# Patient Record
Sex: Female | Born: 2000 | Hispanic: No | Marital: Married | Smoking: Never smoker
Health system: Southern US, Community
[De-identification: ages and names within clinical notes are randomized; demographics above are authoritative.]

## PROBLEM LIST (undated history)

## (undated) DIAGNOSIS — R519 Headache, unspecified: Secondary | ICD-10-CM

## (undated) HISTORY — PX: WISDOM TOOTH EXTRACTION: SHX21

---
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
## 2020-01-14 ENCOUNTER — Ambulatory Visit: Payer: Self-pay | Admitting: Family Medicine

## 2020-01-28 ENCOUNTER — Other Ambulatory Visit: Payer: Self-pay

## 2020-01-28 ENCOUNTER — Encounter: Payer: Self-pay | Admitting: Family Medicine

## 2020-01-28 ENCOUNTER — Ambulatory Visit (INDEPENDENT_AMBULATORY_CARE_PROVIDER_SITE_OTHER): Payer: PRIVATE HEALTH INSURANCE | Admitting: Family Medicine

## 2020-01-28 VITALS — BP 106/73 | HR 90 | Ht 60.0 in | Wt 157.0 lb

## 2020-01-28 DIAGNOSIS — M25562 Pain in left knee: Secondary | ICD-10-CM | POA: Diagnosis not present

## 2020-01-28 DIAGNOSIS — Z0000 Encounter for general adult medical examination without abnormal findings: Secondary | ICD-10-CM | POA: Diagnosis not present

## 2020-01-28 DIAGNOSIS — G43709 Chronic migraine without aura, not intractable, without status migrainosus: Secondary | ICD-10-CM | POA: Diagnosis not present

## 2020-01-28 DIAGNOSIS — M25561 Pain in right knee: Secondary | ICD-10-CM

## 2020-01-28 DIAGNOSIS — G8929 Other chronic pain: Secondary | ICD-10-CM | POA: Insufficient documentation

## 2020-01-28 NOTE — Progress Notes (Signed)
Office Visit Note   Patient: Kathryn Anderson           Date of Birth: 02/22/2001           MRN: [id number] Visit Date: 01/28/2020 Requested by: No referring provider defined for this encounter. PCP: Kathryn Mesi, MD  Subjective: Chief Complaint  Patient presents with   establish primary care    HPI: She is here for a wellness exam.  She and her parents moved here from [place].  Her father works for [employer].  She has a history of migraines for the past 4 to 5 years.  She has them about four or five times a month.  She takes ibuprofen occasionally, he usually she can sleep them off.  She has not noticed any dietary triggers.  She takes a vitamin supplement for migraines.  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  She has chronic bilateral knee pain, right greater than left.  Pain is anterior, when she squats down or comes back up again.  No locking or giving way.  Symptoms for at least a year.  She has had at least one concussion, she fully recovered from it.  Family history was reviewed in detail.  She has not yet been to a gynecologist.                ROS:   All other systems were reviewed and are negative.  Objective: Vital Signs: BP 106/73    Pulse 90    Ht 5' (1.524 m)    Wt 157 lb (71.2 kg)    BMI 30.66 kg/m   Physical Exam:  General:  Alert and oriented, in no acute distress. Pulm:  Breathing unlabored. Psy:  Normal mood, congruent affect.  HEENT:  [place]/AT, PERRLA, EOM Full, no nystagmus.  Funduscopic examination within normal limits.  No conjunctival erythema.  Tympanic membranes are pearly gray with normal landmarks.  External ear canals are normal.  Nasal passages are clear.  Oropharynx is clear.  No significant lymphadenopathy.  No thyromegaly or nodules.  2+ carotid pulses without bruits. CV: Regular rate and rhythm without murmurs, rubs, or gallops.  No peripheral edema.  2+ radial and posterior tibial  pulses. Lungs: Clear to auscultation throughout with no wheezing or areas of consolidation. Abd: Bowel sounds are active, no hepatosplenomegaly or masses.  Soft and nontender.  No audible bruits.  No evidence of ascites. Extremities: 2+ upper and lower DTRs.  No nail deformities.  Slightly wide bilateral to ankles, no pain with internal hip rotation.  Hypermobile patella on both sides with 1+ patellofemoral crepitus in both knees.  No effusion, ligaments are stable.  Feet have hyperpronation with pes planus.    Imaging: No results found.  Assessment & Plan: 1.  Wellness exam -Gynecology referral at age 19. -Labs if she starts having more frequent migraines.  2.  Migraine headaches -Food diary, consider low carbohydrate/ketogenic diet.  3.  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  Arch supports in her shoes.  Physical therapy if symptoms worsen.     Procedures: No procedures performed  No notes on file     PMFS History: Patient Active Problem List   Diagnosis Date Noted   Chronic migraine without aura without status migrainosus, not intractable 01/28/2020   Chronic pain of both knees 01/28/2020   Ankle ligament laxity, left 12/19/2019   History reviewed. No pertinent past medical  history.  Family History  Problem Relation Age of Onset   Healthy Mother    Healthy Father    Macular degeneration Maternal Grandmother    Glaucoma Maternal Grandmother    Dementia Paternal Grandfather    Heart disease Neg Hx     History reviewed. No pertinent surgical history. Social History   Occupational History   Not on file  Tobacco Use   Smoking status: Never Smoker   Smokeless tobacco: Never Used  Substance and Sexual Activity   Alcohol use: Never   Drug use: Never   Sexual activity: Not on file

## 2020-02-03 ENCOUNTER — Ambulatory Visit: Payer: PRIVATE HEALTH INSURANCE | Admitting: Physical Therapy

## 2020-03-02 ENCOUNTER — Ambulatory Visit: Payer: PRIVATE HEALTH INSURANCE | Attending: Orthopaedic Surgery | Admitting: Physical Therapy

## 2020-03-02 ENCOUNTER — Other Ambulatory Visit: Payer: Self-pay

## 2020-03-02 DIAGNOSIS — M25572 Pain in left ankle and joints of left foot: Secondary | ICD-10-CM | POA: Diagnosis not present

## 2020-03-02 DIAGNOSIS — R29898 Other symptoms and signs involving the musculoskeletal system: Secondary | ICD-10-CM | POA: Diagnosis present

## 2020-03-02 DIAGNOSIS — M25672 Stiffness of left ankle, not elsewhere classified: Secondary | ICD-10-CM | POA: Diagnosis present

## 2020-03-02 DIAGNOSIS — M6281 Muscle weakness (generalized): Secondary | ICD-10-CM | POA: Insufficient documentation

## 2020-03-02 NOTE — Patient Instructions (Signed)
    Home exercise program created by Marion Seese, PT.  For questions, please contact Devontre Siedschlag via phone at [phone] or email at [email or web address]  [facility] [place] [address]  [address] [place], [place], [address] Phone: [phone]   Fax:  [phone]    

## 2020-03-02 NOTE — Therapy (Addendum)
[facility] [address]  [address] [place], [place], [address] Phone: [phone]   Fax:  [phone]  Physical Therapy Evaluation  Patient Details  Name: Kathryn Anderson MRN: [id number] Date of Birth: 2001/03/07 Referring Provider (PT): Norlene Campbell, MD   Encounter Date: 03/02/2020   PT End of Session - 03/02/20 1532    Visit Number 1    Number of Visits 8    Date for PT Re-Evaluation 03/30/20    Authorization Type [employer]    PT Start Time 1532    PT Stop Time 1622    PT Time Calculation (min) 50 min    Activity Tolerance Patient tolerated treatment well    Behavior During Therapy [facility] for tasks assessed/performed           No past medical history on file.  No past surgical history on file.  There were no vitals filed for this visit.    Subjective Assessment - 03/02/20 1534    Subjective Pt reports problems with L ankle - in past 4 yrs, she has sprained her ankle ~8 times with most recent sprain 3-4 months ago. Sprains have typically occurred while playing volleyball (currently plays 3x/wk) - usually indoors but occasionally on sand. Majority if not all sprains into inversion/supination. No pain currently but notes weakness in L ankle and popping on L single leg heel raise.    Patient Stated Goals "to keep from spraining her L ankle"    Currently in Pain? 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  No    Is the patient reluctant to leave their home because of a fear of falling?  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    Leisure volleyball - 3x/wk; gym 3-4x/wk (cardio & [employer]); play violin      Cognition   Overall Cognitive Status Within Functional Limits for tasks assessed      Observation/Other Assessments   Focus on Therapeutic Outcomes (FOTO)  Ankle - 72% (28% limitation); Predicted 84% (16% limitation)      Sensation   Light Touch Appears Intact      ROM / Strength   AROM / PROM / Strength AROM;PROM;Strength      AROM   AROM  Assessment Site Ankle    Right/Left Ankle Right;Left    Right Ankle Dorsiflexion 10    Right Ankle Plantar Flexion 64    Right Ankle Inversion 37    Right Ankle Eversion 13    Left Ankle Dorsiflexion 11    Left Ankle Plantar Flexion 55    Left Ankle Inversion 35    Left Ankle Eversion 10      PROM   PROM Assessment Site Ankle    Right/Left Ankle Right;Left    Right Ankle Eversion 23    Left Ankle Eversion 15      Strength   Strength Assessment Site Ankle;Knee;Hip    Right/Left Hip Right;Left    Right Hip Flexion 5/5    Right Hip Extension 5/5    Right Hip External Rotation  4+/5    Right Hip Internal Rotation 4+/5    Right Hip ABduction 5/5    Right Hip ADduction 5/5    Left Hip Flexion 4+/5    Left Hip Extension 4+/5    Left Hip External Rotation 4/5    Left Hip Internal Rotation 4+/5    Left Hip ABduction 4/5    Left Hip ADduction 4/5    Right/Left Knee Right;Left    Right Knee Flexion 5/5    Right Knee Extension 4+/5   pain at  patellar tendon   Left Knee Flexion 5/5    Left Knee Extension 4+/5   pain at patellar tendon   Right/Left Ankle Right;Left    Right Ankle Dorsiflexion 4/5    Right Ankle Plantar Flexion 5/5    Right Ankle Inversion 5/5    Right Ankle Eversion 5/5    Left Ankle Dorsiflexion 4-/5    Left Ankle Plantar Flexion 3+/5   "popping"   Left Ankle Inversion 4-/5    Left Ankle Eversion 3+/5      Flexibility   Soft Tissue Assessment /Muscle Length yes    Hamstrings mild tight B    Quadriceps WFL    ITB mild tight L    Piriformis WFL                      Objective measurements completed on examination: See above findings.               PT Education - 03/02/20 1620    Education Details PT eval findings, anticipated POC & initial HEP    Person(s) Educated Patient    Methods Explanation;Demonstration;Verbal cues;Handout    Comprehension Verbalized understanding;Verbal cues required;Returned demonstration;Need further instruction               PT Long Term Goals - 03/02/20 1622      PT LONG TERM GOAL #1   Title Patient will be independent with ongoing/advanced HEP +/- gym program for self-management at home    Status New    Target Date 03/30/20      PT LONG TERM GOAL #2   Title Patient will demonstrate improved L ankle & proximal LE strength to >/= 5-/5 for improved stability and ease of mobility    Status New    Target Date 03/30/20      PT LONG TERM GOAL #3   Title Patient will report no limitation with floor transfers at work due L ankle laxity/weakness    Status New    Target Date 03/30/20      PT LONG TERM GOAL #4   Title Patient will report no further instances of  L ankle instability/sprains while playing volleyball    Status New    Target Date 03/30/20                  Plan - 03/02/20 1622    Clinical Impression Statement [name] is a 19 y/o female who presents to OP PT for chronic L ankle ligament laxity due h/o multiple ankle  sprains. She reports ~8 ankle sprains in past 4 years, with 2 in the last 6 months. She plays volleyball 3x/wk and states that her sprains usually have resulted from landing wrong while jumping. She dose wear an ankle brace while playing while playing volleyball. She has no pain currently, but most recent sprain was ~3 months ago. Deficits include L ankle inversion and anterior drawer laxity, mildly limited eversion ROM, pes planus in weightbearing and mild to moderate L>R ankle weakness as well as proximal LE weakness. 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 Leili will benefit from skilled PT to restore functional ROM and strength in L ankle as well as improved proprioception to reduce the risk for future ankle sprains.    Personal Factors and Comorbidities Time since onset of injury/illness/exacerbation;Comorbidity 2;Past/Current Experience    Comorbidities Chronic B knee pain, chronic migraines    Examination-Activity Limitations Locomotion Level;Stairs    Examination-Participation Restrictions Community Activity;Occupation    Stability/Clinical Decision Making Stable/Uncomplicated    Clinical Decision Making Low    Rehab Potential Excellent    PT Frequency 2x / week    PT Duration 4 weeks    PT Treatment/Interventions ADLs/Self Care Home Management;Cryotherapy;Electrical Stimulation;Iontophoresis 4mg /ml Dexamethasone;Moist Heat;Ultrasound;Gait training;Stair training;Functional mobility training;Therapeutic activities;Therapeutic exercise;Balance training;Neuromuscular re-education;Patient/family education;Manual techniques;Passive range of motion;Dry needling;Taping;Vasopneumatic Device;Joint Manipulations    PT Next Visit Plan review initial HEP; progress L ankle strengthening and proprioceptive training    Consulted and Agree with  Plan of Care Patient           Patient will benefit from skilled therapeutic intervention in order to improve the following deficits and impairments:  Decreased activity tolerance,Decreased balance,Decreased endurance,Decreased mobility,Decreased range of motion,Decreased strength,Difficulty walking,Hypermobility,Increased fascial restricitons,Increased muscle spasms,Impaired perceived functional ability,Impaired flexibility,Improper body mechanics,Postural dysfunction,Pain  Visit Diagnosis: Pain in left ankle and joints of left foot - Plan: PT plan of care cert/re-cert  Muscle weakness (generalized) - Plan: PT plan of care cert/re-cert  Other symptoms and signs involving the musculoskeletal system - Plan: PT plan of care cert/re-cert  Stiffness of left ankle, not elsewhere classified - Plan: PT plan of care cert/re-cert     Problem List Patient Active Problem List   Diagnosis Date Noted  . Chronic migraine without aura without status migrainosus, not intractable 01/28/2020  . Chronic pain of both knees 01/28/2020  . Ankle ligament laxity, left 12/19/2019    12/21/2019, PT, MPT 03/02/2020, 7:06 PM  [facility] [address]  [address] [place], [place], [place] Phone: [phone]   Fax:  [phone]  Name: Payeton Germani MRN: Erma [id number] Date of Birth: 09-03-2000

## 2020-03-09 ENCOUNTER — Ambulatory Visit: Payer: Commercial Managed Care - PPO | Attending: Orthopaedic Surgery

## 2020-03-09 DIAGNOSIS — M25672 Stiffness of left ankle, not elsewhere classified: Secondary | ICD-10-CM | POA: Insufficient documentation

## 2020-03-09 DIAGNOSIS — R29898 Other symptoms and signs involving the musculoskeletal system: Secondary | ICD-10-CM | POA: Insufficient documentation

## 2020-03-09 DIAGNOSIS — M25572 Pain in left ankle and joints of left foot: Secondary | ICD-10-CM | POA: Insufficient documentation

## 2020-03-09 DIAGNOSIS — M6281 Muscle weakness (generalized): Secondary | ICD-10-CM | POA: Insufficient documentation

## 2020-03-16 ENCOUNTER — Encounter: Payer: Self-pay | Admitting: Physical Therapy

## 2020-03-23 ENCOUNTER — Ambulatory Visit: Payer: Commercial Managed Care - PPO

## 2020-03-30 ENCOUNTER — Encounter: Payer: Self-pay | Admitting: Physical Therapy

## 2020-03-30 ENCOUNTER — Ambulatory Visit: Payer: Commercial Managed Care - PPO | Admitting: Physical Therapy

## 2020-03-30 ENCOUNTER — Other Ambulatory Visit: Payer: Self-pay

## 2020-03-30 DIAGNOSIS — M6281 Muscle weakness (generalized): Secondary | ICD-10-CM

## 2020-03-30 DIAGNOSIS — R29898 Other symptoms and signs involving the musculoskeletal system: Secondary | ICD-10-CM

## 2020-03-30 DIAGNOSIS — M25572 Pain in left ankle and joints of left foot: Secondary | ICD-10-CM

## 2020-03-30 DIAGNOSIS — M25672 Stiffness of left ankle, not elsewhere classified: Secondary | ICD-10-CM | POA: Diagnosis present

## 2020-03-30 NOTE — Therapy (Signed)
[facility] [place] [address]  [place], [place], [address] Phone: [phone]   Fax:  [phone]  Physical Therapy Treatment / Recert  Patient Details  Name: Kathryn Anderson MRN: [id number] Date of Birth: [birth year] Referring Provider (PT): Kathryn Fears, MD   Encounter Date: 03/30/2020   PT End of Session - 03/30/20 1530    Visit Number 2    Number of Visits 8    Date for PT Re-Evaluation 05/25/20    Authorization Type [employer]    PT Start Time 1530    PT Stop Time 1612    PT Time Calculation (min) 42 min    Activity Tolerance Patient tolerated treatment well    Behavior During Therapy [facility] for tasks assessed/performed           History reviewed. No pertinent past medical history.  History reviewed. No pertinent surgical history.  There were no vitals filed for this visit.   Subjective Assessment - 03/30/20 1534    Subjective Kathryn Anderson returning for the first time since the eval due to testing positive for COVID and the the clinic was closed last week due to inclement weather. Pt notes some soreness after playing in a volleyball tournament.She denies any issues with the intial HEP, stating these exercises have gotten pretty easy.    Patient Stated Goals "to keep from spraining her L ankle"    Currently in Pain? 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    Leisure volleyball - 3x/wk; gym 3-4x/wk (cardio &  weights); play violin      Observation/Other Assessments   Focus on Therapeutic Outcomes (FOTO)  Ankle - 72% (28% limitation); Predicted 84% (16% limitation)   per initial eval assessment on 03/02/20     AROM   Left Ankle Dorsiflexion 11    Left Ankle Plantar Flexion 54    Left Ankle Inversion 32    Left Ankle Eversion 14      PROM   Left Ankle Eversion 19      Strength   Right Hip Flexion 5/5    Right Hip Extension 5/5    Right Hip External Rotation  4+/5    Right Hip Internal Rotation 5/5    Right Hip ABduction 5/5    Right Hip ADduction 5/5    Left Hip Flexion 4+/5    Left Hip Extension 4+/5    Left Hip External Rotation 4/5    Left Hip Internal Rotation 4+/5    Left Hip ABduction 4/5    Left Hip ADduction 4/5    Right Knee Flexion 5/5    Right Knee Extension 4+/5   pain at patellar tendon   Left Knee Flexion 4+/5    Left Knee Extension 4+/5   pain at patellar tendon  Right Ankle Dorsiflexion 4+/5    Right Ankle Plantar Flexion 5/5    Right Ankle Inversion 5/5    Right Ankle Eversion 5/5    Left Ankle Dorsiflexion 4+/5    Left Ankle Plantar Flexion 4/5   "popping" - worsening with increased reps of L SLS heel raises   Left Ankle Inversion 4/5    Left Ankle Eversion 4/5                         [facility] Adult PT Treatment/Exercise - 03/30/20 1530      Exercises   Exercises Ankle      Knee/Hip Exercises: Standing   Hip Flexion Left;Right;10 reps;Stengthening;Knee straight    Hip Flexion Limitations green TB at ankle; intermittent 1 pole A with L SLS    Hip ADduction Left;Right;10 reps;Strengthening    Hip ADduction Limitations green TB at ankle; intermittent 1 pole A with L SLS    Hip Abduction Left;Right;10 reps;Stengthening;Knee straight    Abduction Limitations green TB at ankle; intermittent 1 pole A with L SLS    Hip Extension Left;Right;10 reps;Stengthening;Knee straight    Extension Limitations green TB at ankle; intermittent 1 pole A with L  SLS      Ankle Exercises: Aerobic   Recumbent Bike L3 x 6 min      Ankle Exercises: Seated   Other Seated Ankle Exercises L ankle 4-way with green TB x 10                  PT Education - 03/30/20 1610    Education Details HEP progression/update - ankle progressed to green TB, hip strengthening & proprioceptive training added    Person(s) Educated Patient    Methods Explanation;Demonstration;Verbal cues;Handout    Comprehension Verbalized understanding;Verbal cues required;Returned demonstration;Need further instruction               PT Long Term Goals - 03/30/20 1537      PT LONG TERM GOAL #1   Title Patient will be independent with ongoing/advanced HEP +/- gym program for self-management at home    Status Partially Met    Target Date 05/25/20      PT LONG TERM GOAL #2   Title Patient will demonstrate improved L ankle & proximal LE strength to >/= 5-/5 for improved stability and ease of mobility    Status On-going    Target Date 05/25/20      PT LONG TERM GOAL #3   Title Patient will report no limitation with floor transfers at work due L ankle laxity/weakness    Status On-going    Target Date 05/25/20      PT LONG TERM GOAL #4   Title Patient will report no further instances of L ankle instability/sprains while playing volleyball    Status On-going    Target Date 05/25/20                 Plan - 03/30/20 1537    Clinical Impression Statement Kathryn Anderson returning for first visit since eval due to testing positive for COVID then missing last week due to inclement weather. She reports good compliance with HEP with ankle strength demonstrating some improvement and pt able to progress to green TB resistance today. Therapeutic exercises/HEP advanced to include proximal strengthening and proprioceptive training. Initial POC due to expire today but given inability to return to PT since eval until today, goals not met therefore will recommend recertification for up  to an additional 8 visits with frequency reduced to 1x/wk per patient's availability to attend PT.    Comorbidities Chronic B knee pain, chronic migraines    Rehab Potential Excellent    PT Frequency 1x / week   pt only able to come 1x/wk   PT Duration 8 weeks    PT Treatment/Interventions ADLs/Self Care Home Management;Cryotherapy;Electrical Stimulation;Iontophoresis 57m/ml Dexamethasone;Moist Heat;Ultrasound;Gait training;Stair training;Functional mobility training;Therapeutic activities;Therapeutic exercise;Balance training;Neuromuscular re-education;Patient/family education;Manual techniques;Passive range of motion;Dry needling;Taping;Vasopneumatic Device;Joint Manipulations    PT Next Visit Plan progress L ankle & proximal LE strengthening and proprioceptive training    Consulted and Agree with Plan of Care Patient           Patient will benefit from skilled therapeutic intervention in order to improve the following deficits and impairments:  Decreased activity tolerance,Decreased balance,Decreased endurance,Decreased mobility,Decreased range of motion,Decreased strength,Difficulty walking,Hypermobility,Increased fascial restricitons,Increased muscle spasms,Impaired perceived functional ability,Impaired flexibility,Improper body mechanics,Postural dysfunction,Pain  Visit Diagnosis: Pain in left ankle and joints of left foot  Muscle weakness (generalized)  Other symptoms and signs involving the musculoskeletal system  Stiffness of left ankle, not elsewhere classified     Problem List Patient Active Problem List   Diagnosis Date Noted  . Chronic migraine without aura without status migrainosus, not intractable 01/28/2020  . Chronic pain of both knees 01/28/2020  . Ankle ligament laxity, left 12/19/2019    JPercival Spanish PT, MPT 03/30/2020, 6:39 PM  [facility] [place] [address]Phone:  [phone]  Fax:  [phone] Name: ATeddie CurdMRN: [id number] of Birth: 208/29/2002

## 2020-03-30 NOTE — Patient Instructions (Signed)
    Home exercise program created by Kathryn Anderson, PT.  For questions, please contact Kathryn Anderson via phone at [phone] or email at [email or web address]  [facility] [address]  [address] [place], [place], [address] Phone: [phone]   Fax:  [phone]    

## 2020-04-13 ENCOUNTER — Ambulatory Visit: Payer: Commercial Managed Care - PPO | Admitting: Physical Therapy

## 2020-04-14 ENCOUNTER — Other Ambulatory Visit: Payer: Self-pay

## 2020-04-14 ENCOUNTER — Ambulatory Visit (HOSPITAL_COMMUNITY)
Admission: EM | Admit: 2020-04-14 | Discharge: 2020-04-14 | Disposition: A | Discharge status: Home / Self Care | Payer: PRIVATE HEALTH INSURANCE

## 2020-04-15 ENCOUNTER — Other Ambulatory Visit: Payer: Self-pay

## 2020-04-15 ENCOUNTER — Emergency Department (HOSPITAL_COMMUNITY): Payer: Commercial Managed Care - PPO

## 2020-04-15 ENCOUNTER — Encounter: Payer: Self-pay | Admitting: Family Medicine

## 2020-04-15 ENCOUNTER — Inpatient Hospital Stay (HOSPITAL_COMMUNITY)
Admission: EM | Admit: 2020-04-15 | Discharge: 2020-04-18 | DRG: 864 | Disposition: A | Discharge status: Home / Self Care | Payer: Commercial Managed Care - PPO | Attending: Internal Medicine | Admitting: Internal Medicine

## 2020-04-15 ENCOUNTER — Encounter (HOSPITAL_COMMUNITY): Payer: Self-pay

## 2020-04-15 DIAGNOSIS — M542 Cervicalgia: Secondary | ICD-10-CM | POA: Diagnosis present

## 2020-04-15 DIAGNOSIS — J329 Chronic sinusitis, unspecified: Secondary | ICD-10-CM | POA: Diagnosis present

## 2020-04-15 DIAGNOSIS — Z20822 Contact with and (suspected) exposure to covid-19: Secondary | ICD-10-CM | POA: Diagnosis present

## 2020-04-15 DIAGNOSIS — G003 Staphylococcal meningitis: Secondary | ICD-10-CM | POA: Diagnosis not present

## 2020-04-15 DIAGNOSIS — G009 Bacterial meningitis, unspecified: Secondary | ICD-10-CM | POA: Diagnosis present

## 2020-04-15 DIAGNOSIS — G43909 Migraine, unspecified, not intractable, without status migrainosus: Secondary | ICD-10-CM | POA: Diagnosis present

## 2020-04-15 DIAGNOSIS — Z8616 Personal history of COVID-19: Secondary | ICD-10-CM

## 2020-04-15 DIAGNOSIS — R519 Headache, unspecified: Secondary | ICD-10-CM | POA: Diagnosis present

## 2020-04-15 DIAGNOSIS — Y92239 Unspecified place in hospital as the place of occurrence of the external cause: Secondary | ICD-10-CM | POA: Diagnosis present

## 2020-04-15 DIAGNOSIS — D649 Anemia, unspecified: Secondary | ICD-10-CM | POA: Diagnosis present

## 2020-04-15 DIAGNOSIS — E876 Hypokalemia: Secondary | ICD-10-CM | POA: Diagnosis present

## 2020-04-15 DIAGNOSIS — L299 Pruritus, unspecified: Secondary | ICD-10-CM | POA: Diagnosis present

## 2020-04-15 DIAGNOSIS — R509 Fever, unspecified: Principal | ICD-10-CM | POA: Diagnosis present

## 2020-04-15 DIAGNOSIS — R059 Cough, unspecified: Secondary | ICD-10-CM | POA: Diagnosis present

## 2020-04-15 DIAGNOSIS — T368X5A Adverse effect of other systemic antibiotics, initial encounter: Secondary | ICD-10-CM | POA: Diagnosis present

## 2020-04-15 DIAGNOSIS — R3589 Other polyuria: Secondary | ICD-10-CM | POA: Diagnosis present

## 2020-04-15 DIAGNOSIS — Z79899 Other long term (current) drug therapy: Secondary | ICD-10-CM

## 2020-04-15 LAB — COMPREHENSIVE METABOLIC PANEL
ALT: 19 U/L (ref 0–44)
AST: 9 U/L — ABNORMAL LOW (ref 15–41)
Albumin: 4.5 g/dL (ref 3.5–5.0)
Alkaline Phosphatase: 55 U/L (ref 38–126)
Anion gap: 12 (ref 5–15)
BUN: 8 mg/dL (ref 6–20)
CO2: 24 mmol/L (ref 22–32)
Calcium: 9.2 mg/dL (ref 8.9–10.3)
Chloride: 102 mmol/L (ref 98–111)
Creatinine, Ser: 0.71 mg/dL (ref 0.44–1.00)
GFR, Estimated: >60 mL/min (ref >60)
Glucose, Bld: 96 mg/dL (ref 70–99)
Potassium: 4.1 mmol/L (ref 3.5–5.1)
Sodium: 138 mmol/L (ref 135–145)
Total Bilirubin: 0.7 mg/dL (ref 0.3–1.2)
Total Protein: 7.9 g/dL (ref 6.5–8.1)

## 2020-04-15 LAB — URINALYSIS, ROUTINE W REFLEX MICROSCOPIC
Bilirubin Urine: NEGATIVE
Glucose, UA: NEGATIVE mg/dL
Hgb urine dipstick: NEGATIVE
Ketones, ur: 5 mg/dL — AB
Leukocytes,Ua: NEGATIVE
Nitrite: NEGATIVE
Protein, ur: NEGATIVE mg/dL
Specific Gravity, Urine: 1.019 (ref 1.005–1.030)
pH: 5 (ref 5.0–8.0)

## 2020-04-15 LAB — RESP PANEL BY RT-PCR (FLU A&B, COVID) ARPGX2
Influenza A by PCR: NEGATIVE
Influenza B by PCR: NEGATIVE
SARS Coronavirus 2 by RT PCR: NEGATIVE

## 2020-04-15 LAB — CBC WITH DIFFERENTIAL/PLATELET
Abs Immature Granulocytes: 0.03 10*3/uL (ref 0.00–0.07)
Basophils Absolute: 0 10*3/uL (ref 0.0–0.1)
Basophils Relative: 0 %
Eosinophils Absolute: 0 10*3/uL (ref 0.0–0.5)
Eosinophils Relative: 0 %
HCT: 39 % (ref 36.0–46.0)
Hemoglobin: 12.9 g/dL (ref 12.0–15.0)
Immature Granulocytes: 0 %
Lymphocytes Relative: 24 %
Lymphs Abs: 1.6 10*3/uL (ref 0.7–4.0)
MCH: 28.4 pg (ref 26.0–34.0)
MCHC: 33.1 g/dL (ref 30.0–36.0)
MCV: 85.9 fL (ref 80.0–100.0)
Monocytes Absolute: 0.7 10*3/uL (ref 0.1–1.0)
Monocytes Relative: 11 %
Neutro Abs: 4.4 10*3/uL (ref 1.7–7.7)
Neutrophils Relative %: 65 %
Platelets: 308 10*3/uL (ref 150–400)
RBC: 4.54 MIL/uL (ref 3.87–5.11)
RDW: 12.2 % (ref 11.5–15.5)
WBC: 6.8 10*3/uL (ref 4.0–10.5)
nRBC: 0 % (ref 0.0–0.2)

## 2020-04-15 LAB — I-STAT BETA HCG BLOOD, ED (NOT ORDERABLE): I-stat hCG, quantitative: <5 m[IU]/mL (ref <5)

## 2020-04-15 LAB — LACTIC ACID, PLASMA: Lactic Acid, Venous: 0.9 mmol/L (ref 0.5–1.9)

## 2020-04-15 MED ORDER — SODIUM CHLORIDE 0.9 % IV BOLUS
1000.0000 mL | Freq: Once | INTRAVENOUS | Status: AC
  Administered 2020-04-15: 1000 mL via INTRAVENOUS

## 2020-04-15 MED ORDER — IBUPROFEN 200 MG PO TABS
600.0000 mg | ORAL_TABLET | Freq: Once | ORAL | Status: AC
  Administered 2020-04-15: 600 mg via ORAL
  Filled 2020-04-15: qty 3

## 2020-04-15 MED ORDER — SODIUM CHLORIDE 0.9 % IV SOLN
2.0000 g | Freq: Once | INTRAVENOUS | Status: AC
  Administered 2020-04-16: 2 g via INTRAVENOUS
  Filled 2020-04-15: qty 20

## 2020-04-15 MED ORDER — ACETAMINOPHEN 325 MG PO TABS
650.0000 mg | ORAL_TABLET | Freq: Once | ORAL | Status: AC | PRN
  Administered 2020-04-15: 650 mg via ORAL
  Filled 2020-04-15: qty 2

## 2020-04-15 MED ORDER — IBUPROFEN 200 MG PO TABS: 600.0000 mg | ORAL_TABLET | Freq: Once | ORAL | Status: DC

## 2020-04-15 NOTE — ED Provider Notes (Signed)
[facility]-EMERGENCY DEPT Provider Note   CSN: [id number] Arrival date & time: 04/15/20  1704     History Chief Complaint  Patient presents with  . Fever  . Neck Pain    Kathryn Anderson is a 20 y.o. female.  Patient presents to ER chief complaint of fever cough and neck pain.  Cough has been going on and off for 4 to 5 days described as very mild.  Fever neck pain started yesterday.  Describes as aching and persistent worse when she moves her head a certain way.  Denies vomiting or diarrhea denies chest pain.  Denies any shortness of breath.  Patient was not vaccinated for Covid, but had contracted Covid in 2020.        History reviewed. No pertinent past medical history.  Patient Active Problem List   Diagnosis Date Noted  . Chronic migraine without aura without status migrainosus, not intractable 01/28/2020  . Chronic pain of both knees 01/28/2020  . Ankle ligament laxity, left 12/19/2019    History reviewed. No pertinent surgical history.   OB History   No obstetric history on file.     Family History  Problem Relation Age of Onset  . Healthy Mother   . Healthy Father   . Macular degeneration Maternal Grandmother   . Glaucoma Maternal Grandmother   . Dementia Paternal Grandfather   . Heart disease Neg Hx     Social History   Tobacco Use  . Smoking status: Never Smoker  . Smokeless tobacco: Never Used  Substance Use Topics  . Alcohol use: Never  . Drug use: Never    Home Medications Prior to Admission medications   Medication Sig Start Date End Date Taking? Authorizing Provider  cholecalciferol (VITAMIN D3) 25 MCG (1000 UNIT) tablet Take 1,000 Units by mouth daily.   Yes [provider]  ibuprofen (ADVIL) 200 MG tablet Take 200 mg by mouth every 6 (six) hours as needed for headache or mild pain.   Yes [provider]  magnesium 30 MG tablet Take 30 mg by mouth at bedtime.   Yes [provider]  vitamin  C (ASCORBIC ACID) 500 MG tablet Take 500 mg by mouth daily.   Yes [provider]  amoxicillin (AMOXIL) 875 MG tablet  01/28/19   [provider]    Allergies    Patient has no known allergies.  Review of Systems   Review of Systems  Constitutional: Positive for fever.  HENT: Negative for ear pain.   Eyes: Negative for pain.  Respiratory: Positive for cough.   Cardiovascular: Negative for chest pain.  Gastrointestinal: Negative for abdominal pain.  Genitourinary: Negative for flank pain.  Musculoskeletal: Negative for back pain.  Skin: Negative for rash.  Neurological: Positive for headaches.    Physical Exam Updated Vital Signs BP 118/74   Pulse (!) 101   Temp (!) 102.2 F (39 C) (Oral)   Resp 18   SpO2 95%   Physical Exam Constitutional:      General: She is not in acute distress.    Appearance: Normal appearance.  HENT:     Head: Normocephalic.     Nose: Nose normal.  Eyes:     Extraocular Movements: Extraocular movements intact.  Cardiovascular:     Rate and Rhythm: Normal rate.  Pulmonary:     Effort: Pulmonary effort is normal.  Musculoskeletal:        General: Normal range of motion.     Cervical back:  Rigidity present.  Neurological:     General: No focal deficit present.     Mental Status: She is alert and oriented to person, place, and time. Mental status is at baseline.     Cranial Nerves: No cranial nerve deficit.     Motor: No weakness.     Gait: Gait normal.     ED Results / Procedures / Treatments   Labs (all labs ordered are listed, but only abnormal results are displayed) Labs Reviewed  COMPREHENSIVE METABOLIC PANEL - Abnormal; Notable for the following components:      Result Value   AST 9 (*)    All other components within normal limits  URINALYSIS, ROUTINE W REFLEX MICROSCOPIC - Abnormal; Notable for the following components:   Ketones, ur 5 (*)    All other components within normal limits  RESP PANEL BY RT-PCR  (FLU A&B, COVID) ARPGX2  CSF CULTURE  LACTIC ACID, PLASMA  CBC WITH DIFFERENTIAL/PLATELET  LACTIC ACID, PLASMA  CSF CELL COUNT WITH DIFFERENTIAL  GLUCOSE, CSF  PROTEIN, CSF  VDRL, CSF  I-STAT BETA HCG BLOOD, ED ([facility], [facility], [facility] ONLY)  I-STAT BETA HCG BLOOD, ED (NOT ORDERABLE)    EKG None  Radiology DG Chest 2 View  Result Date: 04/15/2020 CLINICAL DATA:  Headache since yesterday, fever EXAM: CHEST - 2 VIEW COMPARISON:  None. FINDINGS: Frontal and lateral views of the chest demonstrate an unremarkable cardiac silhouette. No airspace disease, effusion, or pneumothorax. No acute bony abnormality. IMPRESSION: 1. No acute intrathoracic process. Electronically Signed   By: Sharlet Salina M.D.   On: 04/15/2020 17:58    Procedures .Lumbar Puncture  Date/Time: 04/15/2020 10:59 PM Performed by: Cheryll Cockayne, MD Authorized by: Cheryll Cockayne, MD   Consent:    Consent obtained:  Verbal   Consent given by:  Patient and parent   Risks discussed:  Bleeding, infection, headache, nerve damage, pain and repeat procedure   Alternatives discussed:  No treatment, delayed treatment, alternative treatment, observation and referral Procedure details:    Number of attempts:  2   Fluid appearance:  Clear   Tubes of fluid:  3 Comments:     Site was prepped with Betadine x3.  22-gauge spinal needle was used.  2 attempts were made.  On second attempt I had return of clear appearing CSF fluid which was sent to the lab for analysis.  Needle was then withdrawn no active bleeding was seen and dressing was placed on aspiration site.     Medications Ordered in ED Medications  acetaminophen (TYLENOL) tablet 650 mg (650 mg Oral Given 04/15/20 1819)  sodium chloride 0.9 % bolus 1,000 mL (0 mLs Intravenous Stopped 04/15/20 2243)  ibuprofen (ADVIL) tablet 600 mg (600 mg Oral Given 04/15/20 2017)    ED Course  I have reviewed the triage vital signs and the nursing notes.  Pertinent labs & imaging results that were  available during my care of the patient were reviewed by me and considered in my medical decision making (see chart for details).    MDM Rules/Calculators/A&P                          Lab test unremarkable.  Covid sent and pending.  I discussed with patient and her mother risks and benefits regarding spinal tap for meningitis.  All questions were answered.    Final Clinical Impression(s) / ED Diagnoses Final diagnoses:  Febrile illness  Neck pain  Rx / DC Orders ED Discharge Orders    None       Cheryll Cockayne, MD 04/15/20 [phone]

## 2020-04-15 NOTE — ED Triage Notes (Addendum)
Pt presents with c/o fever and neck pain since yesterday. Pt reports his highest fever at home was 105. Pt also c/o neck pain.

## 2020-04-16 ENCOUNTER — Encounter (HOSPITAL_COMMUNITY): Payer: Self-pay | Admitting: Internal Medicine

## 2020-04-16 DIAGNOSIS — G009 Bacterial meningitis, unspecified: Secondary | ICD-10-CM

## 2020-04-16 DIAGNOSIS — T368X5A Adverse effect of other systemic antibiotics, initial encounter: Secondary | ICD-10-CM

## 2020-04-16 LAB — CSF CELL COUNT WITH DIFFERENTIAL
RBC Count, CSF: 0 /cu mm
RBC Count, CSF: 1 /mm3 — ABNORMAL HIGH
Tube #: 1
Tube #: 4
WBC, CSF: 1 /cu mm (ref 0–5)
WBC, CSF: 0 /mm3 (ref 0–5)

## 2020-04-16 LAB — PROTIME-INR
INR: 1.2 (ref 0.8–1.2)
Prothrombin Time: 15.1 seconds (ref 11.4–15.2)

## 2020-04-16 LAB — CBC WITH DIFFERENTIAL/PLATELET
Abs Immature Granulocytes: 0.04 10*3/uL (ref 0.00–0.07)
Basophils Absolute: 0 10*3/uL (ref 0.0–0.1)
Basophils Relative: 1 %
Eosinophils Absolute: 0 10*3/uL (ref 0.0–0.5)
Eosinophils Relative: 1 %
HCT: 33.9 % — ABNORMAL LOW (ref 36.0–46.0)
Hemoglobin: 10.9 g/dL — ABNORMAL LOW (ref 12.0–15.0)
Immature Granulocytes: 1 %
Lymphocytes Relative: 38 %
Lymphs Abs: 2.5 10*3/uL (ref 0.7–4.0)
MCH: 28.1 pg (ref 26.0–34.0)
MCHC: 32.2 g/dL (ref 30.0–36.0)
MCV: 87.4 fL (ref 80.0–100.0)
Monocytes Absolute: 0.8 10*3/uL (ref 0.1–1.0)
Monocytes Relative: 13 %
Neutro Abs: 3.1 10*3/uL (ref 1.7–7.7)
Neutrophils Relative %: 46 %
Platelets: 245 10*3/uL (ref 150–400)
RBC: 3.88 MIL/uL (ref 3.87–5.11)
RDW: 12.3 % (ref 11.5–15.5)
WBC: 6.5 10*3/uL (ref 4.0–10.5)
nRBC: 0 % (ref 0.0–0.2)

## 2020-04-16 LAB — COMPREHENSIVE METABOLIC PANEL
ALT: 16 U/L (ref 0–44)
AST: 7 U/L — ABNORMAL LOW (ref 15–41)
Albumin: 3.7 g/dL (ref 3.5–5.0)
Alkaline Phosphatase: 43 U/L (ref 38–126)
Anion gap: 10 (ref 5–15)
BUN: 8 mg/dL (ref 6–20)
CO2: 23 mmol/L (ref 22–32)
Calcium: 8.5 mg/dL — ABNORMAL LOW (ref 8.9–10.3)
Chloride: 105 mmol/L (ref 98–111)
Creatinine, Ser: 0.6 mg/dL (ref 0.44–1.00)
GFR, Estimated: >60 mL/min (ref >60)
Glucose, Bld: 75 mg/dL (ref 70–99)
Potassium: 3.4 mmol/L — ABNORMAL LOW (ref 3.5–5.1)
Sodium: 138 mmol/L (ref 135–145)
Total Bilirubin: 0.6 mg/dL (ref 0.3–1.2)
Total Protein: 6.4 g/dL — ABNORMAL LOW (ref 6.5–8.1)

## 2020-04-16 LAB — PROCALCITONIN: Procalcitonin: <0.1 ng/mL

## 2020-04-16 LAB — APTT: aPTT: 38 seconds — ABNORMAL HIGH (ref 24–36)

## 2020-04-16 LAB — GLUCOSE, CSF: Glucose, CSF: 56 mg/dL (ref 40–70)

## 2020-04-16 LAB — HIV ANTIBODY (ROUTINE TESTING W REFLEX): HIV Screen 4th Generation wRfx: NONREACTIVE

## 2020-04-16 LAB — PROTEIN, CSF: Total  Protein, CSF: 25 mg/dL (ref 15–45)

## 2020-04-16 LAB — LACTIC ACID, PLASMA: Lactic Acid, Venous: 0.7 mmol/L (ref 0.5–1.9)

## 2020-04-16 LAB — PATHOLOGIST SMEAR REVIEW

## 2020-04-16 MED ORDER — DIPHENHYDRAMINE HCL 50 MG/ML IJ SOLN
25.0000 mg | Freq: Once | INTRAMUSCULAR | Status: AC | PRN
  Administered 2020-04-16: 25 mg via INTRAVENOUS

## 2020-04-16 MED ORDER — ACETAMINOPHEN 325 MG PO TABS
650.0000 mg | ORAL_TABLET | Freq: Four times a day (QID) | ORAL | Status: DC | PRN
  Administered 2020-04-17: 650 mg via ORAL
  Filled 2020-04-16: qty 2

## 2020-04-16 MED ORDER — ACETAMINOPHEN 650 MG RE SUPP: 650.0000 mg | Freq: Four times a day (QID) | RECTAL | Status: DC | PRN

## 2020-04-16 MED ORDER — IBUPROFEN 400 MG PO TABS: 800.0000 mg | ORAL_TABLET | Freq: Four times a day (QID) | ORAL | Status: DC | PRN

## 2020-04-16 MED ORDER — ASCORBIC ACID 500 MG PO TABS
500.0000 mg | ORAL_TABLET | Freq: Every day | ORAL | Status: DC
  Administered 2020-04-16 – 2020-04-18 (×3): 500 mg via ORAL
  Filled 2020-04-16 (×3): qty 1

## 2020-04-16 MED ORDER — ENOXAPARIN SODIUM 40 MG/0.4ML ~~LOC~~ SOLN
40.0000 mg | SUBCUTANEOUS | Status: DC
  Administered 2020-04-16 – 2020-04-17 (×2): 40 mg via SUBCUTANEOUS
  Filled 2020-04-16 (×3): qty 0.4

## 2020-04-16 MED ORDER — DIPHENHYDRAMINE HCL 50 MG/ML IJ SOLN
25.0000 mg | Freq: Two times a day (BID) | INTRAMUSCULAR | Status: AC | PRN
  Administered 2020-04-16: 25 mg via INTRAVENOUS
  Filled 2020-04-16: qty 1

## 2020-04-16 MED ORDER — VANCOMYCIN HCL IN DEXTROSE 1-5 GM/200ML-% IV SOLN
1000.0000 mg | Freq: Once | INTRAVENOUS | Status: AC
  Administered 2020-04-16: 1000 mg via INTRAVENOUS
  Filled 2020-04-16: qty 200

## 2020-04-16 MED ORDER — VANCOMYCIN HCL 500 MG IV SOLR
500.0000 mg | Freq: Three times a day (TID) | INTRAVENOUS | Status: DC
  Filled 2020-04-16: qty 500

## 2020-04-16 MED ORDER — SODIUM CHLORIDE 0.9 % IV SOLN
2.0000 g | Freq: Two times a day (BID) | INTRAVENOUS | Status: DC
  Administered 2020-04-16 – 2020-04-18 (×5): 2 g via INTRAVENOUS
  Filled 2020-04-16: qty 20
  Filled 2020-04-16 (×3): qty 2
  Filled 2020-04-16: qty 20

## 2020-04-16 MED ORDER — DIPHENHYDRAMINE HCL 50 MG/ML IJ SOLN
INTRAMUSCULAR | Status: AC
  Administered 2020-04-16: 25 mg
  Filled 2020-04-16: qty 1

## 2020-04-16 MED ORDER — FAMOTIDINE IN NACL 20-0.9 MG/50ML-% IV SOLN
INTRAVENOUS | Status: AC
  Administered 2020-04-16: 20 mg via INTRAVENOUS
  Filled 2020-04-16: qty 50

## 2020-04-16 MED ORDER — FAMOTIDINE IN NACL 20-0.9 MG/50ML-% IV SOLN: 20.0000 mg | Freq: Once | INTRAVENOUS | Status: AC

## 2020-04-16 MED ORDER — VANCOMYCIN HCL IN DEXTROSE 1-5 GM/200ML-% IV SOLN: 1000.0000 mg | Freq: Two times a day (BID) | INTRAVENOUS | Status: DC

## 2020-04-16 MED ORDER — LACTATED RINGERS IV SOLN: INTRAVENOUS | Status: AC

## 2020-04-16 MED ORDER — POLYETHYLENE GLYCOL 3350 17 G PO PACK: 17.0000 g | PACK | Freq: Every day | ORAL | Status: DC | PRN

## 2020-04-16 MED ORDER — KETOROLAC TROMETHAMINE 15 MG/ML IJ SOLN: 15.0000 mg | Freq: Four times a day (QID) | INTRAMUSCULAR | Status: DC | PRN

## 2020-04-16 MED ORDER — DEXAMETHASONE SODIUM PHOSPHATE 10 MG/ML IJ SOLN
10.0000 mg | Freq: Four times a day (QID) | INTRAMUSCULAR | Status: DC
  Administered 2020-04-16 – 2020-04-17 (×4): 10 mg via INTRAVENOUS
  Filled 2020-04-16 (×4): qty 1

## 2020-04-16 MED ORDER — POTASSIUM CHLORIDE CRYS ER 20 MEQ PO TBCR
40.0000 meq | EXTENDED_RELEASE_TABLET | Freq: Once | ORAL | Status: AC
  Administered 2020-04-16: 40 meq via ORAL
  Filled 2020-04-16: qty 2

## 2020-04-16 MED ORDER — VANCOMYCIN HCL 10 G IV SOLR: 1500.0000 mg | Freq: Once | INTRAVENOUS | Status: DC

## 2020-04-16 MED ORDER — ONDANSETRON HCL 4 MG PO TABS
4.0000 mg | ORAL_TABLET | Freq: Four times a day (QID) | ORAL | Status: DC | PRN
  Administered 2020-04-18: 4 mg via ORAL
  Filled 2020-04-16: qty 1

## 2020-04-16 MED ORDER — MAGNESIUM 200 MG PO TABS
200.0000 mg | ORAL_TABLET | Freq: Every day | ORAL | Status: DC
  Filled 2020-04-16: qty 1

## 2020-04-16 MED ORDER — VANCOMYCIN HCL IN DEXTROSE 1-5 GM/200ML-% IV SOLN
1000.0000 mg | Freq: Two times a day (BID) | INTRAVENOUS | Status: DC
  Administered 2020-04-16: 1000 mg via INTRAVENOUS
  Filled 2020-04-16: qty 200

## 2020-04-16 MED ORDER — KETOROLAC TROMETHAMINE 15 MG/ML IJ SOLN: 30.0000 mg | Freq: Four times a day (QID) | INTRAMUSCULAR | Status: DC | PRN

## 2020-04-16 MED ORDER — VANCOMYCIN HCL 500 MG/100ML IV SOLN
500.0000 mg | Freq: Three times a day (TID) | INTRAVENOUS | Status: DC
  Administered 2020-04-16 – 2020-04-18 (×5): 500 mg via INTRAVENOUS
  Filled 2020-04-16 (×6): qty 100

## 2020-04-16 MED ORDER — MAGNESIUM OXIDE 400 (241.3 MG) MG PO TABS
400.0000 mg | ORAL_TABLET | Freq: Every day | ORAL | Status: DC
  Administered 2020-04-16 – 2020-04-17 (×2): 400 mg via ORAL
  Filled 2020-04-16 (×2): qty 1

## 2020-04-16 MED ORDER — VANCOMYCIN HCL 1.5 G IV SOLR
1500.0000 mg | Freq: Once | INTRAVENOUS | Status: DC
  Filled 2020-04-16: qty 1500

## 2020-04-16 MED ORDER — ONDANSETRON HCL 4 MG/2ML IJ SOLN: 4.0000 mg | Freq: Four times a day (QID) | INTRAMUSCULAR | Status: DC | PRN

## 2020-04-16 MED ORDER — DIPHENHYDRAMINE HCL 50 MG/ML IJ SOLN
25.0000 mg | Freq: Three times a day (TID) | INTRAMUSCULAR | Status: DC | PRN
  Administered 2020-04-16 – 2020-04-18 (×5): 25 mg via INTRAVENOUS
  Filled 2020-04-16 (×5): qty 1

## 2020-04-16 MED ORDER — DIPHENHYDRAMINE HCL 50 MG/ML IJ SOLN: 25.0000 mg | Freq: Two times a day (BID) | INTRAMUSCULAR | Status: DC | PRN

## 2020-04-16 NOTE — Progress Notes (Signed)
PROGRESS NOTE   Priyal Mclay  GYK:[id number]    DOB: 05-27-2000    DOA: 04/15/2020  PCP: Lavada Mesi, MD   I have briefly reviewed patients previous medical records in [facility].  Chief Complaint  Patient presents with  . Fever  . Neck Pain    Brief Narrative:  20 year old female, works at a daycare, medical history of migraine headaches who presented to the [facility] ED with complaints of high fevers, headache, neck pain, nausea, vomiting and poor oral intake of approximately 1 to 2 days duration.  Underwent LP in ED.  Diagnosed with acute bacterial meningitis.  ID consulted.   Assessment & Plan:  Active Problems:   Acute bacterial meningitis   Vancomycin adverse reaction, initial encounter   Acute bacterial meningitis: History as noted above.  Reportedly had temperature of 105 F at home.  Interestingly CSF showed 0-1 WBCs, 56 glucose, total protein 25, but Gram stain showed abundant gram-positive cocci in clusters.  Blood cultures pending.  HIV screen nonreactive.  SARS coronavirus 2, influenza a and B PCR negative.  ID consultation appreciated.  Continuing IV vancomycin-had mild reaction/pruritus which resolved with Benadryl and Pepcid.  ID have added IV ceftriaxone and Decadron pending final culture results.  Droplet and contact isolation placed.  ID will report to health department for tracing at her daycare.  Addendum: At the time of writing this note, I received a secure chat message from the lab stating that there was a correction/update to the preliminary CSF Gram stain results which are now being called as no organisms.  Communicated with Dr. Ninetta Lights who recommends continuing antibiotics for now given that she had fevers.    Vancomycin adverse reaction Please see discussion above.  Appears to be mild.  Slowing the rate of infusion.  Hypokalemia: Replace and follow.  Check magnesium in a.m.  Acute anemia Hemoglobin is dropped from 12.9-10.9 which  may be correction of her hemoconcentration.  Baseline hemoglobin not known.  Follow CBC in a.m.  Body mass index is 29.29 kg/m.    DVT prophylaxis: enoxaparin (LOVENOX) injection 40 mg Start: 04/16/20 1000     Code Status: Full Code Family Communication: I discussed in detail with patient's mother on the phone, updated care and answered all questions.  Her daughter/the patient was on the speaker phone as well Disposition:  Status is: Observation  The patient will require care spanning > 2 midnights and should be moved to inpatient because: Inpatient level of care appropriate due to severity of illness  Dispo: The patient is from: Home              Anticipated d/c is to: Home              Anticipated d/c date is: 2 days              Patient currently is not medically stable to d/c.   Difficult to place patient No        Consultants:   Infectious disease  Procedures:   Lumbar puncture  Antimicrobials:    Anti-infectives (From admission, onward)   Start     Dose/Rate Route Frequency Ordered Stop   04/16/20 2000  vancomycin (VANCOREADY) IVPB 500 mg/100 mL        500 mg 50 mL/hr over 120 Minutes Intravenous Every 8 hours 04/16/20 1306     04/16/20 1700  vancomycin (VANCOCIN) 500 mg in sodium chloride 0.9 % 100 mL IVPB  Status:  Discontinued  500 mg 50 mL/hr over 120 Minutes Intravenous Every 8 hours 04/16/20 1259 04/16/20 1311   04/16/20 1200  vancomycin (VANCOCIN) IVPB 1000 mg/200 mL premix  Status:  Discontinued        1,000 mg 200 mL/hr over 60 Minutes Intravenous Every 12 hours 04/16/20 0155 04/16/20 0753   04/16/20 1200  vancomycin (VANCOCIN) IVPB 1000 mg/200 mL premix  Status:  Discontinued        1,000 mg 100 mL/hr over 120 Minutes Intravenous Every 12 hours 04/16/20 0753 04/16/20 1259   04/16/20 1200  cefTRIAXone (ROCEPHIN) 2 g in sodium chloride 0.9 % 100 mL IVPB        2 g 200 mL/hr over 30 Minutes Intravenous Every 12 hours 04/16/20 1114     04/16/20  0115  Vancomycin (VANCOCIN) 1,500 mg in sodium chloride 0.9 % 500 mL IVPB  Status:  Discontinued        1,500 mg 250 mL/hr over 120 Minutes Intravenous  Once 04/16/20 0104 04/16/20 0113   04/16/20 0115  vancomycin (VANCOCIN) 1,500 mg in sodium chloride 0.9 % 500 mL IVPB  Status:  Discontinued        1,500 mg 250 mL/hr over 120 Minutes Intravenous  Once 04/16/20 0108 04/16/20 0113   04/16/20 0115  vancomycin (VANCOCIN) IVPB 1000 mg/200 mL premix        1,000 mg 200 mL/hr over 60 Minutes Intravenous  Once 04/16/20 0113 04/16/20 0400   04/15/20 2330  cefTRIAXone (ROCEPHIN) 2 g in sodium chloride 0.9 % 100 mL IVPB        2 g 200 mL/hr over 30 Minutes Intravenous  Once 04/15/20 2326 04/16/20 0103        Subjective:  Patient seen this morning while she was still in the emergency department.  Overall feeling much better.  No fevers.  Headache, neck pain and photophobia significantly improved.  These have not completely resolved.  No further pruritus or skin rash.  Objective:   Vitals:   04/16/20 1032 04/16/20 1130 04/16/20 1200 04/16/20 1245  BP: 104/75 127/85 114/81 119/77  Pulse: 97 95 92 91  Resp: 16 18 16 16   Temp:    97.7 F (36.5 C)  TempSrc:    Oral  SpO2: 97% 99% 98% 98%  Weight:      Height:       I examined patient with a female nurse tech as chaperone in the room.  General exam: Young female, well-built and nourished, lying comfortably propped up in bed.  Oral mucosa with borderline hydration. Neck: Supple without stiffness. Respiratory system: Clear to auscultation. Respiratory effort normal. Cardiovascular system: S1 & S2 heard, RRR. No JVD, murmurs, rubs, gallops or clicks. No pedal edema. Gastrointestinal system: Abdomen is nondistended, soft and nontender. No organomegaly or masses felt. Normal bowel sounds heard. Central nervous system: Alert and oriented. No focal neurological deficits. Extremities: Symmetric 5 x 5 power. Skin: No rashes, lesions or  ulcers Psychiatry: Judgement and insight appear normal. Mood & affect appropriate.     Data Reviewed:   I have personally reviewed following labs and imaging studies   CBC: Recent Labs  Lab 04/15/20 1802 04/16/20 0622  WBC 6.8 6.5  NEUTROABS 4.4 3.1  HGB 12.9 10.9*  HCT 39.0 33.9*  MCV 85.9 87.4  PLT 308 245    Basic Metabolic Panel: Recent Labs  Lab 04/15/20 1802 04/16/20 0622  NA 138 138  K 4.1 3.4*  CL 102 105  CO2 24 23  GLUCOSE  96 75  BUN 8 8  CREATININE 0.71 0.60  CALCIUM 9.2 8.5*    Liver Function Tests: Recent Labs  Lab 04/15/20 1802 04/16/20 0622  AST 9* 7*  ALT 19 16  ALKPHOS 55 43  BILITOT 0.7 0.6  PROT 7.9 6.4*  ALBUMIN 4.5 3.7    CBG: No results for input(s): GLUCAP in the last 168 hours.  Microbiology Studies:   Recent Results (from the past 240 hour(s))  Resp Panel by RT-PCR (Flu A&B, Covid) Nasopharyngeal Swab     Status: None   Collection Time: 04/15/20  8:30 PM   Specimen: Nasopharyngeal Swab; Nasopharyngeal(NP) swabs in vial transport medium  Result Value Ref Range Status   SARS Coronavirus 2 by RT PCR NEGATIVE NEGATIVE Final    Comment: (NOTE) SARS-CoV-2 target nucleic acids are NOT DETECTED.  The SARS-CoV-2 RNA is generally detectable in upper respiratory specimens during the acute phase of infection. The lowest concentration of SARS-CoV-2 viral copies this assay can detect is 138 copies/mL. A negative result does not preclude SARS-Cov-2 infection and should not be used as the sole basis for treatment or other patient management decisions. A negative result may occur with  improper specimen collection/handling, submission of specimen other than nasopharyngeal swab, presence of viral mutation(s) within the areas targeted by this assay, and inadequate number of viral copies(<138 copies/mL). A negative result must be combined with clinical observations, patient history, and epidemiological information. The expected result is  Negative.  Fact Sheet for Patients:  BloggerCourse.com  Fact Sheet for Healthcare Providers:  SeriousBroker.it  This test is no t yet approved or cleared by the [place] FDA and  has been authorized for detection and/or diagnosis of SARS-CoV-2 by FDA under an Emergency Use Authorization (EUA). This EUA will remain  in effect (meaning this test can be used) for the duration of the COVID-19 declaration under Section 564(b)(1) of the Act, 21 U.S.C.section 360bbb-3(b)(1), unless the authorization is terminated  or revoked sooner.       Influenza A by PCR NEGATIVE NEGATIVE Final   Influenza B by PCR NEGATIVE NEGATIVE Final    Comment: (NOTE) The Xpert Xpress SARS-CoV-2/FLU/RSV plus assay is intended as an aid in the diagnosis of influenza from Nasopharyngeal swab specimens and should not be used as a sole basis for treatment. Nasal washings and aspirates are unacceptable for Xpert Xpress SARS-CoV-2/FLU/RSV testing.  Fact Sheet for Patients: BloggerCourse.com  Fact Sheet for Healthcare Providers: SeriousBroker.it  This test is not yet approved or cleared by the [place] FDA and has been authorized for detection and/or diagnosis of SARS-CoV-2 by FDA under an Emergency Use Authorization (EUA). This EUA will remain in effect (meaning this test can be used) for the duration of the COVID-19 declaration under Section 564(b)(1) of the Act, 21 U.S.C. section 360bbb-3(b)(1), unless the authorization is terminated or revoked.  Performed at [facility], [address], [place], [place] [address]   CSF culture with Stat gram stain     Status: None (Preliminary result)   Collection Time: 04/15/20 11:04 PM   Specimen: CSF; Cerebrospinal Fluid  Result Value Ref Range Status   Specimen Description CSF  Final   Special Requests NONE  Final   Gram Stain   Final     NO WBC SEEN ABUNDANT GRAM POSITIVE COCCI IN CLUSTERS Gram Stain Report Called to,Read Back By and Verified With: Beverly Gust, RN @ [phone] ON 04/16/20 Riesa Pope Performed at [facility], [address] [place], [place] [address]    Culture PENDING  Incomplete   Report Status PENDING  Incomplete     Radiology Studies:  DG Chest 2 View  Result Date: 04/15/2020 CLINICAL DATA:  Headache since yesterday, fever EXAM: CHEST - 2 VIEW COMPARISON:  None. FINDINGS: Frontal and lateral views of the chest demonstrate an unremarkable cardiac silhouette. No airspace disease, effusion, or pneumothorax. No acute bony abnormality. IMPRESSION: 1. No acute intrathoracic process. Electronically Signed   By: Sharlet Salina M.D.   On: 04/15/2020 17:58     Scheduled Meds:   . vitamin C  500 mg Oral Daily  . dexamethasone (DECADRON) injection  10 mg Intravenous Q6H  . enoxaparin (LOVENOX) injection  40 mg Subcutaneous Q24H  . Magnesium  200 mg Oral QHS    Continuous Infusions:   . cefTRIAXone (ROCEPHIN)  IV Stopped (04/16/20 1216)  . lactated ringers 125 mL/hr at 04/16/20 0625  . vancomycin       LOS: 0 days     Marcellus Scott, MD, [place], [facility]. [facility]    To contact the attending provider between 7A-7P or the covering provider during after hours 7P-7A, please log into the web site [email or web address] and access using universal [place] password for that web site. If you do not have the password, please call the hospital operator.  04/16/2020, 3:39 PM

## 2020-04-16 NOTE — ED Notes (Signed)
Rate decreased from 211mL/hr to 137mL/hr per pharmacy and Leafy Half, MD s/p medications given

## 2020-04-16 NOTE — ED Notes (Signed)
Patient stated that her scalp began to itch. Scalp noted to be red and inflamed. Antibiotic stopped and admitting MD and [place], MD made aware. Patient states itching has moved to her hands and arms. Verbal order for 25 of benedryl given.

## 2020-04-16 NOTE — H&P (Signed)
History and Physical    Kathryn Anderson ZOX:[id number] DOB: 03/10/2000 DOA: 04/15/2020  PCP: Lavada Mesi, MD  Patient coming from: Home   Chief Complaint:  Chief Complaint  Patient presents with  . Fever  . Neck Pain     HPI:    20 year old female with past medical history of migraine headaches who presents to [facility] emergency department with complaints of severe neck pain and fevers.  Patient explains that on Tuesday evening she began to experience neck stiffness and pain.  She describes pain as severe in intensity, tight in quality and associated with nausea.  This pain persisted into the evening when the patient exhibited a fever of over 105 F.  Needed 24 hours of followed, patient developed progressively worsening neck pain and headaches.  This was associated with intense nausea and occasional vomiting with poor oral intake.  Patient also explains that over the span of time she developed progressively worsening "brain fog."  Patient denies sick contacts (with the exception of her father who recently had shingles), recent travel or confirmed contact with COVID-19 infection.  Patient symptoms continue to worsen until she eventually presented to [facility] emergency department for evaluation.  Upon evaluation in the emergency department patient underwent lumbar puncture. While the initial cell count was not consistent with acute bacterial meningitis, Gram stain revealed abundant gram-positive cocci in clusters.  This finding was replicated by reviewing a mother CSF tube as well.  Patient was initiated on intravenous vancomycin based on these results and hospitalist group was then called to assess the patient renters in the hospital.  Of note, patient did exhibit a  bout of "red man" syndrome in the emergency department.  This quickly subsided with administration of Benadryl and famotidine.  Patient tolerated the remainder of intravenous vancomycin afterwards at a  reduced rate.  Review of Systems:   Review of Systems  Constitutional: Positive for fever and malaise/fatigue.  Musculoskeletal: Positive for neck pain.  Neurological: Positive for weakness.  All other systems reviewed and are negative.   History reviewed. No pertinent past medical history.  History reviewed. No pertinent surgical history.   reports that she has never smoked. She has never used smokeless tobacco. She reports that she does not drink alcohol and does not use drugs.  No Known Allergies  Family History  Problem Relation Age of Onset  . Healthy Mother   . Healthy Father   . Macular degeneration Maternal Grandmother   . Glaucoma Maternal Grandmother   . Dementia Paternal Grandfather   . Heart disease Neg Hx      Prior to Admission medications   Medication Sig Start Date End Date Taking? Authorizing Provider  cholecalciferol (VITAMIN D3) 25 MCG (1000 UNIT) tablet Take 1,000 Units by mouth daily.   Yes [provider]  ibuprofen (ADVIL) 200 MG tablet Take 200 mg by mouth every 6 (six) hours as needed for headache or mild pain.   Yes [provider]  magnesium 30 MG tablet Take 30 mg by mouth at bedtime.   Yes [provider]  vitamin C (ASCORBIC ACID) 500 MG tablet Take 500 mg by mouth daily.   Yes [provider]  amoxicillin (AMOXIL) 875 MG tablet  01/28/19   [provider]    Physical Exam: Vitals:   04/16/20 0103 04/16/20 0227 04/16/20 0230 04/16/20 0300  BP:  102/65 98/71 119/80  Pulse:  89 87 94  Resp:  20 16 16   Temp:  TempSrc:      SpO2:  97% 98% 98%  Weight: 68 kg     Height: 5' (1.524 m)       Constitutional: Acute alert and oriented x3, no associated distress.   Skin: no rashes, no lesions, good skin turgor noted. Eyes: Pupils are equally reactive to light.  No evidence of scleral icterus or conjunctival pallor.  ENMT: Moist mucous membranes noted.  Posterior pharynx clear of any exudate or  lesions.   Neck: Notable pain with both passive and active range of motion of the neck.  Brudzinski's sign positive.  Normal, supple, no masses, no thyromegaly.  No evidence of jugular venous distension.   Respiratory: clear to auscultation bilaterally, no wheezing, no crackles. Normal respiratory effort. No accessory muscle use.  Cardiovascular: Regular rate and rhythm, no murmurs / rubs / gallops. No extremity edema. 2+ pedal pulses. No carotid bruits.  Chest:   Nontender without crepitus or deformity.   Back:   Nontender without crepitus or deformity. Abdomen: Abdomen is soft and nontender.  No evidence of intra-abdominal masses.  Positive bowel sounds noted in all quadrants.   Musculoskeletal: No joint deformity upper and lower extremities. Good ROM, no contractures. Normal muscle tone.  Neurologic: CN 2-12 grossly intact. Sensation intact.  Patient moving all 4 extremities spontaneously.  Patient is following all commands.  Patient is responsive to verbal stimuli.   Psychiatric: Patient exhibits normal mood with appropriate affect.  Patient seems to possess insight as to their current situation.     Labs on Admission: I have personally reviewed following labs and imaging studies -   CBC: Recent Labs  Lab 04/15/20 1802  WBC 6.8  NEUTROABS 4.4  HGB 12.9  HCT 39.0  MCV 85.9  PLT 308   Basic Metabolic Panel: Recent Labs  Lab 04/15/20 1802  NA 138  K 4.1  CL 102  CO2 24  GLUCOSE 96  BUN 8  CREATININE 0.71  CALCIUM 9.2   GFR: Estimated Creatinine Clearance: 97.3 mL/min (by C-G formula based on SCr of 0.71 mg/dL). Liver Function Tests: Recent Labs  Lab 04/15/20 1802  AST 9*  ALT 19  ALKPHOS 55  BILITOT 0.7  PROT 7.9  ALBUMIN 4.5   No results for input(s): LIPASE, AMYLASE in the last 168 hours. No results for input(s): AMMONIA in the last 168 hours. Coagulation Profile: No results for input(s): INR, PROTIME in the last 168 hours. Cardiac Enzymes: No results for  input(s): CKTOTAL, CKMB, CKMBINDEX, TROPONINI in the last 168 hours. BNP (last 3 results) No results for input(s): PROBNP in the last 8760 hours. HbA1C: No results for input(s): HGBA1C in the last 72 hours. CBG: No results for input(s): GLUCAP in the last 168 hours. Lipid Profile: No results for input(s): CHOL, HDL, LDLCALC, TRIG, CHOLHDL, LDLDIRECT in the last 72 hours. Thyroid Function Tests: No results for input(s): TSH, T4TOTAL, FREET4, T3FREE, THYROIDAB in the last 72 hours. Anemia Panel: No results for input(s): VITAMINB12, FOLATE, FERRITIN, TIBC, IRON, RETICCTPCT in the last 72 hours. Urine analysis:    Component Value Date/Time   COLORURINE YELLOW 04/15/2020 2037   APPEARANCEUR CLEAR 04/15/2020 2037   LABSPEC 1.019 04/15/2020 2037   PHURINE 5.0 04/15/2020 2037   GLUCOSEU NEGATIVE 04/15/2020 2037   HGBUR NEGATIVE 04/15/2020 2037   BILIRUBINUR NEGATIVE 04/15/2020 2037   KETONESUR 5 (A) 04/15/2020 2037   PROTEINUR NEGATIVE 04/15/2020 2037   NITRITE NEGATIVE 04/15/2020 2037   LEUKOCYTESUR NEGATIVE 04/15/2020 2037    Radiological  Exams on Admission - Personally Reviewed: DG Chest 2 View  Result Date: 04/15/2020 CLINICAL DATA:  Headache since yesterday, fever EXAM: CHEST - 2 VIEW COMPARISON:  None. FINDINGS: Frontal and lateral views of the chest demonstrate an unremarkable cardiac silhouette. No airspace disease, effusion, or pneumothorax. No acute bony abnormality. IMPRESSION: 1. No acute intrathoracic process. Electronically Signed   By: Sharlet Salina M.D.   On: 04/15/2020 17:58    Assessment/Plan Active Problems:   Acute bacterial meningitis   Patient presenting with 48 hours of severe neck pain, stiffness and fevers as high as 105 F.  Patient initially presenting to ED with fever of 102.75F  Lumbar puncture reveals remarkably normal cell count however Gram stain's from 2 separate CSF tubes both reveal significant amounts of gram-positive cocci in clusters  Despite  the normal cell count, we must treat the patient for suspected acute bacterial meningitis for now as we await final culture results  Patient has been placed on intravenous vancomycin  Blood cultures have additionally been obtained  Will likely obtain infectious disease consultation in the morning to assist with interpretation of these results and assist with antibiotic management if indicated    Vancomycin adverse reaction, initial encounter  Patient did experience an episode of flushing and itching in the emergency department after initial administration of vancomycin.  This was addressed with administration of Benadryl and Pepcid and reduction in the rate of vancomycin infusion.    Code Status:  Full code Family Communication: deferred   Status is: Observation  The patient remains OBS appropriate and will d/c before 2 midnights.  Dispo: The patient is from: Home              Anticipated d/c is to: Home              Anticipated d/c date is: 2 days              Patient currently is not medically stable to d/c.   Difficult to place patient No        Marinda Elk MD [facility] Pager [phone]  If 7PM-7AM, please contact night-coverage [email or web address] Use universal [place] password for that web site. If you do not have the password, please call the hospital operator.  04/16/2020, 4:37 AM

## 2020-04-16 NOTE — Progress Notes (Signed)
Pharmacy Antibiotic Note  Kathryn Anderson is a 20 y.o. female admitted on 04/15/2020 with fever, cough and neck pain. Pharmacy has been consulted to dose vancomycin for gram + cocci in CSF fluid  Plan: Vancomycin 1gm IV x 1 then 1gm q12h (AUC 508.2, Scr 0.71) Follow renal function, cultures and clinical course  Height: 5' (152.4 cm) Weight: 68 kg (150 lb) IBW/kg (Calculated) : 45.5  Temp (24hrs), Avg:100.4 F (38 C), Min:98.6 F (37 C), Max:102.2 F (39 C)  Recent Labs  Lab 04/15/20 1802  WBC 6.8  CREATININE 0.71  LATICACIDVEN 0.9    Estimated Creatinine Clearance: 97.3 mL/min (by C-G formula based on SCr of 0.71 mg/dL).    No Known Allergies  Antimicrobials this admission: 2/10 vanc >> Dose adjustments this admission:   Microbiology results: 2/9 CSF >>abundant Gram + cocci in clusters  Thank you for allowing pharmacy to be a part of this patient's care.  Arley Phenix RPh 04/16/2020, 1:26 AM

## 2020-04-16 NOTE — Consult Note (Addendum)
[facility]    Date of Admission:  04/15/2020   Total days of antibiotics: 0 ceftriaxone, vancomycin               Reason for Consult: Bacterial Meningitis    Referring Provider: Hongalgi   Assessment: Acute bacterial meningitis Histamine reaction to vancomycin  Plan: 1. Await her Cx result.  2. Check HSV PCR on CSF 3. Ceftriaxone 4. Vancomycin 5. Steroids 6. Isolate for 24h of anbx 7. Report to health dept for tracing at her daycare.  8. Droplet isolation 9. Slower infusion of vanco, antihistamines  Comment- Her labs are unusual in that her Glc and Protein are relatively normal.  Would have liked to start her steroids prior to anbx dosing.   Thank you so much for this interesting consult,  Active Problems:   Acute bacterial meningitis   Vancomycin adverse reaction, initial encounter   . vitamin C  500 mg Oral Daily  . enoxaparin (LOVENOX) injection  40 mg Subcutaneous Q24H  . Magnesium  200 mg Oral QHS    HPI: Kathryn [place] is a 19 y.o. female with hx of migraines, comes to the ED on 2-10 with 48h of neck pain, headaches (different from her migaines), and fevers. Her temp at home was reported to be up to 105.  No photophobia.  Works in Audiological scientist at [employer], [place].  She came to [facility] ED and on LP was found to have: WBC To few to count Protein 25 Glc 56 G/S GPC clusters   Review of Systems: Review of Systems  Constitutional: Positive for chills and fever.  HENT: Positive for congestion.   Eyes: Negative for photophobia.  Respiratory: Positive for cough. Negative for shortness of breath.   Gastrointestinal: Positive for nausea and vomiting. Negative for constipation and diarrhea.  Genitourinary: Negative for dysuria.  rash from anbx but none prior.  No recent surgeries or ear problems.  Dad getting over shingles.  Please see HPI. All other systems reviewed and negative.   History reviewed. No pertinent  past medical history.  Social History   Tobacco Use  . Smoking status: Never Smoker  . Smokeless tobacco: Never Used  Substance Use Topics  . Alcohol use: Never  . Drug use: Never    Family History  Problem Relation Age of Onset  . Healthy Mother   . Healthy Father   . Macular degeneration Maternal Grandmother   . Glaucoma Maternal Grandmother   . Dementia Paternal Grandfather   . Heart disease Neg Hx      Medications:  Scheduled: . vitamin C  500 mg Oral Daily  . enoxaparin (LOVENOX) injection  40 mg Subcutaneous Q24H  . Magnesium  200 mg Oral QHS    Abtx:  Anti-infectives (From admission, onward)   Start     Dose/Rate Route Frequency Ordered Stop   04/16/20 1200  vancomycin (VANCOCIN) IVPB 1000 mg/200 mL premix  Status:  Discontinued        1,000 mg 200 mL/hr over 60 Minutes Intravenous Every 12 hours 04/16/20 0155 04/16/20 0753   04/16/20 1200  vancomycin (VANCOCIN) IVPB 1000 mg/200 mL premix        1,000 mg 100 mL/hr over 120 Minutes Intravenous Every 12 hours 04/16/20 0753     04/16/20 0115  Vancomycin (VANCOCIN) 1,500 mg in sodium chloride 0.9 % 500 mL IVPB  Status:  Discontinued        1,500 mg 250  mL/hr over 120 Minutes Intravenous  Once 04/16/20 0104 04/16/20 0113   04/16/20 0115  vancomycin (VANCOCIN) 1,500 mg in sodium chloride 0.9 % 500 mL IVPB  Status:  Discontinued        1,500 mg 250 mL/hr over 120 Minutes Intravenous  Once 04/16/20 0108 04/16/20 0113   04/16/20 0115  vancomycin (VANCOCIN) IVPB 1000 mg/200 mL premix        1,000 mg 200 mL/hr over 60 Minutes Intravenous  Once 04/16/20 0113 04/16/20 0400   04/15/20 2330  cefTRIAXone (ROCEPHIN) 2 g in sodium chloride 0.9 % 100 mL IVPB        2 g 200 mL/hr over 30 Minutes Intravenous  Once 04/15/20 2326 04/16/20 0103        OBJECTIVE: Blood pressure 104/75, pulse 97, temperature 98.6 F (37 C), resp. rate 16, height 5' (1.524 m), weight 68 kg, SpO2 97 %.  Physical Exam Vitals reviewed.   Constitutional:      General: She is not in acute distress.    Appearance: Normal appearance. She is not ill-appearing or toxic-appearing.  HENT:     Mouth/Throat:     Mouth: Mucous membranes are moist.     Pharynx: No oropharyngeal exudate.  Eyes:     Extraocular Movements: Extraocular movements intact.     Pupils: Pupils are equal, round, and reactive to light.     Comments: No photophobia  Cardiovascular:     Rate and Rhythm: Normal rate and regular rhythm.  Pulmonary:     Effort: Pulmonary effort is normal.     Breath sounds: Normal breath sounds.  Abdominal:     General: Bowel sounds are normal. There is no distension.     Palpations: Abdomen is soft.     Tenderness: There is no abdominal tenderness.  Musculoskeletal:     Cervical back: Normal range of motion and neck supple. No rigidity or tenderness.     Right lower leg: No edema.     Left lower leg: No edema.  Lymphadenopathy:     Cervical: No cervical adenopathy.  Skin:    General: Skin is warm and dry.     Findings: No rash.  Neurological:     General: No focal deficit present.     Mental Status: She is alert.  Psychiatric:        Mood and Affect: Mood normal.        Thought Content: Thought content normal.     Lab Results Results for orders placed or performed during the hospital encounter of 04/15/20 (from the past 48 hour(s))  Lactic acid, plasma     Status: None   Collection Time: 04/15/20  6:02 PM  Result Value Ref Range   Lactic Acid, Venous 0.9 0.5 - 1.9 mmol/L    Comment: Performed at [facility], [address] [place], [place] [address]  Comprehensive metabolic panel     Status: Abnormal   Collection Time: 04/15/20  6:02 PM  Result Value Ref Range   Sodium 138 135 - 145 mmol/L   Potassium 4.1 3.5 - 5.1 mmol/L   Chloride 102 98 - 111 mmol/L   CO2 24 22 - 32 mmol/L   Glucose, Bld 96 70 - 99 mg/dL    Comment: Glucose reference range applies only to samples taken after  fasting for at least 8 hours.   BUN 8 6 - 20 mg/dL   Creatinine, Ser 7.59 0.44 - 1.00 mg/dL   Calcium 9.2 8.9 -  10.3 mg/dL   Total Protein 7.9 6.5 - 8.1 g/dL   Albumin 4.5 3.5 - 5.0 g/dL   AST 9 (L) 15 - 41 U/L   ALT 19 0 - 44 U/L   Alkaline Phosphatase 55 38 - 126 U/L   Total Bilirubin 0.7 0.3 - 1.2 mg/dL   GFR, Estimated >16>60 >10>60 mL/min    Comment: (NOTE) Calculated using the CKD-EPI Creatinine Equation (2021)    Anion gap 12 5 - 15    Comment: Performed at [facility], [address] [place], [place] [address]  CBC with Differential     Status: None   Collection Time: 04/15/20  6:02 PM  Result Value Ref Range   WBC 6.8 4.0 - 10.5 K/uL   RBC 4.54 3.87 - 5.11 MIL/uL   Hemoglobin 12.9 12.0 - 15.0 g/dL   HCT 40.939.0 81.136.0 - 91.446.0 %   MCV 85.9 80.0 - 100.0 fL   MCH 28.4 26.0 - 34.0 pg   MCHC 33.1 30.0 - 36.0 g/dL   RDW 78.212.2 95.611.5 - 21.315.5 %   Platelets 308 150 - 400 K/uL   nRBC 0.0 0.0 - 0.2 %   Neutrophils Relative % 65 %   Neutro Abs 4.4 1.7 - 7.7 K/uL   Lymphocytes Relative 24 %   Lymphs Abs 1.6 0.7 - 4.0 K/uL   Monocytes Relative 11 %   Monocytes Absolute 0.7 0.1 - 1.0 K/uL   Eosinophils Relative 0 %   Eosinophils Absolute 0.0 0.0 - 0.5 K/uL   Basophils Relative 0 %   Basophils Absolute 0.0 0.0 - 0.1 K/uL   Immature Granulocytes 0 %   Abs Immature Granulocytes 0.03 0.00 - 0.07 K/uL    Comment: Performed at [facility], [address] [place], [place] [address]  I-Stat beta hCG blood, ED     Status: None   Collection Time: 04/15/20  6:09 PM  Result Value Ref Range   I-stat hCG, quantitative <5.0 <5 mIU/mL   Comment 3            Comment:   GEST. AGE      CONC.  (mIU/mL)   <=1 WEEK        5 - 50     2 WEEKS       50 - 500     3 WEEKS       100 - 10,000     4 WEEKS     1,000 - 30,000        FEMALE AND NON-PREGNANT FEMALE:     LESS THAN 5 mIU/mL   Resp Panel by RT-PCR (Flu A&B, Covid) Nasopharyngeal Swab     Status: None    Collection Time: 04/15/20  8:30 PM   Specimen: Nasopharyngeal Swab; Nasopharyngeal(NP) swabs in vial transport medium  Result Value Ref Range   SARS Coronavirus 2 by RT PCR NEGATIVE NEGATIVE    Comment: (NOTE) SARS-CoV-2 target nucleic acids are NOT DETECTED.  The SARS-CoV-2 RNA is generally detectable in upper respiratory specimens during the acute phase of infection. The lowest concentration of SARS-CoV-2 viral copies this assay can detect is 138 copies/mL. A negative result does not preclude SARS-Cov-2 infection and should not be used as the sole basis for treatment or other patient management decisions. A negative result may occur with  improper specimen collection/handling, submission of specimen other than nasopharyngeal swab, presence of viral mutation(s) within the areas targeted by this assay, and inadequate number of viral  copies(<138 copies/mL). A negative result must be combined with clinical observations, patient history, and epidemiological information. The expected result is Negative.  Fact Sheet for Patients:  BloggerCourse.com  Fact Sheet for Healthcare Providers:  SeriousBroker.it  This test is no t yet approved or cleared by the [place] FDA and  has been authorized for detection and/or diagnosis of SARS-CoV-2 by FDA under an Emergency Use Authorization (EUA). This EUA will remain  in effect (meaning this test can be used) for the duration of the COVID-19 declaration under Section 564(b)(1) of the Act, 21 U.S.C.section 360bbb-3(b)(1), unless the authorization is terminated  or revoked sooner.       Influenza A by PCR NEGATIVE NEGATIVE   Influenza B by PCR NEGATIVE NEGATIVE    Comment: (NOTE) The Xpert Xpress SARS-CoV-2/FLU/RSV plus assay is intended as an aid in the diagnosis of influenza from Nasopharyngeal swab specimens and should not be used as a sole basis for treatment. Nasal washings  and aspirates are unacceptable for Xpert Xpress SARS-CoV-2/FLU/RSV testing.  Fact Sheet for Patients: BloggerCourse.com  Fact Sheet for Healthcare Providers: SeriousBroker.it  This test is not yet approved or cleared by the [place] FDA and has been authorized for detection and/or diagnosis of SARS-CoV-2 by FDA under an Emergency Use Authorization (EUA). This EUA will remain in effect (meaning this test can be used) for the duration of the COVID-19 declaration under Section 564(b)(1) of the Act, 21 U.S.C. section 360bbb-3(b)(1), unless the authorization is terminated or revoked.  Performed at [facility], [address], [place], [place] [address]   Urinalysis, Routine w reflex microscopic Urine, Clean Catch     Status: Abnormal   Collection Time: 04/15/20  8:37 PM  Result Value Ref Range   Color, Urine YELLOW YELLOW   APPearance CLEAR CLEAR   Specific Gravity, Urine 1.019 1.005 - 1.030   pH 5.0 5.0 - 8.0   Glucose, UA NEGATIVE NEGATIVE mg/dL   Hgb urine dipstick NEGATIVE NEGATIVE   Bilirubin Urine NEGATIVE NEGATIVE   Ketones, ur 5 (A) NEGATIVE mg/dL   Protein, ur NEGATIVE NEGATIVE mg/dL   Nitrite NEGATIVE NEGATIVE   Leukocytes,Ua NEGATIVE NEGATIVE    Comment: Performed at [facility], [address] [place], [place] [address]  CSF cell count with differential     Status: None   Collection Time: 04/15/20 11:04 PM  Result Value Ref Range   Tube # 1    Color, CSF COLORLESS COLORLESS   Appearance, CSF CLEAR CLEAR   Supernatant COLORLESS    RBC Count, CSF 0 0 /cu mm   WBC, CSF 1 0 - 5 /cu mm   Other Cells, CSF TOO FEW TO COUNT, SMEAR AVAILABLE FOR REVIEW     Comment: Performed at [facility], [address], [place], [place] [address]  CSF culture with Stat gram stain     Status: None (Preliminary result)   Collection Time: 04/15/20 11:04 PM   Specimen: CSF;  Cerebrospinal Fluid  Result Value Ref Range   Specimen Description CSF    Special Requests NONE    Gram Stain      NO WBC SEEN ABUNDANT GRAM POSITIVE COCCI IN CLUSTERS Gram Stain Report Called to,Read Back By and Verified With: Beverly Gust, RN @ [phone] ON 04/16/20 Riesa Pope Performed at [facility], [address] [place], [place] [address]    Culture PENDING    Report Status PENDING   Glucose, CSF  Status: None   Collection Time: 04/15/20 11:04 PM  Result Value Ref Range   Glucose, CSF 56 40 - 70 mg/dL    Comment: Performed at [facility], [address] [place], [place] [address]  Protein, CSF     Status: None   Collection Time: 04/15/20 11:04 PM  Result Value Ref Range   Total  Protein, CSF 25 15 - 45 mg/dL    Comment: Performed at [facility], [address] [place], [place] [address]  Lactic acid, plasma     Status: None   Collection Time: 04/16/20  2:05 AM  Result Value Ref Range   Lactic Acid, Venous 0.7 0.5 - 1.9 mmol/L    Comment: Performed at [facility], [address] [place], [place] [address]  Procalcitonin     Status: None   Collection Time: 04/16/20  2:05 AM  Result Value Ref Range   Procalcitonin <0.10 ng/mL    Comment:        Interpretation: PCT (Procalcitonin) <= 0.5 ng/mL: Systemic infection (sepsis) is not likely. Local bacterial infection is possible. (NOTE)       Sepsis PCT Algorithm           Lower Respiratory Tract                                      Infection PCT Algorithm    ----------------------------     ----------------------------         PCT < 0.25 ng/mL                PCT < 0.10 ng/mL          Strongly encourage             Strongly discourage   discontinuation of antibiotics    initiation of antibiotics    ----------------------------     -----------------------------       PCT 0.25 - 0.50 ng/mL            PCT 0.10 - 0.25 ng/mL               OR        >80% decrease in PCT            Discourage initiation of                                            antibiotics      Encourage discontinuation           of antibiotics    ----------------------------     -----------------------------         PCT >= 0.50 ng/mL              PCT 0.26 - 0.50 ng/mL               AND        <80% decrease in PCT             Encourage initiation of                                             antibiotics  Encourage continuation           of antibiotics    ----------------------------     -----------------------------        PCT >= 0.50 ng/mL                  PCT > 0.50 ng/mL               AND         increase in PCT                  Strongly encourage                                      initiation of antibiotics    Strongly encourage escalation           of antibiotics                                     -----------------------------                                           PCT <= 0.25 ng/mL                                                 OR                                        > 80% decrease in PCT                                      Discontinue / Do not initiate                                             antibiotics  Performed at [facility], [address] [place], [place] [address]   Comprehensive metabolic panel     Status: Abnormal   Collection Time: 04/16/20  6:22 AM  Result Value Ref Range   Sodium 138 135 - 145 mmol/L   Potassium 3.4 (L) 3.5 - 5.1 mmol/L    Comment: DELTA CHECK NOTED   Chloride 105 98 - 111 mmol/L   CO2 23 22 - 32 mmol/L   Glucose, Bld 75 70 - 99 mg/dL    Comment: Glucose reference range applies only to samples taken after fasting for at least 8 hours.   BUN 8 6 - 20 mg/dL   Creatinine, Ser 6.04 0.44 - 1.00 mg/dL   Calcium 8.5 (L) 8.9 - 10.3 mg/dL   Total Protein 6.4 (L) 6.5 - 8.1 g/dL   Albumin 3.7 3.5 - 5.0 g/dL   AST 7 (L) 15 - 41 U/L   ALT 16 0 - 44 U/L   Alkaline Phosphatase 43 38 -  126 U/L  Total Bilirubin 0.6 0.3 - 1.2 mg/dL   GFR, Estimated >16 >10 mL/min    Comment: (NOTE) Calculated using the CKD-EPI Creatinine Equation (2021)    Anion gap 10 5 - 15    Comment: Performed at [facility], [address], [place], [place] [address]  CBC WITH DIFFERENTIAL     Status: Abnormal   Collection Time: 04/16/20  6:22 AM  Result Value Ref Range   WBC 6.5 4.0 - 10.5 K/uL   RBC 3.88 3.87 - 5.11 MIL/uL   Hemoglobin 10.9 (L) 12.0 - 15.0 g/dL   HCT 40.9 (L) 81.1 - 91.4 %   MCV 87.4 80.0 - 100.0 fL   MCH 28.1 26.0 - 34.0 pg   MCHC 32.2 30.0 - 36.0 g/dL   RDW 78.2 95.6 - 21.3 %   Platelets 245 150 - 400 K/uL   nRBC 0.0 0.0 - 0.2 %   Neutrophils Relative % 46 %   Neutro Abs 3.1 1.7 - 7.7 K/uL   Lymphocytes Relative 38 %   Lymphs Abs 2.5 0.7 - 4.0 K/uL   Monocytes Relative 13 %   Monocytes Absolute 0.8 0.1 - 1.0 K/uL   Eosinophils Relative 1 %   Eosinophils Absolute 0.0 0.0 - 0.5 K/uL   Basophils Relative 1 %   Basophils Absolute 0.0 0.0 - 0.1 K/uL   Immature Granulocytes 1 %   Abs Immature Granulocytes 0.04 0.00 - 0.07 K/uL    Comment: Performed at [facility], [address], [place], [place] [address]  APTT     Status: Abnormal   Collection Time: 04/16/20  6:22 AM  Result Value Ref Range   aPTT 38 (H) 24 - 36 seconds    Comment:        IF BASELINE aPTT IS ELEVATED, SUGGEST PATIENT RISK ASSESSMENT BE USED TO DETERMINE APPROPRIATE ANTICOAGULANT THERAPY. Performed at [facility], [address], [place], [place] [address]   Protime-INR     Status: None   Collection Time: 04/16/20  6:22 AM  Result Value Ref Range   Prothrombin Time 15.1 11.4 - 15.2 seconds   INR 1.2 0.8 - 1.2    Comment: (NOTE) INR goal varies based on device and disease states. Performed at [facility], [address], [place], [place] [address]       Component Value Date/Time   SDES CSF 04/15/2020 2304    SPECREQUEST NONE 04/15/2020 2304   CULT PENDING 04/15/2020 2304   REPTSTATUS PENDING 04/15/2020 2304   DG Chest 2 View  Result Date: 04/15/2020 CLINICAL DATA:  Headache since yesterday, fever EXAM: CHEST - 2 VIEW COMPARISON:  None. FINDINGS: Frontal and lateral views of the chest demonstrate an unremarkable cardiac silhouette. No airspace disease, effusion, or pneumothorax. No acute bony abnormality. IMPRESSION: 1. No acute intrathoracic process. Electronically Signed   By: Sharlet Salina M.D.   On: 04/15/2020 17:58   Recent Results (from the past 240 hour(s))  Resp Panel by RT-PCR (Flu A&B, Covid) Nasopharyngeal Swab     Status: None   Collection Time: 04/15/20  8:30 PM   Specimen: Nasopharyngeal Swab; Nasopharyngeal(NP) swabs in vial transport medium  Result Value Ref Range Status   SARS Coronavirus 2 by RT PCR NEGATIVE NEGATIVE Final    Comment: (NOTE) SARS-CoV-2 target nucleic acids are NOT DETECTED.  The SARS-CoV-2 RNA is generally detectable in upper respiratory specimens during the acute phase of infection. The lowest concentration of SARS-CoV-2 viral copies  this assay can detect is 138 copies/mL. A negative result does not preclude SARS-Cov-2 infection and should not be used as the sole basis for treatment or other patient management decisions. A negative result may occur with  improper specimen collection/handling, submission of specimen other than nasopharyngeal swab, presence of viral mutation(s) within the areas targeted by this assay, and inadequate number of viral copies(<138 copies/mL). A negative result must be combined with clinical observations, patient history, and epidemiological information. The expected result is Negative.  Fact Sheet for Patients:  BloggerCourse.com  Fact Sheet for Healthcare Providers:  SeriousBroker.it  This test is no t yet approved or cleared by the [place] FDA and  has been  authorized for detection and/or diagnosis of SARS-CoV-2 by FDA under an Emergency Use Authorization (EUA). This EUA will remain  in effect (meaning this test can be used) for the duration of the COVID-19 declaration under Section 564(b)(1) of the Act, 21 U.S.C.section 360bbb-3(b)(1), unless the authorization is terminated  or revoked sooner.       Influenza A by PCR NEGATIVE NEGATIVE Final   Influenza B by PCR NEGATIVE NEGATIVE Final    Comment: (NOTE) The Xpert Xpress SARS-CoV-2/FLU/RSV plus assay is intended as an aid in the diagnosis of influenza from Nasopharyngeal swab specimens and should not be used as a sole basis for treatment. Nasal washings and aspirates are unacceptable for Xpert Xpress SARS-CoV-2/FLU/RSV testing.  Fact Sheet for Patients: BloggerCourse.com  Fact Sheet for Healthcare Providers: SeriousBroker.it  This test is not yet approved or cleared by the [place] FDA and has been authorized for detection and/or diagnosis of SARS-CoV-2 by FDA under an Emergency Use Authorization (EUA). This EUA will remain in effect (meaning this test can be used) for the duration of the COVID-19 declaration under Section 564(b)(1) of the Act, 21 U.S.C. section 360bbb-3(b)(1), unless the authorization is terminated or revoked.  Performed at [facility], [address], [place], [place] [address]   CSF culture with Stat gram stain     Status: None (Preliminary result)   Collection Time: 04/15/20 11:04 PM   Specimen: CSF; Cerebrospinal Fluid  Result Value Ref Range Status   Specimen Description CSF  Final   Special Requests NONE  Final   Gram Stain   Final    NO WBC SEEN ABUNDANT GRAM POSITIVE COCCI IN CLUSTERS Gram Stain Report Called to,Read Back By and Verified With: Beverly Gust, RN @ [phone] ON 04/16/20 Riesa Pope Performed at [facility], [address], [place], [place] [address]     Culture PENDING  Incomplete   Report Status PENDING  Incomplete    Microbiology: Recent Results (from the past 240 hour(s))  Resp Panel by RT-PCR (Flu A&B, Covid) Nasopharyngeal Swab     Status: None   Collection Time: 04/15/20  8:30 PM   Specimen: Nasopharyngeal Swab; Nasopharyngeal(NP) swabs in vial transport medium  Result Value Ref Range Status   SARS Coronavirus 2 by RT PCR NEGATIVE NEGATIVE Final    Comment: (NOTE) SARS-CoV-2 target nucleic acids are NOT DETECTED.  The SARS-CoV-2 RNA is generally detectable in upper respiratory specimens during the acute phase of infection. The lowest concentration of SARS-CoV-2 viral copies this assay can detect is 138 copies/mL. A negative result does not preclude SARS-Cov-2 infection and should not be used as the sole basis for treatment or other patient management decisions. A negative result may occur with  improper specimen collection/handling, submission of specimen other than nasopharyngeal swab, presence  of viral mutation(s) within the areas targeted by this assay, and inadequate number of viral copies(<138 copies/mL). A negative result must be combined with clinical observations, patient history, and epidemiological information. The expected result is Negative.  Fact Sheet for Patients:  BloggerCourse.com  Fact Sheet for Healthcare Providers:  SeriousBroker.it  This test is no t yet approved or cleared by the [place] FDA and  has been authorized for detection and/or diagnosis of SARS-CoV-2 by FDA under an Emergency Use Authorization (EUA). This EUA will remain  in effect (meaning this test can be used) for the duration of the COVID-19 declaration under Section 564(b)(1) of the Act, 21 U.S.C.section 360bbb-3(b)(1), unless the authorization is terminated  or revoked sooner.       Influenza A by PCR NEGATIVE NEGATIVE Final   Influenza B by PCR NEGATIVE NEGATIVE Final     Comment: (NOTE) The Xpert Xpress SARS-CoV-2/FLU/RSV plus assay is intended as an aid in the diagnosis of influenza from Nasopharyngeal swab specimens and should not be used as a sole basis for treatment. Nasal washings and aspirates are unacceptable for Xpert Xpress SARS-CoV-2/FLU/RSV testing.  Fact Sheet for Patients: BloggerCourse.com  Fact Sheet for Healthcare Providers: SeriousBroker.it  This test is not yet approved or cleared by the [place] FDA and has been authorized for detection and/or diagnosis of SARS-CoV-2 by FDA under an Emergency Use Authorization (EUA). This EUA will remain in effect (meaning this test can be used) for the duration of the COVID-19 declaration under Section 564(b)(1) of the Act, 21 U.S.C. section 360bbb-3(b)(1), unless the authorization is terminated or revoked.  Performed at [facility], [address] [place], [place] [address]   CSF culture with Stat gram stain     Status: None (Preliminary result)   Collection Time: 04/15/20 11:04 PM   Specimen: CSF; Cerebrospinal Fluid  Result Value Ref Range Status   Specimen Description CSF  Final   Special Requests NONE  Final   Gram Stain   Final    NO WBC SEEN ABUNDANT GRAM POSITIVE COCCI IN CLUSTERS Gram Stain Report Called to,Read Back By and Verified With: Beverly Gust, RN @ [phone] ON 04/16/20 Riesa Pope Performed at [facility], [address] [place], [place] [address]    Culture PENDING  Incomplete   Report Status PENDING  Incomplete    Radiographs and labs were personally reviewed by me.   Johny Sax, MD [facility] for Infectious Disease [facility] [phone] 04/16/2020, 10:41 AM

## 2020-04-16 NOTE — Progress Notes (Signed)
Received a call from lab stating that Pt's CSF did not show gram + cocci, per lab technician it was verified by pathologist also. Both MD's have been notified via secure chat.

## 2020-04-16 NOTE — ED Notes (Signed)
ED TO INPATIENT HANDOFF REPORT  Name/Age/Gender Kathryn Anderson 20 y.o. female  Code Status    Code Status Orders  (From admission, onward)         Start     Ordered   04/16/20 0537  Full code  Continuous        04/16/20 0536        Code Status History    This patient has a current code status but no historical code status.   Advance Care Planning Activity      Home/SNF/Other Home  Chief Complaint Acute bacterial meningitis [G00.9]  Level of Care/Admitting Diagnosis ED Disposition    ED Disposition Condition Comment   Admit  Hospital Area: [facility] [[id number]]  Level of Care: Med-Surg [16]  Covid Evaluation: Confirmed COVID Negative  Diagnosis: Acute bacterial meningitis [[id number]]  Admitting Physician: Marinda Elk [[id number]]  Attending Physician: Marinda Elk [[id number]]       Medical History History reviewed. No pertinent past medical history.  Allergies No Known Allergies  IV Location/Drains/Wounds Patient Lines/Drains/Airways Status    Active Line/Drains/Airways    Name Placement date Placement time Site Days   Peripheral IV 04/15/20 Left Antecubital 04/15/20  2011  Antecubital  1          Labs/Imaging Results for orders placed or performed during the hospital encounter of 04/15/20 (from the past 48 hour(s))  Lactic acid, plasma     Status: None   Collection Time: 04/15/20  6:02 PM  Result Value Ref Range   Lactic Acid, Venous 0.9 0.5 - 1.9 mmol/L    Comment: Performed at [facility], [address], [place], [place] [address]  Comprehensive metabolic panel     Status: Abnormal   Collection Time: 04/15/20  6:02 PM  Result Value Ref Range   Sodium 138 135 - 145 mmol/L   Potassium 4.1 3.5 - 5.1 mmol/L   Chloride 102 98 - 111 mmol/L   CO2 24 22 - 32 mmol/L   Glucose, Bld 96 70 - 99 mg/dL    Comment: Glucose reference range applies only to samples taken after fasting for at least 8 hours.    BUN 8 6 - 20 mg/dL   Creatinine, Ser 3.24 0.44 - 1.00 mg/dL   Calcium 9.2 8.9 - 40.1 mg/dL   Total Protein 7.9 6.5 - 8.1 g/dL   Albumin 4.5 3.5 - 5.0 g/dL   AST 9 (L) 15 - 41 U/L   ALT 19 0 - 44 U/L   Alkaline Phosphatase 55 38 - 126 U/L   Total Bilirubin 0.7 0.3 - 1.2 mg/dL   GFR, Estimated >02 >72 mL/min    Comment: (NOTE) Calculated using the CKD-EPI Creatinine Equation (2021)    Anion gap 12 5 - 15    Comment: Performed at [facility], [address] [place], [place] [address]  CBC with Differential     Status: None   Collection Time: 04/15/20  6:02 PM  Result Value Ref Range   WBC 6.8 4.0 - 10.5 K/uL   RBC 4.54 3.87 - 5.11 MIL/uL   Hemoglobin 12.9 12.0 - 15.0 g/dL   HCT 40.3 47.4 - 25.9 %   MCV 85.9 80.0 - 100.0 fL   MCH 28.4 26.0 - 34.0 pg   MCHC 33.1 30.0 - 36.0 g/dL   RDW 56.3 87.5 - 64.3 %   Platelets 308 150 - 400 K/uL   nRBC 0.0 0.0 - 0.2 %  Neutrophils Relative % 65 %   Neutro Abs 4.4 1.7 - 7.7 K/uL   Lymphocytes Relative 24 %   Lymphs Abs 1.6 0.7 - 4.0 K/uL   Monocytes Relative 11 %   Monocytes Absolute 0.7 0.1 - 1.0 K/uL   Eosinophils Relative 0 %   Eosinophils Absolute 0.0 0.0 - 0.5 K/uL   Basophils Relative 0 %   Basophils Absolute 0.0 0.0 - 0.1 K/uL   Immature Granulocytes 0 %   Abs Immature Granulocytes 0.03 0.00 - 0.07 K/uL    Comment: Performed at [facility], [address] [place], [place] [address]  I-Stat beta hCG blood, ED     Status: None   Collection Time: 04/15/20  6:09 PM  Result Value Ref Range   I-stat hCG, quantitative <5.0 <5 mIU/mL   Comment 3            Comment:   GEST. AGE      CONC.  (mIU/mL)   <=1 WEEK        5 - 50     2 WEEKS       50 - 500     3 WEEKS       100 - 10,000     4 WEEKS     1,000 - 30,000        FEMALE AND NON-PREGNANT FEMALE:     LESS THAN 5 mIU/mL   Resp Panel by RT-PCR (Flu A&B, Covid) Nasopharyngeal Swab     Status: None   Collection Time: 04/15/20  8:30 PM    Specimen: Nasopharyngeal Swab; Nasopharyngeal(NP) swabs in vial transport medium  Result Value Ref Range   SARS Coronavirus 2 by RT PCR NEGATIVE NEGATIVE    Comment: (NOTE) SARS-CoV-2 target nucleic acids are NOT DETECTED.  The SARS-CoV-2 RNA is generally detectable in upper respiratory specimens during the acute phase of infection. The lowest concentration of SARS-CoV-2 viral copies this assay can detect is 138 copies/mL. A negative result does not preclude SARS-Cov-2 infection and should not be used as the sole basis for treatment or other patient management decisions. A negative result may occur with  improper specimen collection/handling, submission of specimen other than nasopharyngeal swab, presence of viral mutation(s) within the areas targeted by this assay, and inadequate number of viral copies(<138 copies/mL). A negative result must be combined with clinical observations, patient history, and epidemiological information. The expected result is Negative.  Fact Sheet for Patients:  BloggerCourse.com  Fact Sheet for Healthcare Providers:  SeriousBroker.it  This test is no t yet approved or cleared by the [place] FDA and  has been authorized for detection and/or diagnosis of SARS-CoV-2 by FDA under an Emergency Use Authorization (EUA). This EUA will remain  in effect (meaning this test can be used) for the duration of the COVID-19 declaration under Section 564(b)(1) of the Act, 21 U.S.C.section 360bbb-3(b)(1), unless the authorization is terminated  or revoked sooner.       Influenza A by PCR NEGATIVE NEGATIVE   Influenza B by PCR NEGATIVE NEGATIVE    Comment: (NOTE) The Xpert Xpress SARS-CoV-2/FLU/RSV plus assay is intended as an aid in the diagnosis of influenza from Nasopharyngeal swab specimens and should not be used as a sole basis for treatment. Nasal washings and aspirates are unacceptable for Xpert Xpress  SARS-CoV-2/FLU/RSV testing.  Fact Sheet for Patients: BloggerCourse.com  Fact Sheet for Healthcare Providers: SeriousBroker.it  This test is not yet approved or cleared by the [place] and  has been authorized for detection and/or diagnosis of SARS-CoV-2 by FDA under an Emergency Use Authorization (EUA). This EUA will remain in effect (meaning this test can be used) for the duration of the COVID-19 declaration under Section 564(b)(1) of the Act, 21 U.S.C. section 360bbb-3(b)(1), unless the authorization is terminated or revoked.  Performed at [facility], [address] [place], [place] [address]   Urinalysis, Routine w reflex microscopic Urine, Clean Catch     Status: Abnormal   Collection Time: 04/15/20  8:37 PM  Result Value Ref Range   Color, Urine YELLOW YELLOW   APPearance CLEAR CLEAR   Specific Gravity, Urine 1.019 1.005 - 1.030   pH 5.0 5.0 - 8.0   Glucose, UA NEGATIVE NEGATIVE mg/dL   Hgb urine dipstick NEGATIVE NEGATIVE   Bilirubin Urine NEGATIVE NEGATIVE   Ketones, ur 5 (A) NEGATIVE mg/dL   Protein, ur NEGATIVE NEGATIVE mg/dL   Nitrite NEGATIVE NEGATIVE   Leukocytes,Ua NEGATIVE NEGATIVE    Comment: Performed at [facility], [address] [place], [place] [address]  CSF cell count with differential     Status: None   Collection Time: 04/15/20 11:04 PM  Result Value Ref Range   Tube # 1    Color, CSF COLORLESS COLORLESS   Appearance, CSF CLEAR CLEAR   Supernatant COLORLESS    RBC Count, CSF 0 0 /cu mm   WBC, CSF 1 0 - 5 /cu mm   Other Cells, CSF TOO FEW TO COUNT, SMEAR AVAILABLE FOR REVIEW     Comment: Performed at [facility], [address] [place], [place] [address]  CSF culture with Stat gram stain     Status: None (Preliminary result)   Collection Time: 04/15/20 11:04 PM   Specimen: CSF; Cerebrospinal Fluid  Result Value Ref Range    Specimen Description CSF    Special Requests NONE    Gram Stain      NO WBC SEEN ABUNDANT GRAM POSITIVE COCCI IN CLUSTERS Gram Stain Report Called to,Read Back By and Verified With: Beverly Gust, RN @ [phone] ON 04/16/20 Riesa Pope Performed at [facility], [address], [place], [place] [address]    Culture PENDING    Report Status PENDING   Glucose, CSF     Status: None   Collection Time: 04/15/20 11:04 PM  Result Value Ref Range   Glucose, CSF 56 40 - 70 mg/dL    Comment: Performed at [facility], [address] [place], [place] [address]  Protein, CSF     Status: None   Collection Time: 04/15/20 11:04 PM  Result Value Ref Range   Total  Protein, CSF 25 15 - 45 mg/dL    Comment: Performed at [facility], [address] [place], [place] [address]  CSF cell count with differential     Status: Abnormal   Collection Time: 04/15/20 11:04 PM  Result Value Ref Range   Tube # 4    Color, CSF COLORLESS COLORLESS   Appearance, CSF CLEAR CLEAR   Supernatant NOT INDICATED    RBC Count, CSF 1 (H) 0 /cu mm   WBC, CSF 0 0 - 5 /cu mm   Other Cells, CSF TOO FEW TO COUNT, SMEAR AVAILABLE FOR REVIEW     Comment: Performed at [facility], [address] [place], [place] [address]  Lactic acid, plasma     Status: None   Collection Time: 04/16/20  2:05 AM  Result Value Ref Range   Lactic Acid, Venous 0.7 0.5 - 1.9 mmol/L    Comment: Performed at [facility], [address] [place], [place] [address]  Procalcitonin     Status: None   Collection Time: 04/16/20  2:05 AM  Result Value Ref Range   Procalcitonin <0.10 ng/mL    Comment:        Interpretation: PCT (Procalcitonin) <= 0.5 ng/mL: Systemic infection (sepsis) is not likely. Local bacterial infection is possible. (NOTE)       Sepsis PCT Algorithm           Lower Respiratory Tract                                      Infection PCT  Algorithm    ----------------------------     ----------------------------         PCT < 0.25 ng/mL                PCT < 0.10 ng/mL          Strongly encourage             Strongly discourage   discontinuation of antibiotics    initiation of antibiotics    ----------------------------     -----------------------------       PCT 0.25 - 0.50 ng/mL            PCT 0.10 - 0.25 ng/mL               OR       >80% decrease in PCT            Discourage initiation of                                            antibiotics      Encourage discontinuation           of antibiotics    ----------------------------     -----------------------------         PCT >= 0.50 ng/mL              PCT 0.26 - 0.50 ng/mL               AND        <80% decrease in PCT             Encourage initiation of                                             antibiotics       Encourage continuation           of antibiotics    ----------------------------     -----------------------------        PCT >= 0.50 ng/mL                  PCT > 0.50 ng/mL               AND         increase in PCT                  Strongly encourage  initiation of antibiotics    Strongly encourage escalation           of antibiotics                                     -----------------------------                                           PCT <= 0.25 ng/mL                                                 OR                                        > 80% decrease in PCT                                      Discontinue / Do not initiate                                             antibiotics  Performed at [facility], [address] [place], [place] [address]   HIV Antibody (routine testing w rflx)     Status: None   Collection Time: 04/16/20  6:22 AM  Result Value Ref Range   HIV Screen 4th Generation wRfx Non Reactive Non Reactive    Comment: Performed at [facility], [address] [place], [place] [address]  Comprehensive metabolic panel     Status: Abnormal   Collection Time: 04/16/20  6:22 AM  Result Value Ref Range   Sodium 138 135 - 145 mmol/L   Potassium 3.4 (L) 3.5 - 5.1 mmol/L    Comment: DELTA CHECK NOTED   Chloride 105 98 - 111 mmol/L   CO2 23 22 - 32 mmol/L   Glucose, Bld 75 70 - 99 mg/dL    Comment: Glucose reference range applies only to samples taken after fasting for at least 8 hours.   BUN 8 6 - 20 mg/dL   Creatinine, Ser 0.980.60 0.44 - 1.00 mg/dL   Calcium 8.5 (L) 8.9 - 10.3 mg/dL   Total Protein 6.4 (L) 6.5 - 8.1 g/dL   Albumin 3.7 3.5 - 5.0 g/dL   AST 7 (L) 15 - 41 U/L   ALT 16 0 - 44 U/L   Alkaline Phosphatase 43 38 - 126 U/L   Total Bilirubin 0.6 0.3 - 1.2 mg/dL   GFR, Estimated >11>60 >91>60 mL/min    Comment: (NOTE) Calculated using the CKD-EPI Creatinine Equation (2021)    Anion gap 10 5 - 15    Comment: Performed at [facility], [address] [place], [place] [address]  CBC WITH DIFFERENTIAL     Status: Abnormal   Collection Time: 04/16/20  6:22 AM  Result Value Ref Range   WBC 6.5 4.0 - 10.5 K/uL  RBC 3.88 3.87 - 5.11 MIL/uL   Hemoglobin 10.9 (L) 12.0 - 15.0 g/dL   HCT 78.2 (L) 95.6 - 21.3 %   MCV 87.4 80.0 - 100.0 fL   MCH 28.1 26.0 - 34.0 pg   MCHC 32.2 30.0 - 36.0 g/dL   RDW 08.6 57.8 - 46.9 %   Platelets 245 150 - 400 K/uL   nRBC 0.0 0.0 - 0.2 %   Neutrophils Relative % 46 %   Neutro Abs 3.1 1.7 - 7.7 K/uL   Lymphocytes Relative 38 %   Lymphs Abs 2.5 0.7 - 4.0 K/uL   Monocytes Relative 13 %   Monocytes Absolute 0.8 0.1 - 1.0 K/uL   Eosinophils Relative 1 %   Eosinophils Absolute 0.0 0.0 - 0.5 K/uL   Basophils Relative 1 %   Basophils Absolute 0.0 0.0 - 0.1 K/uL   Immature Granulocytes 1 %   Abs Immature Granulocytes 0.04 0.00 - 0.07 K/uL    Comment: Performed at [facility], [address], [place], [place] [address]  APTT     Status: Abnormal   Collection Time: 04/16/20  6:22 AM   Result Value Ref Range   aPTT 38 (H) 24 - 36 seconds    Comment:        IF BASELINE aPTT IS ELEVATED, SUGGEST PATIENT RISK ASSESSMENT BE USED TO DETERMINE APPROPRIATE ANTICOAGULANT THERAPY. Performed at [facility], [address] [place], [place] [address]   Protime-INR     Status: None   Collection Time: 04/16/20  6:22 AM  Result Value Ref Range   Prothrombin Time 15.1 11.4 - 15.2 seconds   INR 1.2 0.8 - 1.2    Comment: (NOTE) INR goal varies based on device and disease states. Performed at [facility], [address] [place], [place] [address]    DG Chest 2 View  Result Date: 04/15/2020 CLINICAL DATA:  Headache since yesterday, fever EXAM: CHEST - 2 VIEW COMPARISON:  None. FINDINGS: Frontal and lateral views of the chest demonstrate an unremarkable cardiac silhouette. No airspace disease, effusion, or pneumothorax. No acute bony abnormality. IMPRESSION: 1. No acute intrathoracic process. Electronically Signed   By: Sharlet Salina M.D.   On: 04/15/2020 17:58    Pending Labs Unresulted Labs (From admission, onward)          Start     Ordered   04/16/20 0136  Gram stain  ONCE - STAT,   STAT       Comments: Run on second tube.    04/16/20 0135   04/16/20 0132  Blood culture (routine x 2)  BLOOD CULTURE X 2,   STAT      04/16/20 0132   04/15/20 2304  Pathologist smear review  Once,   R        04/15/20 2304   04/15/20 2258  VDRL, CSF  ONCE - STAT,   STAT        04/15/20 2258          Vitals/Pain Today's Vitals   04/16/20 0700 04/16/20 1032 04/16/20 1130 04/16/20 1200  BP: 118/78 104/75 127/85 114/81  Pulse: 82 97 95 92  Resp: 17 16 18 16   Temp:      TempSrc:      SpO2: 94% 97% 99% 98%  Weight:      Height:      PainSc:        Isolation Precautions No active isolations  Medications Medications  Magnesium  TABS 200 mg (has no administration in time range)  ascorbic acid (VITAMIN C) tablet 500 mg (500 mg Oral Given  04/16/20 1051)  enoxaparin (LOVENOX) injection 40 mg (40 mg Subcutaneous Given 04/16/20 1051)  acetaminophen (TYLENOL) tablet 650 mg (has no administration in time range)    Or  acetaminophen (TYLENOL) suppository 650 mg (has no administration in time range)  polyethylene glycol (MIRALAX / GLYCOLAX) packet 17 g (has no administration in time range)  ondansetron (ZOFRAN) tablet 4 mg (has no administration in time range)    Or  ondansetron (ZOFRAN) injection 4 mg (has no administration in time range)  ibuprofen (ADVIL) tablet 800 mg (has no administration in time range)  ketorolac (TORADOL) 15 MG/ML injection 15 mg (has no administration in time range)    Or  ketorolac (TORADOL) 15 MG/ML injection 30 mg (has no administration in time range)  lactated ringers infusion ( Intravenous New Bag/Given 04/16/20 0625)  vancomycin (VANCOCIN) IVPB 1000 mg/200 mL premix (1,000 mg Intravenous New Bag/Given (Non-Interop) 04/16/20 1218)  cefTRIAXone (ROCEPHIN) 2 g in sodium chloride 0.9 % 100 mL IVPB (0 g Intravenous Stopped 04/16/20 1216)  dexamethasone (DECADRON) injection 10 mg (10 mg Intravenous Given 04/16/20 1142)  acetaminophen (TYLENOL) tablet 650 mg (650 mg Oral Given 04/15/20 1819)  sodium chloride 0.9 % bolus 1,000 mL (0 mLs Intravenous Stopped 04/15/20 2243)  ibuprofen (ADVIL) tablet 600 mg (600 mg Oral Given 04/15/20 2017)  cefTRIAXone (ROCEPHIN) 2 g in sodium chloride 0.9 % 100 mL IVPB (0 g Intravenous Stopped 04/16/20 0103)  vancomycin (VANCOCIN) IVPB 1000 mg/200 mL premix (0 mg Intravenous Stopped 04/16/20 0400)  diphenhydrAMINE (BENADRYL) 50 MG/ML injection (25 mg  Given 04/16/20 0218)  diphenhydrAMINE (BENADRYL) injection 25 mg (25 mg Intravenous Given 04/16/20 0253)  famotidine (PEPCID) IVPB 20 mg premix (0 mg Intravenous Stopped 04/16/20 0300)  diphenhydrAMINE (BENADRYL) injection 25 mg (25 mg Intravenous Given 04/16/20 1143)    Mobility walks

## 2020-04-16 NOTE — Progress Notes (Signed)
Pharmacy Antibiotic Note  Kathryn Anderson is a 20 y.o. female admitted on 04/15/2020 with fever, cough and neck pain. Pharmacy has been consulted to dose vancomycin for gram + cocci in CSF fluid  Plan: Vancomycin 1gm IV x 2 given then will increase dosing to 500 mg iv q 8 hours to target trough 15-20 mcg/ml Follow renal function, cultures, and clinical course Levels as indicated  Height: 5' (152.4 cm) Weight: 68 kg (150 lb) IBW/kg (Calculated) : 45.5  Temp (24hrs), Avg:99.5 F (37.5 C), Min:97.7 F (36.5 C), Max:102.2 F (39 C)  Recent Labs  Lab 04/15/20 1802 04/16/20 0205 04/16/20 0622  WBC 6.8  --  6.5  CREATININE 0.71  --  0.60  LATICACIDVEN 0.9 0.7  --     Estimated Creatinine Clearance: 97.3 mL/min (by C-G formula based on SCr of 0.6 mg/dL).    No Known Allergies  Antimicrobials this admission: 2/10 vanc >>  Dose adjustments this admission: 1000 mg iv q 12 hours >> 500 mg iv q 8 hours  Microbiology results: 2/9 CSF >>abundant Gram + cocci in clusters 2/10 BCx:   Thank you for allowing pharmacy to be a part of this patient's care.  Luisa Hart D 04/16/2020, 12:59 PM

## 2020-04-16 NOTE — ED Provider Notes (Signed)
Nursing notes and vitals signs, including pulse oximetry, reviewed.  Summary of this visit's results, reviewed by myself:  EKG:  EKG Interpretation  Date/Time:    Ventricular Rate:    PR Interval:    QRS Duration:   QT Interval:    QTC Calculation:   R Axis:     Text Interpretation:         Labs:  Results for orders placed or performed during the hospital encounter of 04/15/20 (from the past 24 hour(s))  Lactic acid, plasma     Status: None   Collection Time: 04/15/20  6:02 PM  Result Value Ref Range   Lactic Acid, Venous 0.9 0.5 - 1.9 mmol/L  Comprehensive metabolic panel     Status: Abnormal   Collection Time: 04/15/20  6:02 PM  Result Value Ref Range   Sodium 138 135 - 145 mmol/L   Potassium 4.1 3.5 - 5.1 mmol/L   Chloride 102 98 - 111 mmol/L   CO2 24 22 - 32 mmol/L   Glucose, Bld 96 70 - 99 mg/dL   BUN 8 6 - 20 mg/dL   Creatinine, Ser 1.32 0.44 - 1.00 mg/dL   Calcium 9.2 8.9 - 44.0 mg/dL   Total Protein 7.9 6.5 - 8.1 g/dL   Albumin 4.5 3.5 - 5.0 g/dL   AST 9 (L) 15 - 41 U/L   ALT 19 0 - 44 U/L   Alkaline Phosphatase 55 38 - 126 U/L   Total Bilirubin 0.7 0.3 - 1.2 mg/dL   GFR, Estimated >10 >27 mL/min   Anion gap 12 5 - 15  CBC with Differential     Status: None   Collection Time: 04/15/20  6:02 PM  Result Value Ref Range   WBC 6.8 4.0 - 10.5 K/uL   RBC 4.54 3.87 - 5.11 MIL/uL   Hemoglobin 12.9 12.0 - 15.0 g/dL   HCT 25.3 66.4 - 40.3 %   MCV 85.9 80.0 - 100.0 fL   MCH 28.4 26.0 - 34.0 pg   MCHC 33.1 30.0 - 36.0 g/dL   RDW 47.4 25.9 - 56.3 %   Platelets 308 150 - 400 K/uL   nRBC 0.0 0.0 - 0.2 %   Neutrophils Relative % 65 %   Neutro Abs 4.4 1.7 - 7.7 K/uL   Lymphocytes Relative 24 %   Lymphs Abs 1.6 0.7 - 4.0 K/uL   Monocytes Relative 11 %   Monocytes Absolute 0.7 0.1 - 1.0 K/uL   Eosinophils Relative 0 %   Eosinophils Absolute 0.0 0.0 - 0.5 K/uL   Basophils Relative 0 %   Basophils Absolute 0.0 0.0 - 0.1 K/uL   Immature Granulocytes 0 %   Abs  Immature Granulocytes 0.03 0.00 - 0.07 K/uL  I-Stat beta hCG blood, ED     Status: None   Collection Time: 04/15/20  6:09 PM  Result Value Ref Range   I-stat hCG, quantitative <5.0 <5 mIU/mL   Comment 3          Resp Panel by RT-PCR (Flu A&B, Covid) Nasopharyngeal Swab     Status: None   Collection Time: 04/15/20  8:30 PM   Specimen: Nasopharyngeal Swab; Nasopharyngeal(NP) swabs in vial transport medium  Result Value Ref Range   SARS Coronavirus 2 by RT PCR NEGATIVE NEGATIVE   Influenza A by PCR NEGATIVE NEGATIVE   Influenza B by PCR NEGATIVE NEGATIVE  Urinalysis, Routine w reflex microscopic Urine, Clean Catch     Status: Abnormal   Collection  Time: 04/15/20  8:37 PM  Result Value Ref Range   Color, Urine YELLOW YELLOW   APPearance CLEAR CLEAR   Specific Gravity, Urine 1.019 1.005 - 1.030   pH 5.0 5.0 - 8.0   Glucose, UA NEGATIVE NEGATIVE mg/dL   Hgb urine dipstick NEGATIVE NEGATIVE   Bilirubin Urine NEGATIVE NEGATIVE   Ketones, ur 5 (A) NEGATIVE mg/dL   Protein, ur NEGATIVE NEGATIVE mg/dL   Nitrite NEGATIVE NEGATIVE   Leukocytes,Ua NEGATIVE NEGATIVE  CSF cell count with differential     Status: None   Collection Time: 04/15/20 11:04 PM  Result Value Ref Range   Tube # 1    Color, CSF COLORLESS COLORLESS   Appearance, CSF CLEAR CLEAR   Supernatant COLORLESS    RBC Count, CSF 0 0 /cu mm   WBC, CSF 1 0 - 5 /cu mm   Other Cells, CSF TOO FEW TO COUNT, SMEAR AVAILABLE FOR REVIEW   CSF culture with Stat gram stain     Status: None (Preliminary result)   Collection Time: 04/15/20 11:04 PM   Specimen: CSF; Cerebrospinal Fluid  Result Value Ref Range   Specimen Description CSF    Special Requests NONE    Gram Stain      NO WBC SEEN ABUNDANT GRAM POSITIVE COCCI IN CLUSTERS Gram Stain Report Called to,Read Back By and Verified With: Beverly Gust, RN @ [phone] ON 04/16/20 Riesa Pope Performed at [facility], [address] [place], [place] [address]    Culture  PENDING    Report Status PENDING   Glucose, CSF     Status: None   Collection Time: 04/15/20 11:04 PM  Result Value Ref Range   Glucose, CSF 56 40 - 70 mg/dL  Protein, CSF     Status: None   Collection Time: 04/15/20 11:04 PM  Result Value Ref Range   Total  Protein, CSF 25 15 - 45 mg/dL  Lactic acid, plasma     Status: None   Collection Time: 04/16/20  2:05 AM  Result Value Ref Range   Lactic Acid, Venous 0.7 0.5 - 1.9 mmol/L  Procalcitonin     Status: None   Collection Time: 04/16/20  2:05 AM  Result Value Ref Range   Procalcitonin <0.10 ng/mL    Imaging Studies: DG Chest 2 View  Result Date: 04/15/2020 CLINICAL DATA:  Headache since yesterday, fever EXAM: CHEST - 2 VIEW COMPARISON:  None. FINDINGS: Frontal and lateral views of the chest demonstrate an unremarkable cardiac silhouette. No airspace disease, effusion, or pneumothorax. No acute bony abnormality. IMPRESSION: 1. No acute intrathoracic process. Electronically Signed   By: Sharlet Salina M.D.   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  The lack of white blood cells in the cerebrospinal fluid is counterintuitive.  This could represent a contaminated specimen but will err on the side of caution pending definitive CSF analysis/culture.  2:19 AM Patient experiencing redness of her neck, face and itching of the left hand.  Benadryl ordered and vancomycin infusion held.  3:08 AM Erythema resolved with IV Benadryl.  Patient also given IV Pepcid and vancomycin will be restarted at half rate.  CRITICAL CARE Performed by: [facility] Total critical care time: 30 minutes Critical care time was exclusive of separately billable procedures and treating other patients. Critical care was necessary to treat or prevent imminent or life-threatening deterioration. Critical care was time spent personally  by me on the following activities:  development of treatment plan with patient and/or surrogate as well as nursing, discussions with consultants, evaluation of patient's response to treatment, examination of patient, obtaining history from patient or surrogate, ordering and performing treatments and interventions, ordering and review of laboratory studies, ordering and review of radiographic studies, pulse oximetry and re-evaluation of patient's condition.       Elvy Mclarty, Jonny Ruiz, MD 04/16/20 [phone]

## 2020-04-16 NOTE — ED Notes (Signed)
Patient denies further reaction to medication. No symptoms of reaction seen.

## 2020-04-16 NOTE — ED Notes (Signed)
Patient states itching has improved. Redness and inflammation noted to have decreased. EDP MD at bedside.

## 2020-04-16 NOTE — ED Notes (Signed)
Assisted Dr. Waymon Amato at patients bedside

## 2020-04-17 DIAGNOSIS — R509 Fever, unspecified: Secondary | ICD-10-CM | POA: Diagnosis present

## 2020-04-17 DIAGNOSIS — Y92239 Unspecified place in hospital as the place of occurrence of the external cause: Secondary | ICD-10-CM | POA: Diagnosis present

## 2020-04-17 DIAGNOSIS — Z8669 Personal history of other diseases of the nervous system and sense organs: Secondary | ICD-10-CM | POA: Diagnosis not present

## 2020-04-17 DIAGNOSIS — R519 Headache, unspecified: Secondary | ICD-10-CM | POA: Diagnosis not present

## 2020-04-17 DIAGNOSIS — J329 Chronic sinusitis, unspecified: Secondary | ICD-10-CM | POA: Diagnosis present

## 2020-04-17 DIAGNOSIS — R5081 Fever presenting with conditions classified elsewhere: Secondary | ICD-10-CM | POA: Diagnosis not present

## 2020-04-17 DIAGNOSIS — G43909 Migraine, unspecified, not intractable, without status migrainosus: Secondary | ICD-10-CM | POA: Diagnosis present

## 2020-04-17 DIAGNOSIS — R3589 Other polyuria: Secondary | ICD-10-CM | POA: Diagnosis present

## 2020-04-17 DIAGNOSIS — Z8616 Personal history of COVID-19: Secondary | ICD-10-CM | POA: Diagnosis not present

## 2020-04-17 DIAGNOSIS — T368X5A Adverse effect of other systemic antibiotics, initial encounter: Secondary | ICD-10-CM | POA: Diagnosis present

## 2020-04-17 DIAGNOSIS — G003 Staphylococcal meningitis: Secondary | ICD-10-CM | POA: Diagnosis present

## 2020-04-17 DIAGNOSIS — Z20822 Contact with and (suspected) exposure to covid-19: Secondary | ICD-10-CM | POA: Diagnosis present

## 2020-04-17 DIAGNOSIS — M542 Cervicalgia: Secondary | ICD-10-CM | POA: Diagnosis present

## 2020-04-17 DIAGNOSIS — E876 Hypokalemia: Secondary | ICD-10-CM | POA: Diagnosis present

## 2020-04-17 DIAGNOSIS — D649 Anemia, unspecified: Secondary | ICD-10-CM | POA: Diagnosis present

## 2020-04-17 DIAGNOSIS — L299 Pruritus, unspecified: Secondary | ICD-10-CM | POA: Diagnosis present

## 2020-04-17 DIAGNOSIS — Z79899 Other long term (current) drug therapy: Secondary | ICD-10-CM | POA: Diagnosis not present

## 2020-04-17 DIAGNOSIS — R059 Cough, unspecified: Secondary | ICD-10-CM | POA: Diagnosis present

## 2020-04-17 LAB — CBC
HCT: 36.8 % (ref 36.0–46.0)
Hemoglobin: 11.8 g/dL — ABNORMAL LOW (ref 12.0–15.0)
MCH: 28.2 pg (ref 26.0–34.0)
MCHC: 32.1 g/dL (ref 30.0–36.0)
MCV: 87.8 fL (ref 80.0–100.0)
Platelets: 293 10*3/uL (ref 150–400)
RBC: 4.19 MIL/uL (ref 3.87–5.11)
RDW: 12.1 % (ref 11.5–15.5)
WBC: 6.7 10*3/uL (ref 4.0–10.5)
nRBC: 0 % (ref 0.0–0.2)

## 2020-04-17 LAB — BASIC METABOLIC PANEL
Anion gap: 9 (ref 5–15)
BUN: 8 mg/dL (ref 6–20)
CO2: 24 mmol/L (ref 22–32)
Calcium: 9.2 mg/dL (ref 8.9–10.3)
Chloride: 106 mmol/L (ref 98–111)
Creatinine, Ser: 0.57 mg/dL (ref 0.44–1.00)
GFR, Estimated: >60 mL/min (ref >60)
Glucose, Bld: 141 mg/dL — ABNORMAL HIGH (ref 70–99)
Potassium: 3.8 mmol/L (ref 3.5–5.1)
Sodium: 139 mmol/L (ref 135–145)

## 2020-04-17 LAB — MAGNESIUM: Magnesium: 2 mg/dL (ref 1.7–2.4)

## 2020-04-17 LAB — VDRL, CSF: VDRL Quant, CSF: NONREACTIVE

## 2020-04-17 NOTE — Progress Notes (Signed)
PROGRESS NOTE   Kathryn Anderson  WCH:[id number]    DOB: 24-Jul-2000    DOA: 04/15/2020  PCP: Lavada Mesi, MD   I have briefly reviewed patients previous medical [facility] in [facility].  Chief Complaint  Patient presents with  . Fever  . Neck Pain    Brief Narrative:  20 year old female, works at a daycare, medical history of migraine headaches who presented to the [facility] ED with complaints of high fevers, headache, neck pain, nausea, vomiting and poor oral intake of approximately 1 to 2 days duration.  Underwent LP in ED.  Diagnosed with acute bacterial meningitis.  ID consulted.   Assessment & Plan:  Active Problems:   Acute bacterial meningitis   Vancomycin adverse reaction, initial encounter   Headache, neck pain, fever: History as noted above.  Reportedly had temperature of 105 F at home.  Interestingly CSF showed 0-1 WBCs, 56 glucose, total protein 25, preliminary Gram stain had shown gram-positive cocci in clusters but subsequently lab corrected this to indicate no organisms.  Blood cultures negative to date. HIV screen nonreactive.  SARS coronavirus 2, influenza a and B PCR negative.  ID was consulted, prior to updated Gram stain results, added ceftriaxone and dexamethasone to vancomycin.  After updated Gram stain results, same were continued for ongoing concern for meningitis and that patient had high fevers and headaches were different than her usual migraine headaches. Patient seen this morning along with ID in the room.  CSF culture negative to date.  Dr. Ninetta Lights recommends continuing antibiotics until 2/12 and if she is at baseline, fever free, cultures remain negative then she can be discharged home off antibiotics with close follow-up.  Stop steroids today.  Alternatively this could be a case of sinusitis and if she continues to report ear discomfort, consider maxillofacial CT in a.m. went home on Augmentin or levofloxacin for 7 days.  Vancomycin adverse  reaction Please see discussion above.  Appears to be mild.  Slowing the rate of infusion.  No further events.  Hypokalemia: Replaced.  Magnesium normal.  Acute anemia Hemoglobin is dropped from 12.9-10.9 which may be correction of her hemoconcentration.  Baseline hemoglobin not known.  Stable.  Body mass index is 29.29 kg/m.    DVT prophylaxis: enoxaparin (LOVENOX) injection 40 mg Start: 04/16/20 1000     Code Status: Full Code Family Communication: Dr. Ninetta Lights and I discussed with patient and her mother at bedside in detail, updated care and answered questions. Disposition:  Status is: Inpatient  The patient will require care spanning > 2 midnights and should be moved to inpatient because: Inpatient level of care appropriate due to severity of illness  Dispo: The patient is from: Home              Anticipated d/c is to: Home              Anticipated d/c date is: 04/18/2020              Patient currently is not medically stable to d/c.   Difficult to place patient No        Consultants:   Infectious disease  Procedures:   Lumbar puncture  Antimicrobials:    Anti-infectives (From admission, onward)   Start     Dose/Rate Route Frequency Ordered Stop   04/16/20 2000  vancomycin (VANCOREADY) IVPB 500 mg/100 mL        500 mg 50 mL/hr over 120 Minutes Intravenous Every 8 hours 04/16/20 1306  04/16/20 1700  vancomycin (VANCOCIN) 500 mg in sodium chloride 0.9 % 100 mL IVPB  Status:  Discontinued        500 mg 50 mL/hr over 120 Minutes Intravenous Every 8 hours 04/16/20 1259 04/16/20 1311   04/16/20 1200  vancomycin (VANCOCIN) IVPB 1000 mg/200 mL premix  Status:  Discontinued        1,000 mg 200 mL/hr over 60 Minutes Intravenous Every 12 hours 04/16/20 0155 04/16/20 0753   04/16/20 1200  vancomycin (VANCOCIN) IVPB 1000 mg/200 mL premix  Status:  Discontinued        1,000 mg 100 mL/hr over 120 Minutes Intravenous Every 12 hours 04/16/20 0753 04/16/20 1259   04/16/20  1200  cefTRIAXone (ROCEPHIN) 2 g in sodium chloride 0.9 % 100 mL IVPB        2 g 200 mL/hr over 30 Minutes Intravenous Every 12 hours 04/16/20 1114     04/16/20 0115  Vancomycin (VANCOCIN) 1,500 mg in sodium chloride 0.9 % 500 mL IVPB  Status:  Discontinued        1,500 mg 250 mL/hr over 120 Minutes Intravenous  Once 04/16/20 0104 04/16/20 0113   04/16/20 0115  vancomycin (VANCOCIN) 1,500 mg in sodium chloride 0.9 % 500 mL IVPB  Status:  Discontinued        1,500 mg 250 mL/hr over 120 Minutes Intravenous  Once 04/16/20 0108 04/16/20 0113   04/16/20 0115  vancomycin (VANCOCIN) IVPB 1000 mg/200 mL premix        1,000 mg 200 mL/hr over 60 Minutes Intravenous  Once 04/16/20 0113 04/16/20 0400   04/15/20 2330  cefTRIAXone (ROCEPHIN) 2 g in sodium chloride 0.9 % 100 mL IVPB        2 g 200 mL/hr over 30 Minutes Intravenous  Once 04/15/20 2326 04/16/20 0103        Subjective:  Mild neck soreness.  Right ear stuffy but not painful.  No photophobia.  Some stomach upset but no nausea or vomiting.  Objective:   Vitals:   04/16/20 1200 04/16/20 1245 04/16/20 2125 04/17/20 0507  BP: 114/81 119/77 111/69 116/80  Pulse: 92 91 95 81  Resp: 16 16 16 16   Temp:  97.7 F (36.5 C) 98.4 F (36.9 C) 97.7 F (36.5 C)  TempSrc:  Oral Oral Oral  SpO2: 98% 98% 97% 97%  Weight:      Height:       Patient was seen and examined along with her mother at bedside.  General exam: Young female, well-built and nourished, lying comfortably propped up in bed.  Oral mucosa moist.  Does not appear septic or toxic. Neck: Supple without stiffness. Respiratory system: Clear to auscultation. Respiratory effort normal. Cardiovascular system: S1 & S2 heard, RRR. No JVD, murmurs, rubs, gallops or clicks. No pedal edema. Gastrointestinal system: Abdomen is nondistended, soft and nontender. No organomegaly or masses felt. Normal bowel sounds heard. Central nervous system: Alert and oriented. No focal neurological  deficits. Extremities: Symmetric 5 x 5 power. Skin: No rashes, lesions or ulcers Psychiatry: Judgement and insight appear normal. Mood & affect appropriate.     Data Reviewed:   I have personally reviewed following labs and imaging studies   CBC: Recent Labs  Lab 04/15/20 1802 04/16/20 0622 04/17/20 0329  WBC 6.8 6.5 6.7  NEUTROABS 4.4 3.1  --   HGB 12.9 10.9* 11.8*  HCT 39.0 33.9* 36.8  MCV 85.9 87.4 87.8  PLT 308 245 293    Basic Metabolic Panel:  Recent Labs  Lab 04/15/20 1802 04/16/20 0622 04/17/20 0329  NA 138 138 139  K 4.1 3.4* 3.8  CL 102 105 106  CO2 24 23 24   GLUCOSE 96 75 141*  BUN 8 8 8   CREATININE 0.71 0.60 0.57  CALCIUM 9.2 8.5* 9.2  MG  --   --  2.0    Liver Function Tests: Recent Labs  Lab 04/15/20 1802 04/16/20 0622  AST 9* 7*  ALT 19 16  ALKPHOS 55 43  BILITOT 0.7 0.6  PROT 7.9 6.4*  ALBUMIN 4.5 3.7    CBG: No results for input(s): GLUCAP in the last 168 hours.  Microbiology Studies:   Recent Results (from the past 240 hour(s))  Resp Panel by RT-PCR (Flu A&B, Covid) Nasopharyngeal Swab     Status: None   Collection Time: 04/15/20  8:30 PM   Specimen: Nasopharyngeal Swab; Nasopharyngeal(NP) swabs in vial transport medium  Result Value Ref Range Status   SARS Coronavirus 2 by RT PCR NEGATIVE NEGATIVE Final    Comment: (NOTE) SARS-CoV-2 target nucleic acids are NOT DETECTED.  The SARS-CoV-2 RNA is generally detectable in upper respiratory specimens during the acute phase of infection. The lowest concentration of SARS-CoV-2 viral copies this assay can detect is 138 copies/mL. A negative result does not preclude SARS-Cov-2 infection and should not be used as the sole basis for treatment or other patient management decisions. A negative result may occur with  improper specimen collection/handling, submission of specimen other than nasopharyngeal swab, presence of viral mutation(s) within the areas targeted by this assay, and  inadequate number of viral copies(<138 copies/mL). A negative result must be combined with clinical observations, patient history, and epidemiological information. The expected result is Negative.  Fact Sheet for Patients:  06/14/20  Fact Sheet for Healthcare Providers:  06/13/20  This test is no t yet approved or cleared by the [employer] FDA and  has been authorized for detection and/or diagnosis of SARS-CoV-2 by FDA under an Emergency Use Authorization (EUA). This EUA will remain  in effect (meaning this test can be used) for the duration of the COVID-19 declaration under Section 564(b)(1) of the Act, 21 U.S.C.section 360bbb-3(b)(1), unless the authorization is terminated  or revoked sooner.       Influenza A by PCR NEGATIVE NEGATIVE Final   Influenza B by PCR NEGATIVE NEGATIVE Final    Comment: (NOTE) The Xpert Xpress SARS-CoV-2/FLU/RSV plus assay is intended as an aid in the diagnosis of influenza from Nasopharyngeal swab specimens and should not be used as a sole basis for treatment. Nasal washings and aspirates are unacceptable for Xpert Xpress SARS-CoV-2/FLU/RSV testing.  Fact Sheet for Patients: [employer]it  Fact Sheet for Healthcare Providers: [place]  This test is not yet approved or cleared by the [employer] FDA and has been authorized for detection and/or diagnosis of SARS-CoV-2 by FDA under an Emergency Use Authorization (EUA). This EUA will remain in effect (meaning this test can be used) for the duration of the COVID-19 declaration under Section 564(b)(1) of the Act, 21 U.S.C. section 360bbb-3(b)(1), unless the authorization is terminated or revoked.  Performed at [facility], [address], [place], [place]   CSF culture with Stat gram stain     Status: None (Preliminary result)    Collection Time: 04/15/20 11:04 PM   Specimen: CSF; Cerebrospinal Fluid  Result Value Ref Range Status   Specimen Description   Final    CSF Performed at [facility], [address], [place], [place] [address]    Special Requests   Final    NONE Performed at [facility], [address] [place], [place] [address]    Gram Stain   Final    CORRECTED RESULTS NO WBC SEEN NO ORGANISMS SEEN PREVIOUSLY REPORTED AS: NO WBC SEEN ABUNDANT GRAM POSITIVE COCCI IN CLUSTERS CORRECTED RESULTS CALLED TO: Coralie Keens [phone] Kirby Funk    Culture   Final    NO GROWTH 1 DAY Performed at [facility], [address] [place], [place] [address]    Report Status PENDING  Incomplete  Blood culture (routine x 2)     Status: None (Preliminary result)   Collection Time: 04/16/20  2:05 AM   Specimen: BLOOD  Result Value Ref Range Status   Specimen Description   Final    BLOOD LEFT ANTECUBITAL Performed at [facility], [address] [place], [place] [address]    Special Requests   Final    BOTTLES DRAWN AEROBIC AND ANAEROBIC Blood Culture results may not be optimal due to an excessive volume of blood received in culture bottles Performed at [facility], [address], [place], [place] [address]    Culture   Final    NO GROWTH 1 DAY Performed at [facility], [address], [place], [place] [address]    Report Status PENDING  Incomplete     Radiology Studies:  DG Chest 2 View  Result Date: 04/15/2020 CLINICAL DATA:  Headache since yesterday, fever EXAM: CHEST - 2 VIEW COMPARISON:  None. FINDINGS: Frontal and lateral views of the chest demonstrate an unremarkable cardiac silhouette. No airspace disease, effusion, or pneumothorax. No acute bony abnormality. IMPRESSION: 1. No acute intrathoracic process. Electronically Signed   By: Sharlet Salina M.D.   On: 04/15/2020 17:58     Scheduled Meds:    . vitamin C  500 mg Oral Daily  . enoxaparin (LOVENOX) injection  40 mg Subcutaneous Q24H  . magnesium oxide  400 mg Oral QHS    Continuous Infusions:   . cefTRIAXone (ROCEPHIN)  IV 2 g (04/17/20 1027)  . vancomycin 500 mg (04/17/20 1318)     LOS: 0 days     Marcellus Scott, MD, [place], [facility]. [facility]    To contact the attending provider between 7A-7P or the covering provider during after hours 7P-7A, please log into the web site [email or web address] and access using universal [place] password for that web site. If you do not have the password, please call the hospital operator.  04/17/2020, 3:25 PM

## 2020-04-17 NOTE — Progress Notes (Signed)
INFECTIOUS DISEASE PROGRESS NOTE  ID: Kathryn Anderson is a 20 y.o. female with  Active Problems:   Acute bacterial meningitis   Vancomycin adverse reaction, initial encounter  Subjective: C/o ear pain.  Polyuria yesterday.  No further vanco rxns  Abtx:  Anti-infectives (From admission, onward)   Start     Dose/Rate Route Frequency Ordered Stop   04/16/20 2000  vancomycin (VANCOREADY) IVPB 500 mg/100 mL        500 mg 50 mL/hr over 120 Minutes Intravenous Every 8 hours 04/16/20 1306     04/16/20 1700  vancomycin (VANCOCIN) 500 mg in sodium chloride 0.9 % 100 mL IVPB  Status:  Discontinued        500 mg 50 mL/hr over 120 Minutes Intravenous Every 8 hours 04/16/20 1259 04/16/20 1311   04/16/20 1200  vancomycin (VANCOCIN) IVPB 1000 mg/200 mL premix  Status:  Discontinued        1,000 mg 200 mL/hr over 60 Minutes Intravenous Every 12 hours 04/16/20 0155 04/16/20 0753   04/16/20 1200  vancomycin (VANCOCIN) IVPB 1000 mg/200 mL premix  Status:  Discontinued        1,000 mg 100 mL/hr over 120 Minutes Intravenous Every 12 hours 04/16/20 0753 04/16/20 1259   04/16/20 1200  cefTRIAXone (ROCEPHIN) 2 g in sodium chloride 0.9 % 100 mL IVPB        2 g 200 mL/hr over 30 Minutes Intravenous Every 12 hours 04/16/20 1114     04/16/20 0115  Vancomycin (VANCOCIN) 1,500 mg in sodium chloride 0.9 % 500 mL IVPB  Status:  Discontinued        1,500 mg 250 mL/hr over 120 Minutes Intravenous  Once 04/16/20 0104 04/16/20 0113   04/16/20 0115  vancomycin (VANCOCIN) 1,500 mg in sodium chloride 0.9 % 500 mL IVPB  Status:  Discontinued        1,500 mg 250 mL/hr over 120 Minutes Intravenous  Once 04/16/20 0108 04/16/20 0113   04/16/20 0115  vancomycin (VANCOCIN) IVPB 1000 mg/200 mL premix        1,000 mg 200 mL/hr over 60 Minutes Intravenous  Once 04/16/20 0113 04/16/20 0400   04/15/20 2330  cefTRIAXone (ROCEPHIN) 2 g in sodium chloride 0.9 % 100 mL IVPB        2 g 200 mL/hr over 30 Minutes Intravenous   Once 04/15/20 2326 04/16/20 0103      Medications:  Scheduled: . vitamin C  500 mg Oral Daily  . enoxaparin (LOVENOX) injection  40 mg Subcutaneous Q24H  . magnesium oxide  400 mg Oral QHS    Objective: Vital signs in last 24 hours: Temp:  [97.7 F (36.5 C)-98.4 F (36.9 C)] 97.7 F (36.5 C) (02/11 0507) Pulse Rate:  [81-97] 81 (02/11 0507) Resp:  [16-18] 16 (02/11 0507) BP: (104-127)/(69-85) 116/80 (02/11 0507) SpO2:  [97 %-99 %] 97 % (02/11 0507)   General appearance: alert, cooperative and no distress Ears: external canla NL both ears, no d/c. non-tender.  Resp: clear to auscultation bilaterally Cardio: regular rate and rhythm GI: normal findings: bowel sounds normal and soft, non-tender Extremities: edema none, no rash  Lab Results Recent Labs    04/16/20 0622 04/17/20 0329  WBC 6.5 6.7  HGB 10.9* 11.8*  HCT 33.9* 36.8  NA 138 139  K 3.4* 3.8  CL 105 106  CO2 23 24  BUN 8 8  CREATININE 0.60 0.57   Liver Panel Recent Labs    04/15/20 1802 04/16/20 0622  PROT 7.9 6.4*  ALBUMIN 4.5 3.7  AST 9* 7*  ALT 19 16  ALKPHOS 55 43  BILITOT 0.7 0.6   Sedimentation Rate No results for input(s): ESRSEDRATE in the last 72 hours. C-Reactive Protein No results for input(s): CRP in the last 72 hours.  Microbiology: Recent Results (from the past 240 hour(s))  Resp Panel by RT-PCR (Flu A&B, Covid) Nasopharyngeal Swab     Status: None   Collection Time: 04/15/20  8:30 PM   Specimen: Nasopharyngeal Swab; Nasopharyngeal(NP) swabs in vial transport medium  Result Value Ref Range Status   SARS Coronavirus 2 by RT PCR NEGATIVE NEGATIVE Final    Comment: (NOTE) SARS-CoV-2 target nucleic acids are NOT DETECTED.  The SARS-CoV-2 RNA is generally detectable in upper respiratory specimens during the acute phase of infection. The lowest concentration of SARS-CoV-2 viral copies this assay can detect is 138 copies/mL. A negative result does not preclude  SARS-Cov-2 infection and should not be used as the sole basis for treatment or other patient management decisions. A negative result may occur with  improper specimen collection/handling, submission of specimen other than nasopharyngeal swab, presence of viral mutation(s) within the areas targeted by this assay, and inadequate number of viral copies(<138 copies/mL). A negative result must be combined with clinical observations, patient history, and epidemiological information. The expected result is Negative.  Fact Sheet for Patients:  BloggerCourse.com  Fact Sheet for Healthcare Providers:  SeriousBroker.it  This test is no t yet approved or cleared by the [place] FDA and  has been authorized for detection and/or diagnosis of SARS-CoV-2 by FDA under an Emergency Use Authorization (EUA). This EUA will remain  in effect (meaning this test can be used) for the duration of the COVID-19 declaration under Section 564(b)(1) of the Act, 21 U.S.C.section 360bbb-3(b)(1), unless the authorization is terminated  or revoked sooner.       Influenza A by PCR NEGATIVE NEGATIVE Final   Influenza B by PCR NEGATIVE NEGATIVE Final    Comment: (NOTE) The Xpert Xpress SARS-CoV-2/FLU/RSV plus assay is intended as an aid in the diagnosis of influenza from Nasopharyngeal swab specimens and should not be used as a sole basis for treatment. Nasal washings and aspirates are unacceptable for Xpert Xpress SARS-CoV-2/FLU/RSV testing.  Fact Sheet for Patients: BloggerCourse.com  Fact Sheet for Healthcare Providers: SeriousBroker.it  This test is not yet approved or cleared by the [place] FDA and has been authorized for detection and/or diagnosis of SARS-CoV-2 by FDA under an Emergency Use Authorization (EUA). This EUA will remain in effect (meaning this test can be used) for the duration of  the COVID-19 declaration under Section 564(b)(1) of the Act, 21 U.S.C. section 360bbb-3(b)(1), unless the authorization is terminated or revoked.  Performed at [facility], [address], [place], [place] [address]   CSF culture with Stat gram stain     Status: None (Preliminary result)   Collection Time: 04/15/20 11:04 PM   Specimen: CSF; Cerebrospinal Fluid  Result Value Ref Range Status   Specimen Description   Final    CSF Performed at [facility], [address], [place], [place] [address]    Special Requests   Final    NONE Performed at [facility], [address], [place], [place] [address]    Gram Stain   Final    CORRECTED RESULTS NO WBC SEEN NO ORGANISMS SEEN PREVIOUSLY REPORTED AS: NO WBC SEEN ABUNDANT GRAM POSITIVE COCCI IN CLUSTERS CORRECTED  RESULTS CALLED TO: Coralie Keens [phone] Kirby Funk Performed at [facility], [address], [place], [place] [address]    Culture PENDING  Incomplete   Report Status PENDING  Incomplete  Blood culture (routine x 2)     Status: None (Preliminary result)   Collection Time: 04/16/20  2:05 AM   Specimen: BLOOD  Result Value Ref Range Status   Specimen Description   Final    BLOOD LEFT ANTECUBITAL Performed at [facility], [address] [place], [place] [address]    Special Requests   Final    BOTTLES DRAWN AEROBIC AND ANAEROBIC Blood Culture results may not be optimal due to an excessive volume of blood received in culture bottles Performed at [facility], [address] [place], [place] [address]    Culture   Final    NO GROWTH 1 DAY Performed at [facility], [address] [place], [place] [address]    Report Status PENDING  Incomplete    Studies/Results: DG Chest 2 View  Result Date: 04/15/2020 CLINICAL DATA:  Headache since yesterday, fever EXAM: CHEST - 2 VIEW COMPARISON:  None. FINDINGS:  Frontal and lateral views of the chest demonstrate an unremarkable cardiac silhouette. No airspace disease, effusion, or pneumothorax. No acute bony abnormality. IMPRESSION: 1. No acute intrathoracic process. Electronically Signed   By: Sharlet Salina M.D.   On: 04/15/2020 17:58     Assessment/Plan: Headache Fever Hx of migraine  Total days of antibiotics: 2 ceftriaxone/vanco  CSF Cx is ngtd Will continue anbx until AM- if she is at baseline, fever free, Cx (-), she can be d/c home off anbx with close f/u.  Stop steroids Alternatively, could consider this as case of sinusitis. If she continues to have ear pain, would consider maxilofacial CT in AM. Consider home with po augmentin 875mg  po bid for 7 more days or levaquin 750 mg po qday for 7 more days.  My great appreciation to Dr excellent care as well as pharmacy assitance.  Dr Richardean Chimera will "chart check" in tomorrow         Drue Second MD, FACP Infectious Diseases (pager) [phone] [email or web address] 04/17/2020, 9:43 AM  LOS: 0 days

## 2020-04-18 DIAGNOSIS — R509 Fever, unspecified: Principal | ICD-10-CM

## 2020-04-18 DIAGNOSIS — R519 Headache, unspecified: Secondary | ICD-10-CM | POA: Diagnosis present

## 2020-04-18 MED ORDER — ONDANSETRON HCL 4 MG PO TABS: 4.0000 mg | ORAL_TABLET | Freq: Three times a day (TID) | ORAL | 0 refills | Status: DC | PRN

## 2020-04-18 NOTE — Plan of Care (Signed)
  Problem: Clinical Measurements: Goal: Diagnostic test results will improve Outcome: Progressing   Problem: Clinical Measurements: Goal: Respiratory complications will improve Outcome: Progressing   Problem: Coping: Goal: Level of anxiety will decrease Outcome: Progressing   Problem: Pain Managment: Goal: General experience of comfort will improve Outcome: Progressing   Problem: Skin Integrity: Goal: Risk for impaired skin integrity will decrease Outcome: Progressing   Problem: Safety: Goal: Ability to remain free from injury will improve Outcome: Progressing

## 2020-04-18 NOTE — Progress Notes (Signed)
Md notified of pt need for nausea medication at discharge due intermittent nausea.

## 2020-04-18 NOTE — Progress Notes (Signed)
Pt stable at time of d/c instructions and education. No needs at this time. Family at bedside.

## 2020-04-18 NOTE — Discharge Instructions (Signed)

## 2020-04-18 NOTE — Discharge Summary (Signed)
Physician Discharge Summary  Kathryn Anderson [id number] DOB: May 27, 2000  PCP: Lavada MesiHilts, Michael, MD  Admitted from: Home Discharged to: Home  Admit date: 04/15/2020 Discharge date: 04/18/2020  Recommendations for Outpatient Follow-up:    Follow-up Information    Hilts, Michael, MD. Schedule an appointment as soon as possible for a visit in 1 week(s).   Specialty: Family Medicine Why: To be seen with repeat labs (CBC & BMP). Contact information: [address] [place] [address] [phone]                Home Health: None    Equipment/Devices: None    Discharge Condition: Improved and stable   Code Status: Full Code Diet recommendation:  Discharge Diet Orders (From admission, onward)    Start     Ordered   04/18/20 0000  Diet general        04/18/20 1124           Discharge Diagnoses:  Principal Problem:   Fever Active Problems:   Vancomycin adverse reaction, initial encounter   Headache   Brief Summary: 20 year old female, works at a daycare, medical history of migraine headaches who [facility] with complaints of high fevers, headache, neck pain, nausea, vomiting and poor oral intake of approximately 1 to 2 days duration.  Underwent LP in ED. initially suspected to have acute bacterial meningitis but that was ruled out upon work-up.  Etiology of her fever, headache unclear.   Assessment & Plan:   Headache, neck pain, fever: History as noted above.  Reportedly had temperature of 104.5 F at home.  Interestingly CSF showed 0-1 WBCs, 56 glucose, total protein 25, preliminary Gram stain was reported as gram-positive cocci in clusters but subsequently lab corrected this to indicate no organisms.  Blood cultures negative to date. HIV screen nonreactive.  SARS coronavirus 2, influenza A and B PCR negative.  Pregnancy test negative. ID was consulted, prior to the updated Gram stain results, due to concern for acute bacterial  meningitis, they added ceftriaxone and dexamethasone to vancomycin.  After updated Gram stain results which showed no organisms, same antibiotics were continued for 48 hours due to history of high fever.   Patient seen 04/17/2020 morning along with ID in the room.  CSF culture negative to date.  Dr. Ninetta LightsHatcher recommends continuing antibiotics until 2/12 and if she is at baseline, fever free, cultures remain negative then she can be discharged home off antibiotics with close follow-up.  Stopped steroids 2/11.  Alternatively this could be a case of sinusitis and if she continues to report ear discomfort, consider maxillofacial CT in a.m. went home on Augmentin or levofloxacin for 7 days.  On day of discharge, patient reported that she had transient headache on the vertex approximately 5-6/10 at around 4 PM on 2/11, she took a dose of Tylenol, subsequently went to sleep and when she woke up this morning without complaints.  She did not report headache, neck pain or earache this morning.  No recurrence of fever or chills.  Reports intermittent nausea without vomiting.  I discussed patient's care in detail with infectious disease MD on-call, Dr. Drue SecondSnider, CSF cultures continue to be negative, she recommended discharging patient on no antimicrobials and feels like patient's headache may have been an episode of migraine.  She also recommends return to work when patient feels ready. Close outpatient follow-up with PCP.  Patient and mother instructed to seek immediate medical attention if there is any worsening of her condition  or lack of sustained improvement to normalcy.  They verbalized understanding.  Vancomycin adverse reaction Please see discussion above.  Appears to be mild.  Slowing the rate of infusion.  No further events.  Added vancomycin to her allergy list.  Hypokalemia: Replaced.  Magnesium normal.  Acute anemia Hemoglobin is dropped from 12.9-10.9 which may be correction of her hemoconcentration.   Baseline hemoglobin not known.  Stable.  Body mass index is 29.29 kg/m.  History of migraine headaches Reports that she usually gets this about twice a month.  Headache is usually frontal/bitemporal and at vertex, associated with sensitivity to light and noise.  Usually takes 600 mg of Motrin with good effect/resolution.  Has no clear precipitants.  Indicates that current headache was not similar to her migraine headaches.  Current mild nausea may be related to migraine versus antibiotic/steroids that she was in the hospital versus other etiology.  Brief supply of Zofran given to patient at their request.     Consultants:   Infectious disease  Procedures:   Lumbar puncture    Discharge Instructions  Discharge Instructions    Call MD for:  extreme fatigue   Complete by: As directed    Call MD for:  persistant dizziness or light-headedness   Complete by: As directed    Call MD for:  persistant nausea and vomiting   Complete by: As directed    Call MD for:  severe uncontrolled pain   Complete by: As directed    Call MD for:  temperature >100.4   Complete by: As directed    Diet general   Complete by: As directed    Increase activity slowly   Complete by: As directed        Medication List    STOP taking these medications   amoxicillin 875 MG tablet Commonly known as: AMOXIL     TAKE these medications   cholecalciferol 25 MCG (1000 UNIT) tablet Commonly known as: VITAMIN D3 Take 1,000 Units by mouth daily.   ibuprofen 200 MG tablet Commonly known as: ADVIL Take 200 mg by mouth every 6 (six) hours as needed for headache or mild pain.   magnesium 30 MG tablet Take 30 mg by mouth at bedtime.   ondansetron 4 MG tablet Commonly known as: ZOFRAN Take 1 tablet (4 mg total) by mouth every 8 (eight) hours as needed for nausea or vomiting.   vitamin C 500 MG tablet Commonly known as: ASCORBIC ACID Take 500 mg by mouth daily.      Allergies  Allergen  Reactions  . Vancomycin Itching    Patient had itching of her scalp and hands after initial dose of vancomycin.  Subsequently tolerated well with Benadryl.      Procedures/Studies: DG Chest 2 View  Result Date: 04/15/2020 CLINICAL DATA:  Headache since yesterday, fever EXAM: CHEST - 2 VIEW COMPARISON:  None. FINDINGS: Frontal and lateral views of the chest demonstrate an unremarkable cardiac silhouette. No airspace disease, effusion, or pneumothorax. No acute bony abnormality. IMPRESSION: 1. No acute intrathoracic process. Electronically Signed   By: Sharlet Salina M.D.   On: 04/15/2020 17:58      Subjective: Patient was interviewed and examined along with her mother and female nurse tech as chaperone in the room.  Patient denies complaints this morning.  No headache, neck pain or earache reported.  Did complain of mild intermittent nausea but no vomiting.  According to mother, ate a good breakfast.  She did report headache last evening as  noted above which resolved after sleeping overnight.  Discharge Exam:  Vitals:   04/17/20 0507 04/17/20 1535 04/17/20 2040 04/18/20 0545  BP: 116/80 98/64 114/73 111/70  Pulse: 81 88 80 66  Resp: 16 17 16 16   Temp: 97.7 F (36.5 C) 97.9 F (36.6 C) 98.2 F (36.8 C) 97.8 F (36.6 C)  TempSrc: Oral Oral Oral Oral  SpO2: 97% 99% 98% 98%  Weight:      Height:        General exam: Young female, well-built and nourished, sitting up comfortably in bed without distress.  Oral mucosa moist.  Did not look septic or toxic. Neck: Supple without stiffness. Respiratory system: Clear to auscultation. Respiratory effort normal. Cardiovascular system: S1 & S2 heard, RRR. No JVD, murmurs, rubs, gallops or clicks. No pedal edema. Gastrointestinal system: Abdomen is nondistended, soft and nontender. No organomegaly or masses felt. Normal bowel sounds heard. Central nervous system: Alert and oriented. No focal neurological deficits. Extremities: Symmetric 5 x 5  power. Skin: No rashes, lesions or ulcers Psychiatry: Judgement and insight appear normal. Mood & affect appropriate.     The results of significant diagnostics from this hospitalization (including imaging, microbiology, ancillary and laboratory) are listed below for reference.     Microbiology: Recent Results (from the past 240 hour(s))  Resp Panel by RT-PCR (Flu A&B, Covid) Nasopharyngeal Swab     Status: None   Collection Time: 04/15/20  8:30 PM   Specimen: Nasopharyngeal Swab; Nasopharyngeal(NP) swabs in vial transport medium  Result Value Ref Range Status   SARS Coronavirus 2 by RT PCR NEGATIVE NEGATIVE Final    Comment: (NOTE) SARS-CoV-2 target nucleic acids are NOT DETECTED.  The SARS-CoV-2 RNA is generally detectable in upper respiratory specimens during the acute phase of infection. The lowest concentration of SARS-CoV-2 viral copies this assay can detect is 138 copies/mL. A negative result does not preclude SARS-Cov-2 infection and should not be used as the sole basis for treatment or other patient management decisions. A negative result may occur with  improper specimen collection/handling, submission of specimen other than nasopharyngeal swab, presence of viral mutation(s) within the areas targeted by this assay, and inadequate number of viral copies(<138 copies/mL). A negative result must be combined with clinical observations, patient history, and epidemiological information. The expected result is Negative.  Fact Sheet for Patients:  06/13/20  Fact Sheet for Healthcare Providers:  BloggerCourse.com  This test is no t yet approved or cleared by the [place] FDA and  has been authorized for detection and/or diagnosis of SARS-CoV-2 by FDA under an Emergency Use Authorization (EUA). This EUA will remain  in effect (meaning this test can be used) for the duration of the COVID-19 declaration under Section  564(b)(1) of the Act, 21 U.S.C.section 360bbb-3(b)(1), unless the authorization is terminated  or revoked sooner.       Influenza A by PCR NEGATIVE NEGATIVE Final   Influenza B by PCR NEGATIVE NEGATIVE Final    Comment: (NOTE) The Xpert Xpress SARS-CoV-2/FLU/RSV plus assay is intended as an aid in the diagnosis of influenza from Nasopharyngeal swab specimens and should not be used as a sole basis for treatment. Nasal washings and aspirates are unacceptable for Xpert Xpress SARS-CoV-2/FLU/RSV testing.  Fact Sheet for Patients: [place]  Fact Sheet for Healthcare Providers: BloggerCourse.com  This test is not yet approved or cleared by the [place] FDA and has been authorized for detection and/or diagnosis of SARS-CoV-2 by FDA under an Emergency Use Authorization (EUA). This  EUA will remain in effect (meaning this test can be used) for the duration of the COVID-19 declaration under Section 564(b)(1) of the Act, 21 U.S.C. section 360bbb-3(b)(1), unless the authorization is terminated or revoked.  Performed at [facility], [address] [place], [place] [address]   CSF culture with Stat gram stain     Status: None (Preliminary result)   Collection Time: 04/15/20 11:04 PM   Specimen: CSF; Cerebrospinal Fluid  Result Value Ref Range Status   Specimen Description   Final    CSF Performed at [facility], [address], [place], [place] [address]    Special Requests   Final    NONE Performed at [facility], [address] [place], [place] [address]    Gram Stain   Final    CORRECTED RESULTS NO WBC SEEN NO ORGANISMS SEEN PREVIOUSLY REPORTED AS: NO WBC SEEN ABUNDANT GRAM POSITIVE COCCI IN CLUSTERS CORRECTED RESULTS CALLED TO: Coralie Keens [phone] Kirby Funk    Culture   Final    NO GROWTH 1 DAY Performed at [facility], [address], [place], [place] [address]    Report Status PENDING  Incomplete  Blood culture (routine x 2)     Status: None (Preliminary result)   Collection Time: 04/16/20  2:05 AM   Specimen: BLOOD  Result Value Ref Range Status   Specimen Description   Final    BLOOD LEFT ANTECUBITAL Performed at [facility], [address], [place], [place] [address]    Special Requests   Final    BOTTLES DRAWN AEROBIC AND ANAEROBIC Blood Culture results may not be optimal due to an excessive volume of blood received in culture bottles Performed at [facility], [address] [place], [place] [address]    Culture   Final    NO GROWTH 1 DAY Performed at [facility], [address] [place], [place] [address]    Report Status PENDING  Incomplete     Labs: CBC: Recent Labs  Lab 04/15/20 1802 04/16/20 0622 04/17/20 0329  WBC 6.8 6.5 6.7  NEUTROABS 4.4 3.1  --   HGB 12.9 10.9* 11.8*  HCT 39.0 33.9* 36.8  MCV 85.9 87.4 87.8  PLT 308 245 293    Basic Metabolic Panel: Recent Labs  Lab 04/15/20 1802 04/16/20 0622 04/17/20 0329  NA 138 138 139  K 4.1 3.4* 3.8  CL 102 105 106  CO2 24 23 24   GLUCOSE 96 75 141*  BUN 8 8 8   CREATININE 0.71 0.60 0.57  CALCIUM 9.2 8.5* 9.2  MG  --   --  2.0    Liver Function Tests: Recent Labs  Lab 04/15/20 1802 04/16/20 0622  AST 9* 7*  ALT 19 16  ALKPHOS 55 43  BILITOT 0.7 0.6  PROT 7.9 6.4*  ALBUMIN 4.5 3.7    Urinalysis    Component Value Date/Time   COLORURINE YELLOW 04/15/2020 2037   APPEARANCEUR CLEAR 04/15/2020 2037   LABSPEC 1.019 04/15/2020 2037   PHURINE 5.0 04/15/2020 2037   GLUCOSEU NEGATIVE 04/15/2020 2037   HGBUR NEGATIVE 04/15/2020 2037   BILIRUBINUR NEGATIVE 04/15/2020 2037   KETONESUR 5 (A) 04/15/2020 2037   PROTEINUR NEGATIVE 04/15/2020 2037   NITRITE NEGATIVE 04/15/2020 2037   LEUKOCYTESUR NEGATIVE 04/15/2020 2037    I discussed in detail with patient's daughter at bedside and  again later via phone, updated care and answered  all questions.  She was appreciative of the updates.  Time coordinating discharge: 25 minutes  SIGNED:  Marcellus Scott, MD, FACP, [facility]. [facility]  To contact the attending provider between 7A-7P or the covering provider during after hours 7P-7A, please log into the web site [email or web address] and access using universal  password for that web site. If you do not have the password, please call the hospital operator.

## 2020-04-19 LAB — CSF CULTURE W GRAM STAIN
Culture: NO GROWTH
Gram Stain: NONE SEEN

## 2020-04-20 ENCOUNTER — Ambulatory Visit: Payer: Commercial Managed Care - PPO | Admitting: Physical Therapy

## 2020-04-20 ENCOUNTER — Encounter: Payer: Self-pay | Admitting: Family Medicine

## 2020-04-21 ENCOUNTER — Encounter: Payer: Self-pay | Admitting: Family Medicine

## 2020-04-22 ENCOUNTER — Encounter: Payer: Self-pay | Admitting: Family Medicine

## 2020-04-22 ENCOUNTER — Other Ambulatory Visit: Payer: Self-pay

## 2020-04-22 ENCOUNTER — Ambulatory Visit (INDEPENDENT_AMBULATORY_CARE_PROVIDER_SITE_OTHER): Payer: Commercial Managed Care - PPO | Admitting: Family Medicine

## 2020-04-22 VITALS — BP 114/71 | HR 102 | Ht 60.0 in | Wt 154.2 lb

## 2020-04-22 DIAGNOSIS — R509 Fever, unspecified: Secondary | ICD-10-CM | POA: Diagnosis not present

## 2020-04-22 DIAGNOSIS — R42 Dizziness and giddiness: Secondary | ICD-10-CM

## 2020-04-22 DIAGNOSIS — G4485 Primary stabbing headache: Secondary | ICD-10-CM | POA: Diagnosis not present

## 2020-04-22 DIAGNOSIS — R519 Headache, unspecified: Secondary | ICD-10-CM | POA: Diagnosis not present

## 2020-04-22 DIAGNOSIS — D649 Anemia, unspecified: Secondary | ICD-10-CM

## 2020-04-22 LAB — CULTURE, BLOOD (ROUTINE X 2): Culture: NO GROWTH

## 2020-04-22 MED ORDER — PROMETHAZINE HCL 12.5 MG PO TABS: 6.2500 mg | ORAL_TABLET | Freq: Four times a day (QID) | ORAL | 1 refills | Status: DC | PRN

## 2020-04-22 NOTE — Progress Notes (Signed)
   Office Visit Note   Patient: Kathryn Anderson           Date of Birth: 2000-07-10           MRN: [id number] Visit Date: 04/22/2020 Requested by: Lavada Mesi, MD [address] [place],  [place] [address] PCP: Lavada Mesi, MD  Subjective: Chief Complaint  Patient presents with  . Other    Hospital follow up - still no exact diagnosis per Mom    HPI: She is here for hospital follow-up.  She was hospitalized for possible meningitis.  Test results were negative.  She was treated with antibiotics until results came back.  She has continued to have headache, unlike her normal migraines.  She also feels dizzy and nauseated throughout the day.  Her appetite is diminished.  She no longer has a stiff neck.  She gets some fullness in her ears occasionally, no further sore throat.  Low-grade temperature of 100.4 yesterday.  She does not feel like her normal self at all.                ROS:   All other systems were reviewed and are negative.  Objective: Vital Signs: BP 114/71   Pulse (!) 102   Ht 5' (1.524 m)   Wt 154 lb 3.2 oz (69.9 kg)   BMI 30.12 kg/m   Physical Exam:  General:  Alert and oriented, in no acute distress. Pulm:  Breathing unlabored. Psy:  Normal mood, congruent affect. Skin: No rash HEENT:  [place]/AT, PERRLA, EOM Full, no nystagmus.  Funduscopic examination within normal limits.  No conjunctival erythema.  Tympanic membranes are pearly gray with normal landmarks.  External ear canals are normal.  Nasal passages are clear.  Oropharynx is clear.  No significant lymphadenopathy.  No thyromegaly or nodules.  2+ carotid pulses without bruits. CV: Regular rate and rhythm without murmurs, rubs, or gallops.  No peripheral edema.  2+ radial and posterior tibial pulses. Lungs: Clear to auscultation throughout with no wheezing or areas of consolidation.    Imaging: No results found.  Assessment & Plan: 1.  Status post recent hospitalization for fever, stiff neck.  Still  having headache and dizziness. -We will recheck labs today.  Ordered MRI of the brain.  Out of work through next week.  Phenergan as needed.     Procedures: No procedures performed        PMFS History: Patient Active Problem List   Diagnosis Date Noted  . Fever 04/18/2020  . Headache 04/18/2020  . Vancomycin adverse reaction, initial encounter 04/16/2020  . Chronic migraine without aura without status migrainosus, not intractable 01/28/2020  . Chronic pain of both knees 01/28/2020  . Ankle ligament laxity, left 12/19/2019   History reviewed. No pertinent past medical history.  Family History  Problem Relation Age of Onset  . Healthy Mother   . Healthy Father   . Macular degeneration Maternal Grandmother   . Glaucoma Maternal Grandmother   . Dementia Paternal Grandfather   . Heart disease Neg Hx     History reviewed. No pertinent surgical history. Social History   Occupational History  . Not on file  Tobacco Use  . Smoking status: Never Smoker  . Smokeless tobacco: Never Used  Substance and Sexual Activity  . Alcohol use: Never  . Drug use: Never  . Sexual activity: Not on file

## 2020-04-22 NOTE — Addendum Note (Signed)
Addended by: Lillia Carmel on: 04/22/2020 03:55 PM   Modules accepted: Orders

## 2020-04-23 ENCOUNTER — Telehealth: Payer: Self-pay | Admitting: Family Medicine

## 2020-04-23 LAB — CBC WITH DIFFERENTIAL/PLATELET
Absolute Monocytes: 677 cells/uL (ref 200–950)
Basophils Absolute: 167 cells/uL (ref 0–200)
Basophils Relative: 1.5 %
Eosinophils Absolute: 155 cells/uL (ref 15–500)
Eosinophils Relative: 1.4 %
HCT: 40.6 % (ref 35.0–45.0)
Hemoglobin: 13.6 g/dL (ref 11.7–15.5)
Lymphs Abs: 3652 cells/uL (ref 850–3900)
MCH: 28.2 pg (ref 27.0–33.0)
MCHC: 33.5 g/dL (ref 32.0–36.0)
MCV: 84.2 fL (ref 80.0–100.0)
MPV: 8.9 fL (ref 7.5–12.5)
Monocytes Relative: 6.1 %
Neutro Abs: 6449 cells/uL (ref 1500–7800)
Neutrophils Relative %: 58.1 %
Platelets: 455 10*3/uL — ABNORMAL HIGH (ref 140–400)
RBC: 4.82 10*6/uL (ref 3.80–5.10)
RDW: 12.5 % (ref 11.0–15.0)
Total Lymphocyte: 32.9 %
WBC: 11.1 10*3/uL — ABNORMAL HIGH (ref 3.8–10.8)

## 2020-04-23 LAB — VITAMIN D 25 HYDROXY (VIT D DEFICIENCY, FRACTURES): Vit D, 25-Hydroxy: 48 ng/mL (ref 30–100)

## 2020-04-23 LAB — IRON,TIBC AND FERRITIN PANEL
%SAT: 28 % (calc) (ref 16–45)
Ferritin: 26 ng/mL (ref 16–154)
Iron: 99 ug/dL (ref 40–190)
TIBC: 352 mcg/dL (calc) (ref 250–450)

## 2020-04-23 LAB — BASIC METABOLIC PANEL
BUN: 13 mg/dL (ref 7–25)
CO2: 25 mmol/L (ref 20–32)
Calcium: 9.3 mg/dL (ref 8.6–10.2)
Chloride: 102 mmol/L (ref 98–110)
Creat: 0.58 mg/dL (ref 0.50–1.10)
Glucose, Bld: 102 mg/dL — ABNORMAL HIGH (ref 65–99)
Potassium: 4.4 mmol/L (ref 3.5–5.3)
Sodium: 137 mmol/L (ref 135–146)

## 2020-04-23 LAB — THYROID PANEL WITH TSH
Free Thyroxine Index: 2.8 (ref 1.4–3.8)
T3 Uptake: 28 % (ref 22–35)
T4, Total: 9.9 ug/dL (ref 5.3–11.7)
TSH: 0.7 mIU/L

## 2020-04-23 LAB — C-REACTIVE PROTEIN: CRP: 4.7 mg/L (ref <8.0)

## 2020-04-23 NOTE — Telephone Encounter (Signed)
Ferritin level is borderline-low, suggesting mild iron-deficiency.  May want to consider over-the-counter ferrous gluconate at 325 mg daily for a few months (wait until nausea is better, though).  WBC count is borderline elevated, but CRP is normal, so I doubt there is infection right now.  Non-fasting glucose of 102 is fine (if fasting, it would be considered prediabetes, but a non-fasting number of 102 is normal).  All else looks good.

## 2020-04-24 ENCOUNTER — Ambulatory Visit: Payer: Commercial Managed Care - PPO | Admitting: Family Medicine

## 2020-04-27 ENCOUNTER — Ambulatory Visit: Payer: Commercial Managed Care - PPO | Attending: Orthopaedic Surgery | Admitting: Physical Therapy

## 2020-04-27 ENCOUNTER — Encounter: Payer: Self-pay | Admitting: Physical Therapy

## 2020-04-27 ENCOUNTER — Other Ambulatory Visit: Payer: Self-pay

## 2020-04-27 ENCOUNTER — Ambulatory Visit: Payer: Commercial Managed Care - PPO | Admitting: Family Medicine

## 2020-04-27 DIAGNOSIS — M6281 Muscle weakness (generalized): Secondary | ICD-10-CM | POA: Diagnosis present

## 2020-04-27 DIAGNOSIS — M25572 Pain in left ankle and joints of left foot: Secondary | ICD-10-CM | POA: Diagnosis not present

## 2020-04-27 DIAGNOSIS — R29898 Other symptoms and signs involving the musculoskeletal system: Secondary | ICD-10-CM | POA: Diagnosis present

## 2020-04-27 DIAGNOSIS — M25672 Stiffness of left ankle, not elsewhere classified: Secondary | ICD-10-CM

## 2020-04-27 NOTE — Therapy (Signed)
Kathryn Anderson [address]  [place], [place], [address] Phone: [phone]   Fax:  [phone]  Physical Therapy Treatment  Patient Details  Name: Kathryn Anderson MRN: [id number] Date of Birth: 2000-04-24 Referring Provider (PT): Joni Fears, MD   Encounter Date: 04/27/2020   PT End of Session - 04/27/20 1537    Visit Number 3    Number of Visits 8    Date for PT Re-Evaluation 05/25/20    Authorization Type [employer]    PT Start Time 1537   Pt in BR   PT Stop Time 1615    PT Time Calculation (min) 38 min    Activity Tolerance Patient tolerated treatment well    Behavior During Therapy [facility] for tasks assessed/performed           History reviewed. No pertinent past medical history.  History reviewed. No pertinent surgical history.  There were no vitals filed for this visit.   Subjective Assessment - 04/27/20 1540    Subjective Pt reports she was admitted to the hospital fro 4 days and treated for bacterial meningitis. She notes the dizziness and lightheadedness has resolved as of yesterday but still having some headaches, although she also notes h/o migraines. She states her ankle is still "poppy" but does not hurt. Green TB has gotten easier but star exercises have bothered knees.    Patient Stated Goals "to keep from spraining her L ankle"    Currently in Pain? No/denies                             [facility] Adult PT Treatment/Exercise - 04/27/20 1537      Exercises   Exercises Ankle      Knee/Hip Exercises: Standing   Hip Flexion Left;Right;10 reps;Stengthening;Knee straight    Hip Flexion Limitations green TB at ankle; intermittent 1 pole A with L SLS    Hip ADduction Left;Right;10 reps;Strengthening    Hip ADduction Limitations green TB at ankle; intermittent 1 pole A with L SLS    Hip Abduction Left;Right;10 reps;Stengthening;Knee straight    Abduction Limitations green TB at ankle;  intermittent 1 pole A with L SLS    Hip Extension Left;Right;10 reps;Stengthening;Knee straight    Extension Limitations green TB at ankle; intermittent 1 pole A with L SLS      Ankle Exercises: Aerobic   Recumbent Bike L3 x 6 min      Ankle Exercises: Standing   Vector Stance Left;Right    Vector Stance Limitations 3-way star x 10 - standing on blue foam oval    SLS R/L SLS on inverted BOSU 2 x 30 sec - 2nd set with ball toss    Rocker Board Limitations Inverted BOSU: heel-toe & lateral wt shifts x 10 each, stationary & moving ball toss, goblet mini-squat with blue med ball x 15    Other Standing Ankle Exercises R/L SLS + B pallof press                       PT Long Term Goals - 03/30/20 1537      PT LONG TERM GOAL #1   Title Patient will be independent with ongoing/advanced HEP +/- gym program for self-management at home    Status Partially Met    Target Date 05/25/20      PT LONG TERM GOAL #2   Title Patient will demonstrate improved  L ankle & proximal LE strength to >/= 5-/5 for improved stability and ease of mobility    Status On-going    Target Date 05/25/20      PT LONG TERM GOAL #3   Title Patient will report no limitation with floor transfers at work due L ankle laxity/weakness    Status On-going    Target Date 05/25/20      PT LONG TERM GOAL #4   Title Patient will report no further instances of L ankle instability/sprains while playing volleyball    Status On-going    Target Date 05/25/20                 Plan - 04/27/20 1544    Clinical Impression Statement Maurene returning to PT after extended absence due to hospitalization for presumed bacterial meningitis. She reports HEP going well other than star/clock exercises limited due to knee pain - review of HEP revealing excessive forward translation of knee past toes on stance leg when performing star/clock - knee pain resolved after correction of knee position on stance leg. Progressed  strengthening and proprioceptive training with addition of unstable surfaces with good tolerance. Pt requesting blue TB for 4-way hip progression with HEP.    Comorbidities Chronic B knee pain, chronic migraines    Rehab Potential Excellent    PT Frequency 1x / week   pt only able to come 1x/wk   PT Duration 8 weeks    PT Treatment/Interventions ADLs/Self Care Home Management;Cryotherapy;Electrical Stimulation;Iontophoresis 16m/ml Dexamethasone;Moist Heat;Ultrasound;Gait training;Stair training;Functional mobility training;Therapeutic activities;Therapeutic exercise;Balance training;Neuromuscular re-education;Patient/family education;Manual techniques;Passive range of motion;Dry needling;Taping;Vasopneumatic Device;Joint Manipulations    PT Next Visit Plan progress L ankle & proximal LE strengthening and proprioceptive training    Consulted and Agree with Plan of Care Patient           Patient will benefit from skilled therapeutic intervention in order to improve the following deficits and impairments:  Decreased activity tolerance,Decreased balance,Decreased endurance,Decreased mobility,Decreased range of motion,Decreased strength,Difficulty walking,Hypermobility,Increased fascial restricitons,Increased muscle spasms,Impaired perceived functional ability,Impaired flexibility,Improper body mechanics,Postural dysfunction,Pain  Visit Diagnosis: Pain in left ankle and joints of left foot  Muscle weakness (generalized)  Other symptoms and signs involving the musculoskeletal system  Stiffness of left ankle, not elsewhere classified     Problem List Patient Active Problem List   Diagnosis Date Noted  . Fever 04/18/2020  . Headache 04/18/2020  . Vancomycin adverse reaction, initial encounter 04/16/2020  . Chronic migraine without aura without status migrainosus, not intractable 01/28/2020  . Chronic pain of both knees 01/28/2020  . Ankle ligament laxity, left 12/19/2019    JPercival Spanish PT, MPT 04/27/2020, 4:16 PM  [facility] [address]Phone: [phone]  Fax:  [phone] Name: Kathryn RotunnoMRN: [id number] of Birth: 201-03-2001

## 2020-04-28 ENCOUNTER — Encounter: Payer: Self-pay | Admitting: Family Medicine

## 2020-05-04 ENCOUNTER — Other Ambulatory Visit: Payer: Self-pay

## 2020-05-04 ENCOUNTER — Encounter: Payer: Self-pay | Admitting: Physical Therapy

## 2020-05-04 ENCOUNTER — Ambulatory Visit: Payer: Commercial Managed Care - PPO | Admitting: Physical Therapy

## 2020-05-04 DIAGNOSIS — M25572 Pain in left ankle and joints of left foot: Secondary | ICD-10-CM | POA: Diagnosis not present

## 2020-05-04 DIAGNOSIS — M6281 Muscle weakness (generalized): Secondary | ICD-10-CM

## 2020-05-04 DIAGNOSIS — M25672 Stiffness of left ankle, not elsewhere classified: Secondary | ICD-10-CM

## 2020-05-04 DIAGNOSIS — R29898 Other symptoms and signs involving the musculoskeletal system: Secondary | ICD-10-CM

## 2020-05-04 NOTE — Therapy (Signed)
[facility] [place] [address]  [address] [place], [place], [address] Phone: [phone]   Fax:  [phone]  Physical Therapy Treatment  Patient Details  Name: Kathryn Anderson MRN: [id number] Date of Birth: 08/21/2000 Referring Provider (PT): Peter Whitfield, MD   Encounter Date: 05/04/2020   PT End of Session - 05/04/20 1534    Visit Number 4    Number of Visits 8    Date for PT Re-Evaluation 05/25/20    Authorization Type [employer]    PT Start Time 1534    PT Stop Time 1621    PT Time Calculation (min) 47 min    Activity Tolerance Patient tolerated treatment well    Behavior During Therapy WFL for tasks assessed/performed           History reviewed. No pertinent past medical history.  History reviewed. No pertinent surgical history.  There were no vitals filed for this visit.   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 She also notes she still wears her brace when she plays volleyball just for the sense of security but denies problems when playing.    Patient Stated Goals "to keep from spraining her L ankle"    Currently in Pain? No/denies                             [facility] Adult PT Treatment/Exercise - 05/04/20 1534      Exercises   Exercises Ankle      Knee/Hip Exercises: Standing   Hip Flexion Left;Right;10 reps;Stengthening;Knee straight    Hip Flexion Limitations green TB at ankle + opp LE fwd step-up; intermittent 1-2 pole A for balance   pt noting popping in R hip with flexion kick-out   Forward Lunges Left;Right;10 reps;3 seconds    Forward Lunges Limitations R foot suspended in TRX; 2 pole A for balance    Hip ADduction Left;Right;10 reps;Strengthening    Hip ADduction Limitations green TB at ankle + opp LE lateral step-up; intermittent  1-2 pole A for balance    Hip Abduction Left;Right;10 reps;Stengthening;Knee straight    Abduction Limitations green TB at ankle + opp LE corssover lateral step-up; intermittent 1-2 pole A for balance    Hip Extension Left;Right;10 reps;Stengthening;Knee straight    Extension Limitations green TB at ankle + opp LE reverse step-up; intermittent 1-2 pole A for balance    Functional Squat 2 sets;10 reps;5 seconds    Functional Squat Limitations TRX triple extension    Other Standing Knee Exercises Fwd/back monster walk with lopped green TB at ankles 2 x 25ft      Ankle Exercises: Aerobic   Recumbent Bike L3 x 6 min      Ankle Exercises: Standing   Heel Raises Both;10 reps;Left;3 seconds   2 sets   Heel Raises Limitations L eccentric lowering with negative heel over edge of 4" step    Side Shuffle (Round Trip) B side shuffle with looped green TB at ankles 2 x 25 ft    Other Standing Ankle Exercises R/L SLS on blue foam oval + B pallof press x 10                       PT Long Term Goals - 03/30/20 1537        PT LONG TERM GOAL #1   Title Patient will be independent with ongoing/advanced HEP +/- gym program for self-management at home    Status Partially Met    Target Date 05/25/20      PT LONG TERM GOAL #2   Title Patient will demonstrate improved L ankle & proximal LE strength to >/= 5-/5 for improved stability and ease of mobility    Status On-going    Target Date 05/25/20      PT LONG TERM GOAL #3   Title Patient will report no limitation with floor transfers at work due L ankle laxity/weakness    Status On-going    Target Date 05/25/20      PT LONG TERM GOAL #4   Title Patient will report no further instances of L ankle instability/sprains while playing volleyball    Status On-going    Target Date 05/25/20                 Plan - 05/04/20 1544    Clinical Impression Statement Kathryn Anderson reports her ankle has been doing well with no issues while playing  volleyball, but she admits she still wears her brace for a sense of security. She reports she has been gradually transitioning to the blue TB with her standing 4-way SLR in her HEP, noting more challenge in maintaining stability on stance leg than kicking leg. Increased complexity of strengthening exercises to promote improved balance and proprioception with good tolerance noted. Provided suggestions for continued self-progression of HEP. Pt reports she feels like she is progressing well but would like to continue for a few more visits before fully transitioning to the HEP.    Comorbidities Chronic B knee pain, chronic migraines    Rehab Potential Excellent    PT Frequency 1x / week   pt only able to come 1x/wk   PT Duration 8 weeks    PT Treatment/Interventions ADLs/Self Care Home Management;Cryotherapy;Electrical Stimulation;Iontophoresis 78m/ml Dexamethasone;Moist Heat;Ultrasound;Gait training;Stair training;Functional mobility training;Therapeutic activities;Therapeutic exercise;Balance training;Neuromuscular re-education;Patient/family education;Manual techniques;Passive range of motion;Dry needling;Taping;Vasopneumatic Device;Joint Manipulations    PT Next Visit Plan progress L ankle & proximal LE strengthening and proprioceptive training    Consulted and Agree with Plan of Care Patient           Patient will benefit from skilled therapeutic intervention in order to improve the following deficits and impairments:  Decreased activity tolerance,Decreased balance,Decreased endurance,Decreased mobility,Decreased range of motion,Decreased strength,Difficulty walking,Hypermobility,Increased fascial restricitons,Increased muscle spasms,Impaired perceived functional ability,Impaired flexibility,Improper body mechanics,Postural dysfunction,Pain  Visit Diagnosis: Pain in left ankle and joints of left foot  Muscle weakness (generalized)  Other symptoms and signs involving the musculoskeletal  system  Stiffness of left ankle, not elsewhere classified     Problem List Patient Active Problem List   Diagnosis Date Noted  . Fever 04/18/2020  . Headache 04/18/2020  . Vancomycin adverse reaction, initial encounter 04/16/2020  . Chronic migraine without aura without status migrainosus, not intractable 01/28/2020  . Chronic pain of both knees 01/28/2020  . Ankle ligament laxity, left 12/19/2019    JPercival Spanish PT, MPT 05/04/2020, 4:25 PM  [facility] [place] [address]Phone: [phone]  Fax:  [phone] Name: Kathryn LodatoMRN: [id number] of Birth: 212/23/2002

## 2020-05-08 ENCOUNTER — Telehealth: Payer: Self-pay | Admitting: Family Medicine

## 2020-05-08 ENCOUNTER — Ambulatory Visit
Admission: RE | Admit: 2020-05-08 | Discharge: 2020-05-08 | Disposition: A | Discharge status: Home / Self Care | Payer: Commercial Managed Care - PPO | Source: Ambulatory Visit | Attending: Family Medicine | Admitting: Family Medicine

## 2020-05-08 ENCOUNTER — Other Ambulatory Visit: Payer: Self-pay

## 2020-05-08 DIAGNOSIS — G4485 Primary stabbing headache: Secondary | ICD-10-CM

## 2020-05-08 NOTE — Telephone Encounter (Signed)
Brain MRI looks good.  No tumors, no vascular malformations seen.  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

## 2020-05-18 ENCOUNTER — Ambulatory Visit: Payer: Commercial Managed Care - PPO | Attending: Orthopaedic Surgery | Admitting: Physical Therapy

## 2020-05-18 ENCOUNTER — Encounter: Payer: Self-pay | Admitting: Physical Therapy

## 2020-05-18 ENCOUNTER — Other Ambulatory Visit: Payer: Self-pay

## 2020-05-18 DIAGNOSIS — R29898 Other symptoms and signs involving the musculoskeletal system: Secondary | ICD-10-CM | POA: Insufficient documentation

## 2020-05-18 DIAGNOSIS — M25572 Pain in left ankle and joints of left foot: Secondary | ICD-10-CM | POA: Insufficient documentation

## 2020-05-18 DIAGNOSIS — M6281 Muscle weakness (generalized): Secondary | ICD-10-CM | POA: Diagnosis present

## 2020-05-18 DIAGNOSIS — M25672 Stiffness of left ankle, not elsewhere classified: Secondary | ICD-10-CM | POA: Insufficient documentation

## 2020-05-18 NOTE — Therapy (Signed)
[facility] [place] [address]  [place], [place], [address] Phone: [phone]   Fax:  [phone]  Physical Therapy Treatment  Patient Details  Name: Kathryn Anderson MRN: [id number] Date of Birth: September 20, 2000 Referring Provider (PT): Joni Fears, MD   Encounter Date: 05/18/2020   PT End of Session - 05/18/20 1536    Visit Number 5    Number of Visits 8    Date for PT Re-Evaluation 05/25/20    Authorization Type [employer]    PT Start Time 1536    PT Stop Time 1616    PT Time Calculation (min) 40 min    Activity Tolerance Patient tolerated treatment well    Behavior During Therapy [facility] for tasks assessed/performed           History reviewed. No pertinent past medical history.  History reviewed. No pertinent surgical history.  There were no vitals filed for this visit.   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 She reports she was wearing her brace some of the time but not all of the time.    Patient Stated Goals "to keep from spraining her L ankle"    Currently in Pain? Yes    Pain Score 4    3.5-4/10   Pain Location Ankle    Pain Orientation Left    Pain Descriptors / Indicators Sore    Pain Type Acute pain    Pain Onset More than a month ago    Pain Frequency Intermittent                             [facility] Adult PT Treatment/Exercise - 05/18/20 1536      Exercises   Exercises Ankle      Manual Therapy   Manual Therapy Soft tissue mobilization;Myofascial release;Other (comment)    Manual therapy comments skilled palpation and monitoring during DN    Soft tissue mobilization STM to L peroneals    Myofascial Release manual TPR to L peroneus longus & brevis    Other Manual  Therapy Instructed pt in self-STM using rolling pin to L peroneals      Ankle Exercises: Stretches   Other Stretch L peroneal stretch standing at wall with folded towel under medial foot 2 x 30 sec      Ankle Exercises: Aerobic   Recumbent Bike L3 x 6 min            Trigger Point Dry Needling - 05/18/20 1536    Consent Given? Yes    Education Handout Provided Yes    Muscles Treated Lower Quadrant Peroneals   Lt   Peroneals Response Twitch response elicited;Palpable increased muscle length                     PT Long Term Goals - 05/18/20 1542      PT LONG TERM GOAL #1   Title Patient will be independent with ongoing/advanced HEP +/- gym program for self-management at home    Status Partially Met    Target Date 05/25/20      PT LONG TERM GOAL #2   Title Patient will demonstrate improved L ankle & proximal LE strength to >/=  5-/5 for improved stability and ease of mobility    Status On-going    Target Date 05/25/20      PT LONG TERM GOAL #3   Title Patient will report no limitation with floor transfers at work due L ankle laxity/weakness    Status Partially Met   05/18/20 - pt notes occasional very brief pain but no instability   Target Date 05/25/20      PT LONG TERM GOAL #4   Title Patient will report no further instances of L ankle instability/sprains while playing volleyball    Status On-going    Target Date 05/25/20                 Plan - 05/18/20 1616    Clinical Impression Statement Kathryn Anderson reports increased soreness after busy week 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 Soreness most prominent in L peroneals with taut bands and TPs noted which appeared amenable to DN. After explanation of DN rational, procedures, outcomes and potential side effects, patient verbalized consent to DN treatment in conjunction with manual STM/DTM and TPR to reduce ttp/muscle tension. Muscles treated include L peroneous longus & brevis. DN produced normal response with  good twitches elicited resulting in palpable reduction in pain/ttp and muscle tension. Pt educated to expect mild to moderate muscle soreness for up to 24-48 hrs and instructed to continue prescribed home exercise program and current activity level with pt verbalizing understanding of theses instructions. Given recent increase in soreness, may need to consider recert next visit pending long-term response to DN and ongoing pain soreness.    Comorbidities Chronic B knee pain, chronic migraines    Rehab Potential Excellent    PT Frequency 1x / week   pt only able to come 1x/wk   PT Duration 8 weeks    PT Treatment/Interventions ADLs/Self Care Home Management;Cryotherapy;Electrical Stimulation;Iontophoresis 8m/ml Dexamethasone;Moist Heat;Ultrasound;Gait training;Stair training;Functional mobility training;Therapeutic activities;Therapeutic exercise;Balance training;Neuromuscular re-education;Patient/family education;Manual techniques;Passive range of motion;Dry needling;Taping;Vasopneumatic Device;Joint Manipulations    PT Next Visit Plan assess response to DN; recert vs transition to HEP    Consulted and Agree with Plan of Care Patient           Patient will benefit from skilled therapeutic intervention in order to improve the following deficits and impairments:  Decreased activity tolerance,Decreased balance,Decreased endurance,Decreased mobility,Decreased range of motion,Decreased strength,Difficulty walking,Hypermobility,Increased fascial restricitons,Increased muscle spasms,Impaired perceived functional ability,Impaired flexibility,Improper body mechanics,Postural dysfunction,Pain  Visit Diagnosis: Pain in left ankle and joints of left foot  Muscle weakness (generalized)  Other symptoms and signs involving the musculoskeletal system  Stiffness of left ankle, not elsewhere classified     Problem List Patient Active Problem List   Diagnosis Date Noted  . Fever 04/18/2020  . Headache  04/18/2020  . Vancomycin adverse reaction, initial encounter 04/16/2020  . Chronic migraine without aura without status migrainosus, not intractable 01/28/2020  . Chronic pain of both knees 01/28/2020  . Ankle ligament laxity, left 12/19/2019    JPercival Spanish PT, MPT 05/18/2020, 4:30 PM  [facility] [address]Phone: [phone]  Fax:  [phone] Name: AKelse PlochMRN: [id number] of Birth: [birth year]

## 2020-05-25 ENCOUNTER — Ambulatory Visit: Payer: Commercial Managed Care - PPO | Admitting: Physical Therapy

## 2020-05-25 ENCOUNTER — Other Ambulatory Visit: Payer: Self-pay

## 2020-05-25 ENCOUNTER — Encounter: Payer: Self-pay | Admitting: Physical Therapy

## 2020-05-25 DIAGNOSIS — M25572 Pain in left ankle and joints of left foot: Secondary | ICD-10-CM

## 2020-05-25 DIAGNOSIS — M25672 Stiffness of left ankle, not elsewhere classified: Secondary | ICD-10-CM

## 2020-05-25 DIAGNOSIS — R29898 Other symptoms and signs involving the musculoskeletal system: Secondary | ICD-10-CM

## 2020-05-25 DIAGNOSIS — M6281 Muscle weakness (generalized): Secondary | ICD-10-CM

## 2020-05-25 NOTE — Patient Instructions (Signed)
   Access Code: [email or web address] URL: [email or web address] Date: 05/25/2020 Prepared by: Glenetta Hew  Exercises Side Plank with Clam and Resistance - 1 x daily - 7 x weekly - 2 sets - 10 reps - 3 sec hold Side Stepping with Resistance at Feet - 1 x daily - 7 x weekly - 2 sets - 10 reps

## 2020-05-25 NOTE — Therapy (Addendum)
[facility] [address]  [place], [place], [address] Phone: [phone]   Fax:  [phone]  Physical Therapy Treatment / Progress Note / Discharge Summary  Patient Details  Name: Kathryn Anderson MRN: [id number] Date of Birth: 07/16/2000 Referring Provider (PT): Joni Fears, MD   Encounter Date: 05/25/2020   PT End of Session - 05/25/20 1540    Visit Number 6    Number of Visits 8    Date for PT Re-Evaluation 05/25/20    Authorization Type [employer]    PT Start Time 1540    PT Stop Time 1620    PT Time Calculation (min) 40 min    Activity Tolerance Patient tolerated treatment well    Behavior During Therapy [facility] for tasks assessed/performed           History reviewed. No pertinent past medical history.  History reviewed. No pertinent surgical history.  There were no vitals filed for this visit.   Subjective Assessment - 05/25/20 1543    Subjective Pt reports pain from flare-up last week seems to be subsiding wit pain no longer interefering with her daily activities. Pt notes she thinks the DN helped the muscles relax some and denies any soreness following the DN.    Patient Stated Goals "to keep from spraining her L ankle"    Currently in Pain? No/denies    Pain Onset --              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    Referring Provider (PT) Joni Fears, MD    Onset Date/Surgical Date --   chronic     Observation/Other Assessments   Focus on Therapeutic Outcomes (FOTO)  Ankle - 84% (16% limitation)      AROM   Left Ankle Dorsiflexion 18    Left Ankle Plantar Flexion 62    Left Ankle Inversion 39    Left Ankle Eversion 16      Strength   Right Hip Flexion 5/5    Right Hip Extension 5/5    Right Hip External Rotation  5/5    Right Hip Internal Rotation 5/5    Right Hip ABduction 5/5     Right Hip ADduction 4+/5    Left Hip Flexion 5/5    Left Hip Extension 4+/5    Left Hip External Rotation 4+/5    Left Hip Internal Rotation 5/5    Left Hip ABduction 5/5    Left Hip ADduction 4+/5    Right Knee Flexion 5/5    Right Knee Extension 5/5    Left Knee Flexion --   5-/5   Left Knee Extension 5/5    Right Ankle Dorsiflexion 5/5    Right Ankle Plantar Flexion 5/5    Right Ankle Inversion 5/5    Right Ankle Eversion 5/5    Left Ankle Dorsiflexion 5/5    Left Ankle Plantar Flexion 5/5    Left Ankle Inversion 5/5    Left Ankle Eversion 5/5                         [facility] Adult PT Treatment/Exercise - 05/25/20 1540      Exercises   Exercises Ankle      Ankle Exercises: Aerobic   Recumbent Bike L3 x 6 min      Ankle  Exercises: Standing   Side Shuffle (Round Trip) B side shuffle with looped red TB at ankles 2 x 25 ft      Ankle Exercises: Sidelying   Other Sidelying Ankle Exercises Clam with side plank and red TB resistance                  PT Education - 05/25/20 1616    Education Details Final HEP review & update - Access Code: [email or web address]    Person(s) Educated Patient    Methods Explanation;Verbal cues;Handout    Comprehension Verbalized understanding;Verbal cues required;Returned demonstration               PT Long Term Goals - 05/25/20 1548      PT LONG TERM GOAL #1   Title Patient will be independent with ongoing/advanced HEP +/- gym program for self-management at home    Status Achieved   05/25/20     PT LONG TERM GOAL #2   Title Patient will demonstrate improved L ankle & proximal LE strength to >/= 5-/5 for improved stability and ease of mobility    Status Achieved   05/25/20     PT LONG TERM GOAL #3   Title Patient will report no limitation with floor transfers at work due L ankle laxity/weakness    Status Achieved   05/25/20     PT LONG TERM GOAL #4   Title Patient will report no further instances of L ankle  instability/sprains while playing volleyball    Status Achieved   05/25/20                Plan - 05/25/20 1545    Clinical Impression Statement Kathryn Anderson reports reduction in ankle pain and improved ankle stability. Overall LE strength now 4+/5 to 5/5 with B ankles 5/5. She is independent with an ongoing HEP to address her remaining areas of weakness as well as continued ankle proprioceptive training. She denies limitation with mobility or tasks at work related to her ankle and has been able to play volleyball with evidence of ankle instability. All goals met for this episode and Kathryn Anderson feels confident transitioning to the HEP at this time but would like to remain on hold for 30 days in the event that issues arise that would necessitate a return to PT.    Comorbidities Chronic B knee pain, chronic migraines    Rehab Potential Excellent    PT Frequency --    PT Duration --    PT Treatment/Interventions ADLs/Self Care Home Management;Cryotherapy;Electrical Stimulation;Iontophoresis 22m/ml Dexamethasone;Moist Heat;Ultrasound;Gait training;Stair training;Functional mobility training;Therapeutic activities;Therapeutic exercise;Balance training;Neuromuscular re-education;Patient/family education;Manual techniques;Passive range of motion;Dry needling;Taping;Vasopneumatic Device;Joint Manipulations    PT Next Visit Plan transition to HEP + 30-day hold    Consulted and Agree with Plan of Care Patient           Patient will benefit from skilled therapeutic intervention in order to improve the following deficits and impairments:  Decreased activity tolerance,Decreased balance,Decreased endurance,Decreased mobility,Decreased range of motion,Decreased strength,Difficulty walking,Hypermobility,Increased fascial restricitons,Increased muscle spasms,Impaired perceived functional ability,Impaired flexibility,Improper body mechanics,Postural dysfunction,Pain  Visit Diagnosis: Pain in left ankle and joints  of left foot  Muscle weakness (generalized)  Other symptoms and signs involving the musculoskeletal system  Stiffness of left ankle, not elsewhere classified     Problem List Patient Active Problem List   Diagnosis Date Noted  . Fever 04/18/2020  . Headache 04/18/2020  . Vancomycin adverse reaction, initial encounter 04/16/2020  . Chronic migraine without aura without status migrainosus,  not intractable 01/28/2020  . Chronic pain of both knees 01/28/2020  . Ankle ligament laxity, left 12/19/2019    Percival Spanish, PT, MPT 05/25/2020, 6:28 PM  [facility] [address]  [place], [place], [address] Phone: [phone]   Fax:  [phone]  Name: Kathryn Anderson MRN: [id number] Date of Birth: 2000/06/03   PHYSICAL THERAPY DISCHARGE SUMMARY  Visits from Start of Care: 6  Current functional level related to goals / functional outcomes:   Refer to above clinical impression for status as of last visit on 05/25/2020. Patient was placed on hold for 30 days and has not needed to return to PT, therefore will proceed with discharge from PT for this episode.   Remaining deficits:   As above.   Education / Equipment:   HEP  Plan: Patient agrees to discharge.  Patient goals were met. Patient is being discharged due to meeting the stated rehab goals.  ?????     Percival Spanish, PT, MPT 07/23/20, 1:42 PM  [facility] [place], [place], [address] Phone: [phone]   Fax:  [phone]

## 2020-05-28 ENCOUNTER — Encounter: Payer: Self-pay | Admitting: Family Medicine

## 2020-05-28 MED ORDER — MONTELUKAST SODIUM 10 MG PO TABS: 5.0000 mg | ORAL_TABLET | Freq: Every evening | ORAL | 6 refills | Status: AC | PRN

## 2020-06-23 ENCOUNTER — Encounter: Payer: Self-pay | Admitting: Family Medicine

## 2020-06-23 DIAGNOSIS — R42 Dizziness and giddiness: Secondary | ICD-10-CM

## 2020-06-23 DIAGNOSIS — R519 Headache, unspecified: Secondary | ICD-10-CM

## 2020-07-24 ENCOUNTER — Encounter: Payer: Self-pay | Admitting: Family Medicine

## 2020-07-24 ENCOUNTER — Ambulatory Visit (INDEPENDENT_AMBULATORY_CARE_PROVIDER_SITE_OTHER): Payer: Commercial Managed Care - PPO | Admitting: Family Medicine

## 2020-07-24 ENCOUNTER — Other Ambulatory Visit: Payer: Self-pay

## 2020-07-24 DIAGNOSIS — M25561 Pain in right knee: Secondary | ICD-10-CM | POA: Diagnosis not present

## 2020-07-24 DIAGNOSIS — M25562 Pain in left knee: Secondary | ICD-10-CM

## 2020-07-24 DIAGNOSIS — G8929 Other chronic pain: Secondary | ICD-10-CM

## 2020-07-24 MED ORDER — MELOXICAM 15 MG PO TABS: 7.5000 mg | ORAL_TABLET | Freq: Every day | ORAL | 6 refills | Status: DC | PRN

## 2020-07-24 NOTE — Progress Notes (Signed)
   Office Visit Note   Patient: Kathryn Anderson           Date of Birth: June 04, 2000           MRN: [id number] Visit Date: 07/24/2020 Requested by: Lavada Mesi, MD [address] [place],  [place] [address] PCP: Lavada Mesi, MD  Subjective: Chief Complaint  Patient presents with  . Left Knee - Pain    Left knee pain, inferior to patella and some to the medial aspect. Started after her patella slipped out of place and then back in again, when she landed after jumping up in the air while playing volleyball. This was on 07/09/20. Currently wearing a removable knee wrap on that knee --- usually wears it on the right knee, due to pain in the same region as the left.    HPI: She is here with left knee pain.  Her knees were doing pretty well until recently, she was playing volleyball and she jumped and when she landed she felt like her knee popped out of place.  It quickly popped back into place but has been hurting on the anteromedial aspect since then.  Ibuprofen does not seem to be helping.  Her friend is with her from [place] today.              ROS:   All other systems were reviewed and are negative.  Objective: Vital Signs: There were no vitals taken for this visit.  Physical Exam:  General:  Alert and oriented, in no acute distress. Pulm:  Breathing unlabored. Psy:  Normal mood, congruent affect. Skin: No bruising Left knee: No effusion, she has hypermobile patella.  She has negative lateral apprehension test but pain with medial apprehension.  Lachman's feels solid.  She is tender on the medial joint line and has pain but no palpable click with McMurray's.   Imaging: No results found.  Assessment & Plan: 1.  Acute on chronic left knee pain, concerning for medial meniscus injury -We discussed options, she would rather try physical therapy.  Meloxicam as needed.  If she fails to improve, then x-rays and MRI scan.  2.  Incidentally regarding her headaches and dizziness, she plans  to consult with a functional neurologist hopefully in the near future.  She is getting some labs drawn soon as well.     Procedures: No procedures performed        PMFS History: Patient Active Problem List   Diagnosis Date Noted  . Fever 04/18/2020  . Headache 04/18/2020  . Vancomycin adverse reaction, initial encounter 04/16/2020  . Chronic migraine without aura without status migrainosus, not intractable 01/28/2020  . Chronic pain of both knees 01/28/2020  . Ankle ligament laxity, left 12/19/2019   History reviewed. No pertinent past medical history.  Family History  Problem Relation Age of Onset  . Healthy Mother   . Healthy Father   . Macular degeneration Maternal Grandmother   . Glaucoma Maternal Grandmother   . Dementia Paternal Grandfather   . Heart disease Neg Hx     History reviewed. No pertinent surgical history. Social History   Occupational History  . Not on file  Tobacco Use  . Smoking status: Never Smoker  . Smokeless tobacco: Never Used  Substance and Sexual Activity  . Alcohol use: Never  . Drug use: Never  . Sexual activity: Not on file

## 2020-08-17 ENCOUNTER — Ambulatory Visit: Payer: Commercial Managed Care - PPO | Admitting: Physical Therapy

## 2020-08-21 ENCOUNTER — Other Ambulatory Visit: Payer: Self-pay

## 2020-08-21 ENCOUNTER — Ambulatory Visit: Payer: Commercial Managed Care - PPO | Attending: Family Medicine | Admitting: Physical Therapy

## 2020-08-21 ENCOUNTER — Encounter: Payer: Self-pay | Admitting: Physical Therapy

## 2020-08-21 DIAGNOSIS — M25561 Pain in right knee: Secondary | ICD-10-CM | POA: Insufficient documentation

## 2020-08-21 DIAGNOSIS — R2689 Other abnormalities of gait and mobility: Secondary | ICD-10-CM | POA: Diagnosis present

## 2020-08-21 DIAGNOSIS — M6281 Muscle weakness (generalized): Secondary | ICD-10-CM | POA: Diagnosis present

## 2020-08-21 DIAGNOSIS — R29898 Other symptoms and signs involving the musculoskeletal system: Secondary | ICD-10-CM

## 2020-08-21 DIAGNOSIS — M25662 Stiffness of left knee, not elsewhere classified: Secondary | ICD-10-CM | POA: Insufficient documentation

## 2020-08-21 DIAGNOSIS — M25562 Pain in left knee: Secondary | ICD-10-CM | POA: Insufficient documentation

## 2020-08-21 DIAGNOSIS — R600 Localized edema: Secondary | ICD-10-CM | POA: Insufficient documentation

## 2020-08-21 DIAGNOSIS — G8929 Other chronic pain: Secondary | ICD-10-CM | POA: Diagnosis present

## 2020-08-21 NOTE — Therapy (Signed)
[facility] [address]  [address] [place], [place], [address] Phone: [phone]   Fax:  [phone]  Physical Therapy Evaluation  Patient Details  Name: Kathryn Anderson MRN: [id number] Date of Birth: 2000/11/14 Referring Provider (PT): Lavada Mesi, MD   Encounter Date: 08/21/2020   PT End of Session - 08/21/20 0803     Visit Number 1    Number of Visits 16    Date for PT Re-Evaluation 10/16/20    Authorization Type [employer]    PT Start Time 0803    PT Stop Time 0849    PT Time Calculation (min) 46 min    Activity Tolerance Patient tolerated treatment well    Behavior During Therapy [facility] for tasks assessed/performed             History reviewed. No pertinent past medical history.  History reviewed. No pertinent surgical history.  There were no vitals filed for this visit.    Subjective Assessment - 08/21/20 0805     Subjective Pt reports she was playing volleyball a couple weeks ago - she jumped and landed and felt something pop out in her L knee with significant pain noted. This happened again last Sunday while just playing around. Knee has felt stiff since. Notes feeling that her will sometimes pop similarly just when she steps wrong. Cannot currently play volleyball due to pain. Able to swim but no breast stroke. R knee also painful at times but no incidents like with L knee.    Limitations Standing;Walking    How long can you stand comfortably? 10-15 minutes    How long can you walk comfortably? low level pain usually but does not stop her    Patient Stated Goals "to get back to playing volleyball and be able to swim again"    Currently in Pain? Yes    Pain Score 0-No pain   4-5/10 with everyday activity/walking   Pain Location Knee    Pain Orientation Left;Anterior;Posterior    Pain Descriptors / Indicators Sharp;Tightness   "really really stiff"   Pain Type Acute pain    Pain Radiating Towards n/a     Pain Onset 1 to 4 weeks ago   ~3 weeks   Aggravating Factors  floor transfers, kneeling/quadruped, walking    Pain Relieving Factors rest, ice at night, ibuprofen (tries to minimize use)    Effect of Pain on Daily Activities unable to play volleyball, avoids lateral motion - i.e. unable to swim breast stroke, unable to sleep with knee fully extended                [facility] PT Assessment - 08/21/20 0803       Assessment   Medical Diagnosis Chronic pain of B knees    Referring Provider (PT) Lavada Mesi, MD    Onset Date/Surgical Date --   ~3 weeks   Hand Dominance Right    Next MD Visit none scheduled    Prior Therapy PT for L ankle instabiliyty earlier this year      Precautions   Precautions None      Restrictions   Weight Bearing Restrictions No      Balance Screen   Has the patient fallen in the past 6 months Yes    How many times? 2 - at times of knee popping    Has the patient had a decrease in activity level because of a fear of falling?  No  Is the patient reluctant to leave their home because of a fear of falling?  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    Leisure volleyball - 3x/wk; swim; gym 3-4x/wk (cardio & [employer]yer[employer] Companyweights); play violin      Observation/Other Assessments   Focus on Therapeutic Outcomes (FOTO)  Knee = 49; predicted D/C FS = 75      AROM   AROM Assessment Site Knee    Right Knee Extension -1   in LAQ; -9 when supported at ankle   Right Knee Flexion 142    Left Knee Extension 8   in LAQ; 0 when supported at ankle   Left Knee Flexion 120      Strength   Right Hip Flexion 4+/5    Right Hip  Extension 5/5    Right Hip External Rotation  4+/5    Right Hip Internal Rotation 5/5    Right Hip ABduction 5/5    Right Hip ADduction 4+/5    Left Hip Flexion 4/5    Left Hip Extension 4+/5    Left Hip External Rotation 3/5   painful at knee   Left Hip Internal Rotation 3+/5   painful at knee   Left Hip ABduction 4+/5    Left Hip ADduction 4/5    Right Knee Flexion 5/5    Right Knee Extension 4+/5   pain at patellar tendon   Left Knee Flexion 4-/5   pain in medial/alteral knee   Left Knee Extension 4-/5   pain at patellar tendon & medial/lateral patella   Right Ankle Dorsiflexion 5/5    Right Ankle Plantar Flexion 5/5    Right Ankle Inversion 5/5    Right Ankle Eversion 5/5    Left Ankle Dorsiflexion 5/5    Left Ankle Plantar Flexion 3/5   painful at knee   Left Ankle Inversion 4-/5   painful at medial knee   Left Ankle Eversion 4/5      Flexibility   Soft Tissue Assessment /Muscle Length yes    Hamstrings mild/mod tight B    Quadriceps mild tight    ITB mild tight L>R    Piriformis WFL      Palpation   Patella mobility L patella mildly restricted medially & sup/inf    Palpation comment minimal to no VMO activation on L with QS, delayed VMO activation on R with QS      Special Tests    Special Tests Knee Special Tests;Hip Special Tests    Hip Special Tests  Ober's Test    Knee Special tests  Patellofemoral Grind Test (Clarke's Sign)      Ober's Test   Findings Positive    Side Left;Right   L>R     Patellofemoral Grind test (Clark's Sign)   Findings Negative      Ambulation/Gait   Ambulation/Gait Yes    Ambulation/Gait Assistance 7: Independent    Assistive device None    Gait Pattern Step-through pattern;Antalgic;Decreased weight shift to left;Decreased stance time - left;Left flexed knee in stance;Decreased stride  length                        Objective measurements completed on examination: See above findings.       [facility] Adult PT  Treatment/Exercise - 08/21/20 0803       Exercises   Exercises Knee/Hip      Knee/Hip Exercises: Stretches   Passive Hamstring Stretch Left;1 rep;30 seconds   Pt to perform B at home   Passive Hamstring Stretch Limitations supine with strap    ITB Stretch Left;1 rep;30 seconds   Pt to perform B at home   ITB Stretch Limitations supine cross-body with strap      Knee/Hip Exercises: Supine   Quad Sets Both;5 reps;Strengthening    Quad Sets Limitations towel roll under knee + hip ADD ball squeeze to increase VMO activation                    PT Education - 08/21/20 0848     Education Details PT eval findings, anticipated POC & initial HEP - Access Code: [facility]    Person(s) Educated Patient    Methods Explanation;Demonstration;Verbal cues;Handout    Comprehension Verbalized understanding;Verbal cues required;Returned demonstration;Need further instruction              PT Short Term Goals - 08/21/20 0849       PT SHORT TERM GOAL #1   Title Patient will be independent with initial HEP    Status New    Target Date 09/18/20      PT SHORT TERM GOAL #2   Title Patient will be able to elicit good VMO activation with quad set on L knee    Status New    Target Date 09/18/20               PT Long Term Goals - 08/21/20 0849       PT LONG TERM GOAL #1   Title Patient will be independent with ongoing/advanced HEP +/- gym program for self-management at home    Status New    Target Date 10/16/20      PT LONG TERM GOAL #2   Title Patient to improve L knee AROM to WNL without pain provocation    Status New    Target Date 10/16/20      PT LONG TERM GOAL #3   Title Patient will demonstrate improved B LE strength to >/= 5-/5 with good VMO activation/control for improved stability and ease of mobility    Status New    Target Date 10/16/20      PT LONG TERM GOAL #4   Title Patient will report no limitation with floor transfers at work due L knee pain or  instability    Status New    Target Date 10/16/20      PT LONG TERM GOAL #5   Title Patient will report no further instances of L patellar subluxation during activity or while playing volleyball    Status New    Target Date 10/16/20                    Plan - 08/21/20 1134     Clinical Impression Statement Kathryn Anderson is a 20 y/o female who presents to OP PT for acute on chronic B knee pain, L>R. She reports exacerbation of her pain in her L knee while playing volleyball ~3 weeks ago when she went up for a jump and felt something "  pop out" in her knee upon landing with sharp pain noted at the time. A 2nd occurrence happened this past Sunday, again associated with a sharp pain. Her L knee remains stiff and swollen with pain increasing during transitional movements sit <> stand or floor transfers as well as with standing and walking - she is currently unable to play volleyball or swim breaststroke due to the L knee pain and she notes she feels that her kneecap wants to pop out at times with these types of activity. Current deficits include mild/mod proximal LE tightness L>R and mild to mod B LE weakness with VMO activation nearly absent on L and decreased/delayed on R and laterally tracking patella, likely resulting L patellar subluxation. Kathryn Anderson will benefit from skilled PT to address above deficits to improve muscle tension, flexibility, patellar tracking and strength in B LE to allow resumption of normal daily activity and desired sports without pain interference.    Personal Factors and Comorbidities Time since onset of injury/illness/exacerbation;Comorbidity 2;Past/Current Experience    Comorbidities Chronic B knee pain, recent PT for L ankle instability, chronic migraines    Examination-Activity Limitations Bend;Locomotion Level;Sleep;Squat;Stand;Transfers    Examination-Participation Restrictions Community Activity;Occupation;Other   Sports - volleyball & swimming   Stability/Clinical  Decision Making Stable/Uncomplicated    Clinical Decision Making Low    Rehab Potential Good    PT Frequency 2x / week    PT Duration 8 weeks   6-8 weeks   PT Treatment/Interventions ADLs/Self Care Home Management;Cryotherapy;Electrical Stimulation;Iontophoresis 4mg /ml Dexamethasone;Moist Heat;Ultrasound;Gait training;Stair training;Functional mobility training;Therapeutic activities;Therapeutic exercise;Balance training;Neuromuscular re-education;Patient/family education;Manual techniques;Passive range of motion;Dry needling;Taping;Vasopneumatic Device;Joint Manipulations    PT Next Visit Plan Review initial HEP; gentle L knee ROM and stretching; LE strengthening - emphasis on VMO activation & proximal stability; manual therapy and modalities PRN for pain/edema    PT Home Exercise Plan Access Code: [email or web address] (6/17)    Consulted and Agree with Plan of Care Patient             Patient will benefit from skilled therapeutic intervention in order to improve the following deficits and impairments:  Abnormal gait, Decreased activity tolerance, Decreased balance, Decreased endurance, Decreased mobility, Decreased range of motion, Decreased strength, Difficulty walking, Hypermobility, Increased fascial restricitons, Increased muscle spasms, Impaired perceived functional ability, Impaired flexibility, Improper body mechanics, Postural dysfunction, Pain  Visit Diagnosis: Acute pain of left knee  Stiffness of left knee, not elsewhere classified  Chronic pain of right knee  Muscle weakness (generalized)  Other symptoms and signs involving the musculoskeletal system  Other abnormalities of gait and mobility  Localized edema     Problem List Patient Active Problem List   Diagnosis Date Noted   Fever 04/18/2020   Headache 04/18/2020   Vancomycin adverse reaction, initial encounter 04/16/2020   Chronic migraine without aura without status migrainosus, not intractable 01/28/2020   Chronic  pain of both knees 01/28/2020   Ankle ligament laxity, left 12/19/2019    12/21/2019, PT, MPT 08/21/2020, 12:03 PM  [facility] [address]  [address] [place], [place], [place] Phone: [phone]   Fax:  [phone]  Name: Kathryn Anderson MRN: Erma Pinto Date of Birth: May 08, 2000

## 2020-08-21 NOTE — Patient Instructions (Signed)
    Access Code: [facility] URL: [email or web address] Date: 08/21/2020 Prepared by: Glenetta Hew  Exercises Hooklying Hamstring Stretch with Strap - 2-3 x daily - 7 x weekly - 3 reps - 30 sec hold Supine ITB Stretch with Strap - 2-3 x daily - 7 x weekly - 3 reps - 30 sec hold Supine Straight Leg Hip Adduction and Quad Set with Ball - 3 x daily - 7 x weekly - 2 sets - 10 reps - 5 sec hold

## 2020-09-08 ENCOUNTER — Encounter: Payer: Self-pay | Admitting: Family Medicine

## 2020-09-08 ENCOUNTER — Other Ambulatory Visit: Payer: Self-pay | Admitting: Family Medicine

## 2020-09-08 MED ORDER — TRIAMCINOLONE ACETONIDE 0.1 % EX CREA: 1.0000 "application " | TOPICAL_CREAM | Freq: Two times a day (BID) | CUTANEOUS | 3 refills | Status: DC | PRN

## 2020-09-08 NOTE — Progress Notes (Signed)
Rx sent 

## 2020-09-10 ENCOUNTER — Encounter: Payer: Self-pay | Admitting: Physical Therapy

## 2020-09-10 ENCOUNTER — Other Ambulatory Visit: Payer: Self-pay

## 2020-09-10 ENCOUNTER — Ambulatory Visit: Payer: Commercial Managed Care - PPO | Attending: Family Medicine | Admitting: Physical Therapy

## 2020-09-10 DIAGNOSIS — M25562 Pain in left knee: Secondary | ICD-10-CM | POA: Diagnosis present

## 2020-09-10 DIAGNOSIS — R29898 Other symptoms and signs involving the musculoskeletal system: Secondary | ICD-10-CM | POA: Insufficient documentation

## 2020-09-10 DIAGNOSIS — G8929 Other chronic pain: Secondary | ICD-10-CM | POA: Insufficient documentation

## 2020-09-10 DIAGNOSIS — M25561 Pain in right knee: Secondary | ICD-10-CM | POA: Insufficient documentation

## 2020-09-10 DIAGNOSIS — R2689 Other abnormalities of gait and mobility: Secondary | ICD-10-CM | POA: Diagnosis present

## 2020-09-10 DIAGNOSIS — R600 Localized edema: Secondary | ICD-10-CM | POA: Insufficient documentation

## 2020-09-10 DIAGNOSIS — M25662 Stiffness of left knee, not elsewhere classified: Secondary | ICD-10-CM | POA: Insufficient documentation

## 2020-09-10 DIAGNOSIS — M6281 Muscle weakness (generalized): Secondary | ICD-10-CM | POA: Diagnosis present

## 2020-09-10 NOTE — Patient Instructions (Signed)
      Access Code: [facility] URL: [email or web address] Date: 09/10/2020 Prepared by: Glenetta Hew  Exercises Hooklying Hamstring Stretch with Strap - 2-3 x daily - 7 x weekly - 3 reps - 30 sec hold Supine ITB Stretch with Strap - 2-3 x daily - 7 x weekly - 3 reps - 30 sec hold Supine Straight Leg Hip Adduction and Quad Set with Ball - 3 x daily - 7 x weekly - 2 sets - 10 reps - 5 sec hold Seated Hamstring Stretch - 2-3 x daily - 7 x weekly - 3 reps - 30 sec hold Straight Leg Raise with External Rotation - 1 x daily - 7 x weekly - 1-2 sets - 10 reps - 3 sec hold Clamshell with Resistance - 1 x daily - 7 x weekly - 2 sets - 10 reps - 3-5 sec hold Sidelying Reverse Clamshell with Resistance - 1 x daily - 7 x weekly - 2 sets - 10 reps - 3-5 sec hold

## 2020-09-10 NOTE — Therapy (Signed)
[facility] [address]  [address] [place], [place], [address] Phone: [phone]   Fax:  [phone]  Physical Therapy Treatment  Patient Details  Name: Ricarda Atayde MRN: [id number] Date of Birth: 03/10/2000 Referring Provider (PT): Lavada Mesi, MD   Encounter Date: 09/10/2020   PT End of Session - 09/10/20 1446     Visit Number 2    Number of Visits 16    Date for PT Re-Evaluation 10/16/20    Authorization Type [employer]    PT Start Time 1446    PT Stop Time 1526    PT Time Calculation (min) 40 min    Activity Tolerance Patient tolerated treatment well    Behavior During Therapy [facility] for tasks assessed/performed             History reviewed. No pertinent past medical history.  History reviewed. No pertinent surgical history.  There were no vitals filed for this visit.   Subjective Assessment - 09/10/20 1450     Subjective Pt reports her knee has been more sore for the last couple of days - not sure why but was on the floor more for work and may have twisted it or something.    Patient Stated Goals Pt re    Currently in Pain? Yes    Pain Score 5    4-5/10   Pain Location Knee    Pain Orientation Left    Pain Descriptors / Indicators Aching   "pulsing"   Pain Type Acute pain                               [facility] Adult PT Treatment/Exercise - 09/10/20 1446       Exercises   Exercises Knee/Hip      Knee/Hip Exercises: Stretches   Passive Hamstring Stretch Left;Right;2 reps;30 seconds    Passive Hamstring Stretch Limitations seated hip hinge & supine with strap    ITB Stretch Left;Right;3 reps;30 seconds    ITB Stretch Limitations standing lateral lean (deferred d/t increased knee pain), supine cross-body with strap & sidelying - pt noting best stretch with strap      Knee/Hip Exercises: Aerobic   Recumbent Bike L3 x 6 min      Knee/Hip Exercises: Supine   Quad Sets Both;10  reps;Strengthening    Quad Sets Limitations 6" FR + hip ADD ball squeeze to increase VMO activation    Short Arc Quad Sets Both;10 reps;2 sets;Strengthening    Short Arc Quad Sets Limitations hooklying + hip ADD ball squeeze   2nd set with 2# ankle wts   Henreitta Leber with [employer] Squeeze Both;10 reps;Strengthening    Straight Leg Raise with External Rotation Right;Left;10 reps;Strengthening    Straight Leg Raise with External Rotation Limitations 2#      Knee/Hip Exercises: Sidelying   Hip ADduction Right;Left;10 reps;Strengthening    Hip ADduction Limitations 2#    Clams R/L red TB clam 2  x 10    Other Sidelying Knee/Hip Exercises R/L reverse clam with 2# at ankle and ball btw knees x 10                    PT Education - 09/10/20 1528     Education Details HEP update - VMO and proximal LE strengthening - Access Code: [employer]) Educated Patient    Methods Explanation;Demonstration;Verbal cues;Handout  Comprehension Verbalized understanding;Verbal cues required;Returned demonstration;Need further instruction              PT Short Term Goals - 09/10/20 1453       PT SHORT TERM GOAL #1   Title Patient will be independent with initial HEP    Status On-going    Target Date 09/18/20      PT SHORT TERM GOAL #2   Title Patient will be able to elicit good VMO activation with quad set on L knee    Status On-going    Target Date 09/18/20               PT Long Term Goals - 09/10/20 1453       PT LONG TERM GOAL #1   Title Patient will be independent with ongoing/advanced HEP +/- gym program for self-management at home    Status On-going    Target Date 10/16/20      PT LONG TERM GOAL #2   Title Patient to improve L knee AROM to WNL without pain provocation    Status On-going    Target Date 10/16/20      PT LONG TERM GOAL #3   Title Patient will demonstrate improved B LE strength to >/= 5-/5 with good VMO activation/control for improved stability and  ease of mobility    Status On-going    Target Date 10/16/20      PT LONG TERM GOAL #4   Title Patient will report no limitation with floor transfers at work due L knee pain or instability    Status On-going    Target Date 10/16/20      PT LONG TERM GOAL #5   Title Patient will report no further instances of L patellar subluxation during activity or while playing volleyball    Status On-going    Target Date 10/16/20                   Plan - 09/10/20 1454     Clinical Impression Statement Robbi reports no issues with the HEP but feels like she is not progressing much with the stretches - still feels pretty tight. HEP reviewed with alternative version of stretches introduced but pt noting best stretches for ITB with strap. Progressed LE strengthening targeting proximal LE weakness as well as continuing emphasis on VMO activation with HEP updated accordingly. Majority of exercises complete in supine due to lack of supportive footwear - pt encouraged to wear athletic shoes to next visit.    Personal Factors and Comorbidities Time since onset of injury/illness/exacerbation;Comorbidity 2;Past/Current Experience    Comorbidities Chronic B knee pain, recent PT for L ankle instability, chronic migraines    Examination-Activity Limitations Bend;Locomotion Level;Sleep;Squat;Stand;Transfers    Examination-Participation Restrictions Community Activity;Occupation;Other   Sports - volleyball & swimming   Rehab Potential Good    PT Frequency 2x / week    PT Duration 8 weeks   6-8 weeks   PT Treatment/Interventions ADLs/Self Care Home Management;Cryotherapy;Electrical Stimulation;Iontophoresis 4mg /ml Dexamethasone;Moist Heat;Ultrasound;Gait training;Stair training;Functional mobility training;Therapeutic activities;Therapeutic exercise;Balance training;Neuromuscular re-education;Patient/family education;Manual techniques;Passive range of motion;Dry needling;Taping;Vasopneumatic Device;Joint  Manipulations    PT Next Visit Plan gentle L knee ROM and stretching; LE strengthening - emphasis on VMO activation & proximal stability; HEP update PRN; manual therapy and modalities PRN for pain/edema    PT Home Exercise Plan Access Code: [email or web address] (6/17, updated 7/7)    Consulted and Agree with Plan of Care Patient  Patient will benefit from skilled therapeutic intervention in order to improve the following deficits and impairments:  Abnormal gait, Decreased activity tolerance, Decreased balance, Decreased endurance, Decreased mobility, Decreased range of motion, Decreased strength, Difficulty walking, Hypermobility, Increased fascial restricitons, Increased muscle spasms, Impaired perceived functional ability, Impaired flexibility, Improper body mechanics, Postural dysfunction, Pain  Visit Diagnosis: Acute pain of left knee  Stiffness of left knee, not elsewhere classified  Chronic pain of right knee  Muscle weakness (generalized)  Other symptoms and signs involving the musculoskeletal system  Other abnormalities of gait and mobility  Localized edema     Problem List Patient Active Problem List   Diagnosis Date Noted   Fever 04/18/2020   Headache 04/18/2020   Vancomycin adverse reaction, initial encounter 04/16/2020   Chronic migraine without aura without status migrainosus, not intractable 01/28/2020   Chronic pain of both knees 01/28/2020   Ankle ligament laxity, left 12/19/2019    Marry Guan, PT, MPT 09/10/2020, 8:20 PM  [facility] [address]  [address] [place], [place], [address] Phone: [phone]   Fax:  [phone]  Name: Josanne Boerema MRN: [id number] Date of Birth: May 05, 2000

## 2020-09-15 ENCOUNTER — Encounter: Payer: Self-pay | Admitting: Family Medicine

## 2020-09-15 ENCOUNTER — Ambulatory Visit (INDEPENDENT_AMBULATORY_CARE_PROVIDER_SITE_OTHER): Payer: Commercial Managed Care - PPO | Admitting: Family Medicine

## 2020-09-15 ENCOUNTER — Other Ambulatory Visit: Payer: Self-pay

## 2020-09-15 VITALS — BP 107/67 | HR 89 | Temp 98.2°F | Ht 60.0 in | Wt 158.8 lb

## 2020-09-15 DIAGNOSIS — R059 Cough, unspecified: Secondary | ICD-10-CM | POA: Diagnosis not present

## 2020-09-15 MED ORDER — BENZONATATE 100 MG PO CAPS: 100.0000 mg | ORAL_CAPSULE | Freq: Three times a day (TID) | ORAL | 1 refills | Status: DC | PRN

## 2020-09-15 MED ORDER — AZITHROMYCIN 250 MG PO TABS: ORAL_TABLET | ORAL | 0 refills | Status: DC

## 2020-09-15 NOTE — Progress Notes (Signed)
   Office Visit Note   Patient: Kathryn Anderson           Date of Birth: 2000/06/12           MRN: [id number] Visit Date: 09/15/2020 Requested by: Lavada Mesi, MD [address] [place],  [place] [address] PCP: Lavada Mesi, MD  Subjective: Chief Complaint  Patient presents with   Cough    Persistent cough - this is week 5. A bit productive 1st am but nonproductive rest of day. Wakes up during the night coughing. Has had no fever. Has had intermittent sinus congestion/"cold symptoms."    HPI: She is here with a cough.  Symptoms started about 5 weeks ago.  First she had a mild sore throat with head congestion, which turned into a hacking cough.  The more severe symptoms only lasted 2 days, but the cough has persisted.  She has mild head congestion with clear drainage.  No shortness of breath or chest pain.  No history of asthma.  She generally has not had troubles with allergies, but she has noticed more troubles since moving to [place].               ROS:   All other systems were reviewed and are negative.  Objective: Vital Signs: BP 107/67 (BP Location: Right Arm, Patient Position: Sitting, Cuff Size: Normal)   Pulse 89   Temp 98.2 F (36.8 C)   Ht 5' (1.524 m)   Wt 158 lb 12.8 oz (72 kg)   SpO2 98%   BMI 31.01 kg/m   Physical Exam:  General:  Alert and oriented, in no acute distress. Pulm:  Breathing unlabored. Psy:  Normal mood, congruent affect.  HEENT: No conjunctival erythema, tympanic membranes are clear.  Nasal passages are congested.  Oropharynx is clear.  Neck has no lymphadenopathy. CV: Regular rate and rhythm without murmurs, rubs, or gallops.  No peripheral edema.  2+ radial and posterior tibial pulses. Lungs: Clear to auscultation throughout with no wheezing or areas of consolidation.    Imaging: No results found.  Assessment & Plan: Cough, etiology uncertain.  Could be sinus infection with postnasal drainage versus seasonal allergies. -She will  try Zyrtec over-the-counter.  Zithromax called in as well.  Tessalon Perles as needed.     Procedures: No procedures performed        PMFS History: Patient Active Problem List   Diagnosis Date Noted   Fever 04/18/2020   Headache 04/18/2020   Vancomycin adverse reaction, initial encounter 04/16/2020   Chronic migraine without aura without status migrainosus, not intractable 01/28/2020   Chronic pain of both knees 01/28/2020   Ankle ligament laxity, left 12/19/2019   History reviewed. No pertinent past medical history.  Family History  Problem Relation Age of Onset   Healthy Mother    Healthy Father    Macular degeneration Maternal Grandmother    Glaucoma Maternal Grandmother    Dementia Paternal Grandfather    Heart disease Neg Hx     History reviewed. No pertinent surgical history. Social History   Occupational History   Not on file  Tobacco Use   Smoking status: Never   Smokeless tobacco: Never  Substance and Sexual Activity   Alcohol use: Never   Drug use: Never   Sexual activity: Not on file

## 2020-09-17 ENCOUNTER — Encounter: Payer: Commercial Managed Care - PPO | Admitting: Physical Therapy

## 2020-09-21 ENCOUNTER — Ambulatory Visit: Payer: Commercial Managed Care - PPO

## 2020-09-21 ENCOUNTER — Encounter: Payer: Self-pay | Admitting: Family Medicine

## 2020-09-21 ENCOUNTER — Other Ambulatory Visit: Payer: Self-pay

## 2020-09-21 DIAGNOSIS — R29898 Other symptoms and signs involving the musculoskeletal system: Secondary | ICD-10-CM

## 2020-09-21 DIAGNOSIS — M25561 Pain in right knee: Secondary | ICD-10-CM

## 2020-09-21 DIAGNOSIS — M25662 Stiffness of left knee, not elsewhere classified: Secondary | ICD-10-CM

## 2020-09-21 DIAGNOSIS — M25562 Pain in left knee: Secondary | ICD-10-CM

## 2020-09-21 DIAGNOSIS — G8929 Other chronic pain: Secondary | ICD-10-CM

## 2020-09-21 DIAGNOSIS — R2689 Other abnormalities of gait and mobility: Secondary | ICD-10-CM

## 2020-09-21 DIAGNOSIS — R600 Localized edema: Secondary | ICD-10-CM

## 2020-09-21 DIAGNOSIS — M6281 Muscle weakness (generalized): Secondary | ICD-10-CM

## 2020-09-21 NOTE — Therapy (Addendum)
[facility] [address]  [place], [place], [address] Phone: [phone]   Fax:  [phone]  Physical Therapy Treatment / Discharge Summary  Patient Details  Name: Kathryn Anderson MRN: [id number] Date of Birth: 06-Jul-2000 Referring Provider (PT): Eunice Blase, MD   Encounter Date: 09/21/2020   PT End of Session - 09/21/20 1533     Visit Number 3    Number of Visits 16    Date for PT Re-Evaluation 10/16/20    Authorization Type [place]    PT Start Time 0940   pt late   PT Stop Time 1528    PT Time Calculation (min) 37 min    Activity Tolerance Patient tolerated treatment well    Behavior During Therapy [facility] for tasks assessed/performed             History reviewed. No pertinent past medical history.  History reviewed. No pertinent surgical history.  There were no vitals filed for this visit.   Subjective Assessment - 09/21/20 1456     Subjective Pt reports that she was playing volleyball last week and she had another patellar dislocation episode.    Currently in Pain? Yes    Pain Score 3     Pain Location Knee    Pain Orientation Left    Pain Descriptors / Indicators Aching    Pain Type Acute pain                               [facility] Adult PT Treatment/Exercise - 09/21/20 0001       Exercises   Exercises Knee/Hip      Knee/Hip Exercises: Stretches   Active Hamstring Stretch Both;2 reps;30 seconds    Active Hamstring Stretch Limitations seated    ITB Stretch Both;2 reps;30 seconds    ITB Stretch Limitations supine crossbody with strap      Knee/Hip Exercises: Aerobic   Recumbent Bike L3 x 6 min      Knee/Hip Exercises: Standing   Hip Flexion Stengthening;Both;2 sets;10 reps;Knee bent    Hip Flexion Limitations 2#    Hip ADduction Strengthening;Both;2 sets;10 reps    Hip ADduction Limitations 2#    Hip Abduction Stengthening;Both;2 sets;10 reps;Knee straight     Abduction Limitations 2#      Knee/Hip Exercises: Seated   Long Arc Quad Strengthening;Both;10 reps;Weights    Long Arc Quad Weight 1 lbs.    Long Arc Quad Limitations with ball squeezing    [employer] 10x5"      Knee/Hip Exercises: Supine   Quad Sets Both;Strengthening;20 reps    Straight Leg Raises Strengthening;Both;2 sets;10 reps    Straight Leg Raises Limitations 2#      Knee/Hip Exercises: Sidelying   Hip ADduction Strengthening;Both;2 sets;10 reps    Hip ADduction Limitations 2#                      PT Short Term Goals - 09/21/20 1517       PT SHORT TERM GOAL #1   Title Patient will be independent with initial HEP    Status Achieved    Target Date 09/18/20      PT SHORT TERM GOAL #2   Title Patient will be able to elicit good VMO activation with quad set on L knee    Status Achieved    Target Date 09/18/20  PT Long Term Goals - 09/10/20 1453       PT LONG TERM GOAL #1   Title Patient will be independent with ongoing/advanced HEP +/- gym program for self-management at home    Status On-going    Target Date 10/16/20      PT LONG TERM GOAL #2   Title Patient to improve L knee AROM to WNL without pain provocation    Status On-going    Target Date 10/16/20      PT LONG TERM GOAL #3   Title Patient will demonstrate improved B LE strength to >/= 5-/5 with good VMO activation/control for improved stability and ease of mobility    Status On-going    Target Date 10/16/20      PT LONG TERM GOAL #4   Title Patient will report no limitation with floor transfers at work due L knee pain or instability    Status On-going    Target Date 10/16/20      PT LONG TERM GOAL #5   Title Patient will report no further instances of L patellar subluxation during activity or while playing volleyball    Status On-going    Target Date 10/16/20                   Plan - 09/21/20 1535     Clinical Impression Statement Pt noted an  episode of patellar dislocation on last week while playing volleyball after a sudden stop. Pt was informed to refrain from high impact activities or anything that aggrevates her knees. Today was focused on hip strengthening and light knee exercises due to her patellar dislocation last week. She performs the exercises well with min need for cues. She shows a good VMO contraction with QS today and declines needing to review her inital HEP. She has met STGs 1 & 2 but needs reminders to keep from high impact activities to prevent further patellar dislocation.    Personal Factors and Comorbidities Time since onset of injury/illness/exacerbation;Comorbidity 2;Past/Current Experience    Comorbidities Chronic B knee pain, recent PT for L ankle instability, chronic migraines    PT Frequency 2x / week    PT Duration 8 weeks    PT Treatment/Interventions ADLs/Self Care Home Management;Cryotherapy;Electrical Stimulation;Iontophoresis 48m/ml Dexamethasone;Moist Heat;Ultrasound;Gait training;Stair training;Functional mobility training;Therapeutic activities;Therapeutic exercise;Balance training;Neuromuscular re-education;Patient/family education;Manual techniques;Passive range of motion;Dry needling;Taping;Vasopneumatic Device;Joint Manipulations    PT Next Visit Plan gentle L knee ROM and stretching; LE strengthening - emphasis on VMO activation & proximal stability; HEP update PRN; manual therapy and modalities PRN for pain/edema    PT Home Exercise Plan Access Code: [email or web address] (6/17, updated 7/7)    Consulted and Agree with Plan of Care Patient             Patient will benefit from skilled therapeutic intervention in order to improve the following deficits and impairments:  Abnormal gait, Decreased activity tolerance, Decreased balance, Decreased endurance, Decreased mobility, Decreased range of motion, Decreased strength, Difficulty walking, Hypermobility, Increased fascial restricitons, Increased muscle spasms,  Impaired perceived functional ability, Impaired flexibility, Improper body mechanics, Postural dysfunction, Pain  Visit Diagnosis: Acute pain of left knee  Stiffness of left knee, not elsewhere classified  Chronic pain of right knee  Muscle weakness (generalized)  Other symptoms and signs involving the musculoskeletal system  Other abnormalities of gait and mobility  Localized edema     Problem List Patient Active Problem List   Diagnosis Date Noted   Fever 04/18/2020   Headache 04/18/2020  Vancomycin adverse reaction, initial encounter 04/16/2020   Chronic migraine without aura without status migrainosus, not intractable 01/28/2020   Chronic pain of both knees 01/28/2020   Ankle ligament laxity, left 12/19/2019    Artist Pais, PTA 09/21/2020, 5:01 PM  [facility] [address]  [place], [place], [address] Phone: [phone]   Fax:  [phone]  Name: Kathryn Anderson MRN: [id number] Date of Birth: May 31, 2000   PHYSICAL THERAPY DISCHARGE SUMMARY  Visits from Start of Care: 3  Current functional level related to goals / functional outcomes:   Refer to above clinical impression for status as of last visit on 09/21/2020. MRI on 09/27/20 revealed bucket-handle tear medial meniscus and complete ACL tear, therefore PT held pending surgical consult. ACL reconstruction scheduled for 10/05/20, therefore will proceed with discharge from PT for this episode.   Remaining deficits:   As above. Pt will require surgical intervention for ACL and meniscal tears.   Education / Equipment:   HEP   Patient agrees to discharge. Patient goals were partially met. Patient is being discharged due to a change in medical status.   Percival Spanish, PT, MPT 12/08/20, 9:56 AM  [facility] [address]  [place], [place], [address] Phone: [phone]   Fax:   [phone]

## 2020-09-22 ENCOUNTER — Encounter: Payer: Self-pay | Admitting: Family Medicine

## 2020-09-22 DIAGNOSIS — G8929 Other chronic pain: Secondary | ICD-10-CM

## 2020-09-22 DIAGNOSIS — M25562 Pain in left knee: Secondary | ICD-10-CM

## 2020-09-22 MED ORDER — FLUTICASONE PROPIONATE HFA 44 MCG/ACT IN AERO: INHALATION_SPRAY | RESPIRATORY_TRACT | 3 refills | Status: AC

## 2020-09-23 ENCOUNTER — Ambulatory Visit (INDEPENDENT_AMBULATORY_CARE_PROVIDER_SITE_OTHER): Payer: Commercial Managed Care - PPO | Admitting: Family Medicine

## 2020-09-23 ENCOUNTER — Other Ambulatory Visit: Payer: Self-pay

## 2020-09-23 DIAGNOSIS — M25562 Pain in left knee: Secondary | ICD-10-CM | POA: Diagnosis not present

## 2020-09-23 NOTE — Progress Notes (Signed)
   Office Visit Note   Patient: Kathryn Anderson           Date of Birth: Mar 31, 2000           MRN: [id number] Visit Date: 09/23/2020 Requested by: Lavada Mesi, MD [address] [place],  [place] [address] PCP: Lavada Mesi, MD  Subjective: Chief Complaint  Patient presents with   Left Knee - Pain    HPI: She is here with left knee pain.  She was running a couple days ago and felt something pop, felt like her knee shifted.  She has been unable to bear full weight on it since then, unable to fully straighten her knee.  She is using crutches for ambulation.              ROS:   All other systems were reviewed and are negative.  Objective: Vital Signs: There were no vitals taken for this visit.  Physical Exam:  General:  Alert and oriented, in no acute distress. Pulm:  Breathing unlabored. Psy:  Normal mood, congruent affect. Skin: There is a bruise over the tibial tubercle which is not tender.  No erythema or warmth. Left knee: 2+ effusion, she cannot extend beyond 30 degrees flexion.  She is able to flex about 90 degrees.  Slight laxity with anterior drawer but I believe she has a solid endpoint.  No laxity with varus or valgus stress.  She is exquisitely tender on the medial joint line.  Imaging: No results found.  Assessment & Plan: Acute left knee pain and effusion, concerning for medial meniscus tear or possibly ACL tear -Discussed with patient and elected to aspirate today for symptom relief.  Hemarthrosis was present.  We will order MRI scan to further evaluate.     Procedures: Left knee aspiration: After sterile prep with Betadine, injected 5 cc 1% lidocaine without epinephrine and aspirated 30 cc bloody synovial fluid from superolateral approach.       PMFS History: Patient Active Problem List   Diagnosis Date Noted   Fever 04/18/2020   Headache 04/18/2020   Vancomycin adverse reaction, initial encounter 04/16/2020   Chronic migraine without aura without  status migrainosus, not intractable 01/28/2020   Chronic pain of both knees 01/28/2020   Ankle ligament laxity, left 12/19/2019   No past medical history on file.  Family History  Problem Relation Age of Onset   Healthy Mother    Healthy Father    Macular degeneration Maternal Grandmother    Glaucoma Maternal Grandmother    Dementia Paternal Grandfather    Heart disease Neg Hx     No past surgical history on file. Social History   Occupational History   Not on file  Tobacco Use   Smoking status: Never   Smokeless tobacco: Never  Substance and Sexual Activity   Alcohol use: Never   Drug use: Never   Sexual activity: Not on file

## 2020-09-27 ENCOUNTER — Ambulatory Visit
Admission: RE | Admit: 2020-09-27 | Discharge: 2020-09-27 | Disposition: A | Discharge status: Home / Self Care | Payer: No Typology Code available for payment source | Source: Ambulatory Visit | Attending: Family Medicine | Admitting: Family Medicine

## 2020-09-27 ENCOUNTER — Other Ambulatory Visit: Payer: Self-pay

## 2020-09-27 DIAGNOSIS — M25562 Pain in left knee: Secondary | ICD-10-CM

## 2020-09-28 ENCOUNTER — Telehealth: Payer: Self-pay | Admitting: Family Medicine

## 2020-09-28 DIAGNOSIS — M25562 Pain in left knee: Secondary | ICD-10-CM

## 2020-09-28 NOTE — Addendum Note (Signed)
Addended by: Lillia Carmel on: 09/28/2020 11:11 AM   Modules accepted: Orders

## 2020-09-28 NOTE — Telephone Encounter (Signed)
Left knee MRI scan reveals a complete ACL tear and a bucket-handle medial meniscus tear.

## 2020-09-30 ENCOUNTER — Other Ambulatory Visit: Payer: Self-pay

## 2020-09-30 ENCOUNTER — Encounter: Payer: Self-pay | Admitting: Orthopedic Surgery

## 2020-09-30 ENCOUNTER — Ambulatory Visit (INDEPENDENT_AMBULATORY_CARE_PROVIDER_SITE_OTHER): Payer: Commercial Managed Care - PPO | Admitting: Orthopedic Surgery

## 2020-09-30 DIAGNOSIS — S83512A Sprain of anterior cruciate ligament of left knee, initial encounter: Secondary | ICD-10-CM | POA: Diagnosis not present

## 2020-09-30 DIAGNOSIS — S83207A Unspecified tear of unspecified meniscus, current injury, left knee, initial encounter: Secondary | ICD-10-CM | POA: Diagnosis not present

## 2020-09-30 NOTE — Progress Notes (Signed)
Office Visit Note   Patient: Kathryn Anderson           Date of Birth: 12/10/2000           MRN: [id number] Visit Date: 09/30/2020 Requested by: Lavada Mesi, MD [address] [place],  [place] [address] PCP: Lavada Mesi, MD  Subjective: Chief Complaint  Patient presents with   Left Knee - Pain, Injury    HPI: Kathryn Anderson is a 20 year old patient who injured her knee 09/21/2020 playing volleyball.  She is really been unable to fully extend the knee since that injury.  No personal or family history of DVT or pulmonary embolism.  She does have a prior history of patellar dislocation/subluxation x2.  Last 1 of those was 2 months ago.  She does wear a brace for that.  MRI scan is reviewed and it does show ACL tear along with bucket-handle tear of the medial meniscus.  MPFL does not look adversely affected and overall patellofemoral anatomy is not unfavorable.              ROS: All systems reviewed are negative as they relate to the chief complaint within the history of present illness.  Patient denies  fevers or chills.   Assessment & Plan: Visit Diagnoses:  1. Tears of meniscus and anterior cruciate ligament of left knee, initial encounter     Plan: Impression is left knee ACL tear with locked medial meniscal tear and lack of full extension by about 20 degrees.  Patient has prior history of patellar dislocation as well.  MPFL looks intact on scan.  Plan at this time is ACL reconstruction using hamstring autograft.  Based on her stature I think it would be difficult to get a robust graft from her quad without taking most of the quad tendon.  This is confirmed on measurements of the quad on the MRI scan.  Inside-out meniscal repair also discussed.  We will assess MPFL at the time of surgery but do not necessarily anticipate intervention for that.  Risk and benefits of surgery are discussed including not limited to infection nerve vessel damage knee stiffness as well as 30 to 35% chance that the  meniscus repair may not heal.  Plan to use CPM machine after surgery.  Considered bone patella tendon bone graft but she has a slight abrasion in the region of the incision for that BTB.  I do not think this is a disqualify her from surgery next week but I am hesitant to put an incision around this region.  Patient understands the risk and benefits and wishes to proceed.  Parents here for discussion of treatment plan as well.  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  Follow-Up Instructions: No follow-ups on file.   Orders:  No orders of the defined types were placed in this encounter.  No orders of the defined types were placed in this encounter.     Procedures: No procedures performed   Clinical Data: No additional findings.  Objective: Vital Signs: There were no vitals taken for this visit.  Physical Exam:   Constitutional: Patient appears well-developed HEENT:  Head: Normocephalic Eyes:EOM are normal Neck: Normal range of motion Cardiovascular: Normal rate Pulmonary/chest: Effort normal Neurologic: Patient is alert Skin: Skin is warm Psychiatric: Patient has normal mood and affect   Ortho Exam: Ortho exam demonstrates full active and passive range of motion of the right knee.  Left knee lacks about 20 degrees  of full extension and flexion to about 90.  Collaterals feel stable.  No posterior lateral rotatory instability is noted.  Pedal pulses palpable.  No calf tenderness negative Homans' sign on the left knee.  Healing abrasion over the tibial tubercle just on the lateral side.  No redness or any sign of skin breakdown just looks like it is a abrasion that is near the end of its healing cycle.  Specialty Comments:  No specialty comments available.  Imaging: No results found.   PMFS History: Patient Active Problem List   Diagnosis Date Noted   Fever 04/18/2020   Headache 04/18/2020   Vancomycin adverse reaction, initial  encounter 04/16/2020   Chronic migraine without aura without status migrainosus, not intractable 01/28/2020   Chronic pain of both knees 01/28/2020   Ankle ligament laxity, left 12/19/2019   History reviewed. No pertinent past medical history.  Family History  Problem Relation Age of Onset   Healthy Mother    Healthy Father    Macular degeneration Maternal Grandmother    Glaucoma Maternal Grandmother    Dementia Paternal Grandfather    Heart disease Neg Hx     History reviewed. No pertinent surgical history. Social History   Occupational History   Not on file  Tobacco Use   Smoking status: Never   Smokeless tobacco: Never  Substance and Sexual Activity   Alcohol use: Never   Drug use: Never   Sexual activity: Not on file

## 2020-10-01 ENCOUNTER — Other Ambulatory Visit: Payer: Self-pay

## 2020-10-01 ENCOUNTER — Encounter: Payer: Commercial Managed Care - PPO | Admitting: Physical Therapy

## 2020-10-01 ENCOUNTER — Encounter: Payer: Self-pay | Admitting: Orthopedic Surgery

## 2020-10-01 ENCOUNTER — Encounter (HOSPITAL_BASED_OUTPATIENT_CLINIC_OR_DEPARTMENT_OTHER)
Admission: RE | Admit: 2020-10-01 | Discharge: 2020-10-01 | Disposition: A | Discharge status: Home / Self Care | Payer: Commercial Managed Care - PPO | Source: Ambulatory Visit | Attending: Orthopedic Surgery | Admitting: Orthopedic Surgery

## 2020-10-01 ENCOUNTER — Encounter (HOSPITAL_BASED_OUTPATIENT_CLINIC_OR_DEPARTMENT_OTHER): Payer: Self-pay | Admitting: Orthopedic Surgery

## 2020-10-01 DIAGNOSIS — Z01812 Encounter for preprocedural laboratory examination: Secondary | ICD-10-CM | POA: Insufficient documentation

## 2020-10-01 LAB — CBC
HCT: 37.7 % (ref 36.0–46.0)
Hemoglobin: 12.2 g/dL (ref 12.0–15.0)
MCH: 27.5 pg (ref 26.0–34.0)
MCHC: 32.4 g/dL (ref 30.0–36.0)
MCV: 84.9 fL (ref 80.0–100.0)
Platelets: 401 10*3/uL — ABNORMAL HIGH (ref 150–400)
RBC: 4.44 MIL/uL (ref 3.87–5.11)
RDW: 13.5 % (ref 11.5–15.5)
WBC: 11.3 10*3/uL — ABNORMAL HIGH (ref 4.0–10.5)
nRBC: 0 % (ref 0.0–0.2)

## 2020-10-01 LAB — BASIC METABOLIC PANEL
Anion gap: 8 (ref 5–15)
BUN: 18 mg/dL (ref 6–20)
CO2: 27 mmol/L (ref 22–32)
Calcium: 9.4 mg/dL (ref 8.9–10.3)
Chloride: 100 mmol/L (ref 98–111)
Creatinine, Ser: 0.58 mg/dL (ref 0.44–1.00)
GFR, Estimated: >60 mL/min (ref >60)
Glucose, Bld: 87 mg/dL (ref 70–99)
Potassium: 4.4 mmol/L (ref 3.5–5.1)
Sodium: 135 mmol/L (ref 135–145)

## 2020-10-01 NOTE — Progress Notes (Signed)

## 2020-10-02 ENCOUNTER — Telehealth: Payer: Self-pay | Admitting: Orthopedic Surgery

## 2020-10-02 NOTE — Telephone Encounter (Signed)
Pt mother called about some exercise machine she is suppose to have after surgery. She wants to get it today if possible.   CB [phone]

## 2020-10-02 NOTE — Telephone Encounter (Signed)
See other note for further documentation.  

## 2020-10-02 NOTE — Telephone Encounter (Signed)
Per Kipp Brood, they will reach out to patient today.

## 2020-10-02 NOTE — Telephone Encounter (Signed)
Patient's mom called back. She gave the wrong phone number to call her back about the machine. Her number is [phone]

## 2020-10-02 NOTE — Telephone Encounter (Signed)
I have Kathryn Anderson with [employer] to see what he can arrange

## 2020-10-04 NOTE — Anesthesia Preprocedure Evaluation (Addendum)
Anesthesia Evaluation  Patient identified by MRN, date of birth, ID band Patient awake    Reviewed: Allergy & Precautions, NPO status , Patient's Chart, lab work & pertinent test results  Airway Mallampati: I  TM Distance: >3 FB Neck ROM: Full    Dental no notable dental hx. (+) Teeth Intact, Dental Advisory Given   Pulmonary neg pulmonary ROS,    Pulmonary exam normal breath sounds clear to auscultation       Cardiovascular negative cardio ROS Normal cardiovascular exam Rhythm:Regular Rate:Normal     Neuro/Psych  Headaches, negative psych ROS   GI/Hepatic negative GI ROS, Neg liver ROS,   Endo/Other  negative endocrine ROS  Renal/GU negative Renal ROS  negative genitourinary   Musculoskeletal negative musculoskeletal ROS (+)   Abdominal   Peds  Hematology negative hematology ROS (+)   Anesthesia Other Findings L knee ACL and medial meniscus tear   Reproductive/Obstetrics negative OB ROS                            Anesthesia Physical Anesthesia Plan  ASA: 1  Anesthesia Plan: General and Regional   Post-op Pain Management: GA combined w/ Regional for post-op pain   Induction: Intravenous  PONV Risk Score and Plan: 3 and Ondansetron, Dexamethasone, Midazolam, Scopolamine patch - Pre-op and Treatment may vary due to age or medical condition  Airway Management Planned: LMA  Additional Equipment: None  Intra-op Plan:   Post-operative Plan: Extubation in OR  Informed Consent: I have reviewed the patients History and Physical, chart, labs and discussed the procedure including the risks, benefits and alternatives for the proposed anesthesia with the patient or authorized representative who has indicated his/her understanding and acceptance.     Dental advisory given  Plan Discussed with: CRNA  Anesthesia Plan Comments:         Anesthesia Quick Evaluation

## 2020-10-05 ENCOUNTER — Ambulatory Visit (HOSPITAL_COMMUNITY): Payer: Commercial Managed Care - PPO

## 2020-10-05 ENCOUNTER — Encounter (HOSPITAL_BASED_OUTPATIENT_CLINIC_OR_DEPARTMENT_OTHER)
Admission: RE | Disposition: A | Discharge status: Home / Self Care | Payer: Self-pay | Source: Home / Self Care | Attending: Orthopedic Surgery

## 2020-10-05 ENCOUNTER — Ambulatory Visit (HOSPITAL_BASED_OUTPATIENT_CLINIC_OR_DEPARTMENT_OTHER): Payer: Commercial Managed Care - PPO | Admitting: Anesthesiology

## 2020-10-05 ENCOUNTER — Encounter: Payer: Self-pay | Admitting: Orthopedic Surgery

## 2020-10-05 ENCOUNTER — Encounter (HOSPITAL_BASED_OUTPATIENT_CLINIC_OR_DEPARTMENT_OTHER): Payer: Self-pay | Admitting: Orthopedic Surgery

## 2020-10-05 ENCOUNTER — Other Ambulatory Visit: Payer: Self-pay

## 2020-10-05 ENCOUNTER — Ambulatory Visit (HOSPITAL_BASED_OUTPATIENT_CLINIC_OR_DEPARTMENT_OTHER)
Admission: RE | Admit: 2020-10-05 | Discharge: 2020-10-05 | Disposition: A | Discharge status: Home / Self Care | Payer: Commercial Managed Care - PPO | Attending: Orthopedic Surgery | Admitting: Orthopedic Surgery

## 2020-10-05 DIAGNOSIS — S83242D Other tear of medial meniscus, current injury, left knee, subsequent encounter: Secondary | ICD-10-CM

## 2020-10-05 DIAGNOSIS — Y9368 Activity, volleyball (beach) (court): Secondary | ICD-10-CM | POA: Diagnosis not present

## 2020-10-05 DIAGNOSIS — S83262A Peripheral tear of lateral meniscus, current injury, left knee, initial encounter: Secondary | ICD-10-CM

## 2020-10-05 DIAGNOSIS — S83212A Bucket-handle tear of medial meniscus, current injury, left knee, initial encounter: Secondary | ICD-10-CM | POA: Diagnosis not present

## 2020-10-05 DIAGNOSIS — S83262D Peripheral tear of lateral meniscus, current injury, left knee, subsequent encounter: Secondary | ICD-10-CM | POA: Diagnosis not present

## 2020-10-05 DIAGNOSIS — S83282A Other tear of lateral meniscus, current injury, left knee, initial encounter: Secondary | ICD-10-CM | POA: Diagnosis not present

## 2020-10-05 DIAGNOSIS — S83512A Sprain of anterior cruciate ligament of left knee, initial encounter: Secondary | ICD-10-CM | POA: Insufficient documentation

## 2020-10-05 DIAGNOSIS — X58XXXA Exposure to other specified factors, initial encounter: Secondary | ICD-10-CM | POA: Insufficient documentation

## 2020-10-05 DIAGNOSIS — Z881 Allergy status to other antibiotic agents status: Secondary | ICD-10-CM | POA: Diagnosis not present

## 2020-10-05 DIAGNOSIS — Z7951 Long term (current) use of inhaled steroids: Secondary | ICD-10-CM | POA: Insufficient documentation

## 2020-10-05 DIAGNOSIS — S83242A Other tear of medial meniscus, current injury, left knee, initial encounter: Secondary | ICD-10-CM

## 2020-10-05 DIAGNOSIS — S83512D Sprain of anterior cruciate ligament of left knee, subsequent encounter: Secondary | ICD-10-CM

## 2020-10-05 HISTORY — PX: KNEE ARTHROSCOPY WITH MENISCAL REPAIR: SHX5653

## 2020-10-05 HISTORY — DX: Headache, unspecified: R51.9

## 2020-10-05 HISTORY — PX: ANTERIOR CRUCIATE LIGAMENT REPAIR: SHX115

## 2020-10-05 LAB — POCT PREGNANCY, URINE: Preg Test, Ur: NEGATIVE

## 2020-10-05 SURGERY — RECONSTRUCTION, KNEE, ACL, USING HAMSTRING GRAFT
Anesthesia: Regional | Site: Knee | Laterality: Left

## 2020-10-05 MED ORDER — CEFAZOLIN SODIUM-DEXTROSE 2-4 GM/100ML-% IV SOLN
INTRAVENOUS | Status: AC
  Filled 2020-10-05: qty 100

## 2020-10-05 MED ORDER — KETOROLAC TROMETHAMINE 30 MG/ML IJ SOLN: 30.0000 mg | Freq: Once | INTRAMUSCULAR | Status: DC | PRN

## 2020-10-05 MED ORDER — MORPHINE SULFATE (PF) 4 MG/ML IV SOLN
INTRAVENOUS | Status: DC | PRN
  Administered 2020-10-05: 8 mg via INTRAVENOUS

## 2020-10-05 MED ORDER — OXYCODONE HCL 5 MG/5ML PO SOLN: 5.0000 mg | Freq: Once | ORAL | Status: AC | PRN

## 2020-10-05 MED ORDER — HYDROMORPHONE HCL 1 MG/ML IJ SOLN
INTRAMUSCULAR | Status: AC
  Filled 2020-10-05: qty 0.5

## 2020-10-05 MED ORDER — ONDANSETRON HCL 4 MG/2ML IJ SOLN
INTRAMUSCULAR | Status: AC
  Filled 2020-10-05: qty 2

## 2020-10-05 MED ORDER — FENTANYL CITRATE (PF) 100 MCG/2ML IJ SOLN
100.0000 ug | Freq: Once | INTRAMUSCULAR | Status: AC
Start: 2020-10-05 — End: 2020-10-05
  Administered 2020-10-05: 100 ug via INTRAVENOUS

## 2020-10-05 MED ORDER — SCOPOLAMINE 1 MG/3DAYS TD PT72
1.0000 | MEDICATED_PATCH | TRANSDERMAL | Status: DC
  Administered 2020-10-05: 1.5 mg via TRANSDERMAL

## 2020-10-05 MED ORDER — VANCOMYCIN HCL 500 MG IV SOLR
INTRAVENOUS | Status: DC | PRN
  Administered 2020-10-05: 500 mg via TOPICAL

## 2020-10-05 MED ORDER — SODIUM CHLORIDE 0.9 % IR SOLN
Status: DC | PRN
  Administered 2020-10-05: 12000 mL

## 2020-10-05 MED ORDER — OXYCODONE HCL 5 MG PO TABS
5.0000 mg | ORAL_TABLET | Freq: Once | ORAL | Status: AC | PRN
Start: 2020-10-05 — End: 2020-10-05
  Administered 2020-10-05: 5 mg via ORAL

## 2020-10-05 MED ORDER — DIPHENHYDRAMINE HCL 50 MG/ML IJ SOLN
6.2500 mg | INTRAMUSCULAR | Status: DC | PRN
Start: 2020-10-05 — End: 2020-10-05
  Administered 2020-10-05: 6.5 mg via INTRAVENOUS

## 2020-10-05 MED ORDER — ALBUTEROL SULFATE HFA 108 (90 BASE) MCG/ACT IN AERS
INHALATION_SPRAY | RESPIRATORY_TRACT | Status: DC | PRN
  Administered 2020-10-05: 2 via RESPIRATORY_TRACT

## 2020-10-05 MED ORDER — LIDOCAINE HCL (PF) 2 % IJ SOLN
INTRAMUSCULAR | Status: AC
  Filled 2020-10-05: qty 5

## 2020-10-05 MED ORDER — FENTANYL CITRATE (PF) 100 MCG/2ML IJ SOLN
INTRAMUSCULAR | Status: AC
  Filled 2020-10-05: qty 2

## 2020-10-05 MED ORDER — SODIUM CHLORIDE 0.9 % IR SOLN
Status: DC | PRN
Start: 2020-10-05 — End: 2020-10-05
  Administered 2020-10-05: 27000 mL

## 2020-10-05 MED ORDER — CEFAZOLIN SODIUM-DEXTROSE 2-4 GM/100ML-% IV SOLN
2.0000 g | INTRAVENOUS | Status: AC
  Administered 2020-10-05 (×2): 2 g via INTRAVENOUS

## 2020-10-05 MED ORDER — ACETAMINOPHEN 500 MG PO TABS
1000.0000 mg | ORAL_TABLET | Freq: Once | ORAL | Status: AC
  Administered 2020-10-05: 1000 mg via ORAL

## 2020-10-05 MED ORDER — CLONIDINE HCL (ANALGESIA) 100 MCG/ML EP SOLN
EPIDURAL | Status: DC | PRN
  Administered 2020-10-05: .75 mL

## 2020-10-05 MED ORDER — PROPOFOL 10 MG/ML IV BOLUS
INTRAVENOUS | Status: DC | PRN
  Administered 2020-10-05: 200 mg via INTRAVENOUS

## 2020-10-05 MED ORDER — PROPOFOL 10 MG/ML IV BOLUS
INTRAVENOUS | Status: AC
  Filled 2020-10-05: qty 20

## 2020-10-05 MED ORDER — BUPIVACAINE-EPINEPHRINE (PF) 0.25% -1:200000 IJ SOLN
INTRAMUSCULAR | Status: AC
  Filled 2020-10-05: qty 30

## 2020-10-05 MED ORDER — LACTATED RINGERS IV SOLN: INTRAVENOUS | Status: DC

## 2020-10-05 MED ORDER — DIPHENHYDRAMINE HCL 50 MG/ML IJ SOLN
INTRAMUSCULAR | Status: AC
  Filled 2020-10-05: qty 1

## 2020-10-05 MED ORDER — KETAMINE HCL 100 MG/ML IJ SOLN
INTRAMUSCULAR | Status: DC | PRN
  Administered 2020-10-05: 35 mg via INTRAMUSCULAR

## 2020-10-05 MED ORDER — DEXAMETHASONE SODIUM PHOSPHATE 4 MG/ML IJ SOLN
INTRAMUSCULAR | Status: DC | PRN
  Administered 2020-10-05: 6 mg via INTRAVENOUS

## 2020-10-05 MED ORDER — ASPIRIN 81 MG PO CHEW: 81.0000 mg | CHEWABLE_TABLET | Freq: Every day | ORAL | 0 refills | Status: AC

## 2020-10-05 MED ORDER — HYDROCODONE-ACETAMINOPHEN 5-325 MG PO TABS: 1.0000 | ORAL_TABLET | Freq: Four times a day (QID) | ORAL | 0 refills | Status: DC | PRN

## 2020-10-05 MED ORDER — MIDAZOLAM HCL 2 MG/2ML IJ SOLN
INTRAMUSCULAR | Status: AC
  Filled 2020-10-05: qty 2

## 2020-10-05 MED ORDER — KETAMINE HCL 100 MG/ML IJ SOLN
INTRAMUSCULAR | Status: AC
  Filled 2020-10-05: qty 1

## 2020-10-05 MED ORDER — SUGAMMADEX SODIUM 200 MG/2ML IV SOLN
INTRAVENOUS | Status: DC | PRN
  Administered 2020-10-05: 200 mg via INTRAVENOUS

## 2020-10-05 MED ORDER — ACETAMINOPHEN 500 MG PO TABS
ORAL_TABLET | ORAL | Status: AC
  Filled 2020-10-05: qty 2

## 2020-10-05 MED ORDER — DEXMEDETOMIDINE (PRECEDEX) IN NS 20 MCG/5ML (4 MCG/ML) IV SYRINGE
PREFILLED_SYRINGE | INTRAVENOUS | Status: AC
  Filled 2020-10-05: qty 5

## 2020-10-05 MED ORDER — CLONIDINE HCL (ANALGESIA) 100 MCG/ML EP SOLN
EPIDURAL | Status: AC
  Filled 2020-10-05: qty 10

## 2020-10-05 MED ORDER — ONDANSETRON HCL 4 MG/2ML IJ SOLN
INTRAMUSCULAR | Status: DC | PRN
  Administered 2020-10-05: 4 mg via INTRAVENOUS

## 2020-10-05 MED ORDER — ROCURONIUM BROMIDE 100 MG/10ML IV SOLN
INTRAVENOUS | Status: DC | PRN
  Administered 2020-10-05: 70 mg via INTRAVENOUS

## 2020-10-05 MED ORDER — ACETAMINOPHEN 10 MG/ML IV SOLN
1000.0000 mg | Freq: Once | INTRAVENOUS | Status: AC
  Administered 2020-10-05: 1000 mg via INTRAVENOUS

## 2020-10-05 MED ORDER — PROMETHAZINE HCL 25 MG/ML IJ SOLN: 6.2500 mg | INTRAMUSCULAR | Status: DC | PRN

## 2020-10-05 MED ORDER — ACETAMINOPHEN 10 MG/ML IV SOLN
INTRAVENOUS | Status: AC
  Filled 2020-10-05: qty 100

## 2020-10-05 MED ORDER — PHENYLEPHRINE 40 MCG/ML (10ML) SYRINGE FOR IV PUSH (FOR BLOOD PRESSURE SUPPORT)
PREFILLED_SYRINGE | INTRAVENOUS | Status: AC
  Filled 2020-10-05: qty 10

## 2020-10-05 MED ORDER — DEXAMETHASONE SODIUM PHOSPHATE 10 MG/ML IJ SOLN
INTRAMUSCULAR | Status: DC | PRN
  Administered 2020-10-05: 10 mg

## 2020-10-05 MED ORDER — FENTANYL CITRATE (PF) 100 MCG/2ML IJ SOLN
INTRAMUSCULAR | Status: DC | PRN
  Administered 2020-10-05 (×5): 25 ug via INTRAVENOUS
  Administered 2020-10-05 (×2): 50 ug via INTRAVENOUS
  Administered 2020-10-05: 25 ug via INTRAVENOUS
  Administered 2020-10-05: 50 ug via INTRAVENOUS

## 2020-10-05 MED ORDER — KETOROLAC TROMETHAMINE 30 MG/ML IJ SOLN
INTRAMUSCULAR | Status: DC | PRN
  Administered 2020-10-05: 30 mg via INTRAVENOUS

## 2020-10-05 MED ORDER — CEFAZOLIN SODIUM 1 G IJ SOLR
INTRAMUSCULAR | Status: AC
  Filled 2020-10-05: qty 20

## 2020-10-05 MED ORDER — VANCOMYCIN HCL 500 MG IV SOLR
INTRAVENOUS | Status: AC
  Filled 2020-10-05: qty 500

## 2020-10-05 MED ORDER — MEPERIDINE HCL 25 MG/ML IJ SOLN: 6.2500 mg | INTRAMUSCULAR | Status: DC | PRN

## 2020-10-05 MED ORDER — HYDROMORPHONE HCL 1 MG/ML IJ SOLN
0.2500 mg | INTRAMUSCULAR | Status: DC | PRN
  Administered 2020-10-05 (×3): 0.25 mg via INTRAVENOUS

## 2020-10-05 MED ORDER — POVIDONE-IODINE 10 % EX SWAB: 2.0000 "application " | Freq: Once | CUTANEOUS | Status: DC

## 2020-10-05 MED ORDER — OXYCODONE HCL 5 MG PO TABS
ORAL_TABLET | ORAL | Status: AC
  Filled 2020-10-05: qty 1

## 2020-10-05 MED ORDER — PHENYLEPHRINE HCL (PRESSORS) 10 MG/ML IV SOLN
INTRAVENOUS | Status: DC | PRN
  Administered 2020-10-05: 80 ug via INTRAVENOUS
  Administered 2020-10-05: 40 ug via INTRAVENOUS

## 2020-10-05 MED ORDER — DOCUSATE SODIUM 100 MG PO CAPS: 100.0000 mg | ORAL_CAPSULE | Freq: Every day | ORAL | 2 refills | Status: AC | PRN

## 2020-10-05 MED ORDER — METHOCARBAMOL 500 MG PO TABS
500.0000 mg | ORAL_TABLET | Freq: Three times a day (TID) | ORAL | 0 refills | Status: AC | PRN
Start: 2020-10-05 — End: ?

## 2020-10-05 MED ORDER — EPINEPHRINE PF 1 MG/ML IJ SOLN
INTRAMUSCULAR | Status: AC
  Filled 2020-10-05: qty 4

## 2020-10-05 MED ORDER — BUPIVACAINE-EPINEPHRINE 0.25% -1:200000 IJ SOLN
INTRAMUSCULAR | Status: DC | PRN
  Administered 2020-10-05: 19 mL

## 2020-10-05 MED ORDER — BUPIVACAINE HCL (PF) 0.5 % IJ SOLN
INTRAMUSCULAR | Status: DC | PRN
  Administered 2020-10-05: 25 mL via PERINEURAL

## 2020-10-05 MED ORDER — LIDOCAINE 2% (20 MG/ML) 5 ML SYRINGE
INTRAMUSCULAR | Status: DC | PRN
  Administered 2020-10-05: 30 mg via INTRAVENOUS

## 2020-10-05 MED ORDER — DEXAMETHASONE SODIUM PHOSPHATE 10 MG/ML IJ SOLN
INTRAMUSCULAR | Status: AC
  Filled 2020-10-05: qty 1

## 2020-10-05 MED ORDER — AMISULPRIDE (ANTIEMETIC) 5 MG/2ML IV SOLN: 10.0000 mg | Freq: Once | INTRAVENOUS | Status: DC | PRN

## 2020-10-05 MED ORDER — MORPHINE SULFATE (PF) 4 MG/ML IV SOLN
INTRAVENOUS | Status: AC
  Filled 2020-10-05: qty 2

## 2020-10-05 MED ORDER — HYDROMORPHONE HCL 1 MG/ML IJ SOLN
INTRAMUSCULAR | Status: DC | PRN
  Administered 2020-10-05: .5 mg via INTRAVENOUS

## 2020-10-05 MED ORDER — MIDAZOLAM HCL 2 MG/2ML IJ SOLN
2.0000 mg | Freq: Once | INTRAMUSCULAR | Status: AC
  Administered 2020-10-05: 2 mg via INTRAVENOUS

## 2020-10-05 MED ORDER — SCOPOLAMINE 1 MG/3DAYS TD PT72
MEDICATED_PATCH | TRANSDERMAL | Status: AC
  Filled 2020-10-05: qty 1

## 2020-10-05 MED ORDER — DEXMEDETOMIDINE (PRECEDEX) IN NS 20 MCG/5ML (4 MCG/ML) IV SYRINGE
PREFILLED_SYRINGE | INTRAVENOUS | Status: DC | PRN
  Administered 2020-10-05 (×5): 4 ug via INTRAVENOUS

## 2020-10-05 MED ORDER — POVIDONE-IODINE 7.5 % EX SOLN: Freq: Once | CUTANEOUS | Status: DC

## 2020-10-05 SURGICAL SUPPLY — 112 items
ANCH SUT 2-0 CVD FBRSTCH PLSTR (Anchor) ×3 IMPLANT
ANCHOR BUTTON TIGHTROPE RN 14 (Anchor) ×2 IMPLANT
ANCHOR TIGHTROPE II 20 (Anchor) ×2 IMPLANT
ANCHOR TIGHTROPE II 20 W/IB (Anchor) ×2 IMPLANT
BANDAGE ESMARK 6X9 LF (GAUZE/BANDAGES/DRESSINGS) IMPLANT
BLADE EXCALIBUR 4.0X13 (MISCELLANEOUS) ×2 IMPLANT
BLADE SHAVER TORPEDO 4X13 (MISCELLANEOUS) IMPLANT
BLADE SURG 10 STRL SS (BLADE) ×2 IMPLANT
BLADE SURG 15 STRL LF DISP TIS (BLADE) ×2 IMPLANT
BLADE SURG 15 STRL SS (BLADE) ×4
BNDG CMPR 9X6 STRL LF SNTH (GAUZE/BANDAGES/DRESSINGS)
BNDG ELASTIC 4X5.8 VLCR STR LF (GAUZE/BANDAGES/DRESSINGS) ×2 IMPLANT
BNDG ELASTIC 6X5.8 VLCR STR LF (GAUZE/BANDAGES/DRESSINGS) ×2 IMPLANT
BNDG ESMARK 6X9 LF (GAUZE/BANDAGES/DRESSINGS)
BURR OVAL 8 FLU 4.0X13 (MISCELLANEOUS) ×2 IMPLANT
CANN ZONE NAVIGATOR RT DISP (ORTHOPEDIC DISPOSABLE SUPPLIES) ×1
CANNULA ZONE NAVIGATOR RT DISP (ORTHOPEDIC DISPOSABLE SUPPLIES) ×1 IMPLANT
COOLER ICEMAN CLASSIC (MISCELLANEOUS) ×2 IMPLANT
COVER BACK TABLE 60X90IN (DRAPES) ×2 IMPLANT
CUFF TOURN SGL QUICK 34 (TOURNIQUET CUFF) ×2
CUFF TRNQT CYL 34X4.125X (TOURNIQUET CUFF) ×1 IMPLANT
CUTTER TENSIONER SUT 2-0 0 FBW (INSTRUMENTS) ×2 IMPLANT
DISSECTOR  3.8MM X 13CM (MISCELLANEOUS)
DISSECTOR 3.8MM X 13CM (MISCELLANEOUS) IMPLANT
DISSECTOR 4.0MM X 13CM (MISCELLANEOUS) ×2 IMPLANT
DRAPE ARTHROSCOPY W/POUCH 90 (DRAPES) ×2 IMPLANT
DRAPE IMP U-DRAPE 54X76 (DRAPES) IMPLANT
DRAPE INCISE IOBAN 66X45 STRL (DRAPES) ×2 IMPLANT
DRAPE OEC MINIVIEW 54X84 (DRAPES) ×2 IMPLANT
DRAPE U-SHAPE 47X51 STRL (DRAPES) ×2 IMPLANT
DRILL FLIPCUTTER III 6-12 (ORTHOPEDIC DISPOSABLE SUPPLIES) ×1 IMPLANT
DRSG PAD ABDOMINAL 8X10 ST (GAUZE/BANDAGES/DRESSINGS) IMPLANT
DRSG TEGADERM 4X4.75 (GAUZE/BANDAGES/DRESSINGS) ×8 IMPLANT
DURAPREP 26ML APPLICATOR (WOUND CARE) ×4 IMPLANT
DW OUTFLOW CASSETTE/TUBE SET (MISCELLANEOUS) ×2 IMPLANT
ELECT REM PT RETURN 9FT ADLT (ELECTROSURGICAL) ×2
ELECTRODE REM PT RTRN 9FT ADLT (ELECTROSURGICAL) ×1 IMPLANT
EXCALIBUR 3.8MM X 13CM (MISCELLANEOUS) IMPLANT
FIBERSTICK 2 (SUTURE) ×4 IMPLANT
FLIPCUTTER III 6-12 AR-1204FF (ORTHOPEDIC DISPOSABLE SUPPLIES) ×2
GAUZE SPONGE 4X4 12PLY STRL (GAUZE/BANDAGES/DRESSINGS) ×2 IMPLANT
GAUZE XEROFORM 1X8 LF (GAUZE/BANDAGES/DRESSINGS) ×2 IMPLANT
GLOVE SRG 8 PF TXTR STRL LF DI (GLOVE) ×1 IMPLANT
GLOVE SURG LTX SZ6.5 (GLOVE) ×2 IMPLANT
GLOVE SURG LTX SZ8 (GLOVE) ×4 IMPLANT
GLOVE SURG UNDER POLY LF SZ7 (GLOVE) ×2 IMPLANT
GLOVE SURG UNDER POLY LF SZ8 (GLOVE) ×2
GOWN STRL REUS W/ TWL LRG LVL3 (GOWN DISPOSABLE) ×2 IMPLANT
GOWN STRL REUS W/ TWL XL LVL3 (GOWN DISPOSABLE) ×2 IMPLANT
GOWN STRL REUS W/TWL LRG LVL3 (GOWN DISPOSABLE) ×4
GOWN STRL REUS W/TWL XL LVL3 (GOWN DISPOSABLE) ×4
IMMOBILIZER KNEE 22 UNIV (SOFTGOODS) ×2 IMPLANT
IMMOBILIZER KNEE 24 THIGH 36 (MISCELLANEOUS) IMPLANT
IMMOBILIZER KNEE 24 UNIV (MISCELLANEOUS)
IMP SYS 2ND FIX PEEK 4.75X19.1 (Miscellaneous) ×2 IMPLANT
IMPL FIBERSTICH 2-0 CVD (Anchor) ×3 IMPLANT
IMPL SYS 2ND FX PEEK 4.75X19.1 (Miscellaneous) ×1 IMPLANT
IMPLANT FIBERSTICH 2-0 CVD (Anchor) ×6 IMPLANT
KIT BIOCARTILAGE LG JOINT MIX (KITS) ×2 IMPLANT
MANIFOLD NEPTUNE II (INSTRUMENTS) ×4 IMPLANT
MID POST RT CANNULA AR7910R (ORTHOPEDIC DISPOSABLE SUPPLIES) ×2
NDL SAFETY ECLIPSE 18X1.5 (NEEDLE) ×2 IMPLANT
NEEDLE HYPO 18GX1.5 BLUNT FILL (NEEDLE) ×2 IMPLANT
NEEDLE HYPO 18GX1.5 SHARP (NEEDLE) ×4
NEEDLE SPNL 18GX3.5 QUINCKE PK (NEEDLE) IMPLANT
PACK ARTHROSCOPY DSU (CUSTOM PROCEDURE TRAY) ×2 IMPLANT
PACK BASIN DAY SURGERY FS (CUSTOM PROCEDURE TRAY) ×2 IMPLANT
PAD CAST 4YDX4 CTTN HI CHSV (CAST SUPPLIES) IMPLANT
PAD COLD SHLDR WRAP-ON (PAD) ×2 IMPLANT
PADDING CAST COTTON 4X4 STRL (CAST SUPPLIES)
PADDING CAST COTTON 6X4 STRL (CAST SUPPLIES) ×2 IMPLANT
PENCIL SMOKE EVACUATOR (MISCELLANEOUS) ×2 IMPLANT
PORT APPOLLO RF 90DEGREE MULTI (SURGICAL WAND) IMPLANT
PORTAL SKID DEVICE (INSTRUMENTS) ×2 IMPLANT
PUTTY DBM ALLOSYNC PURE 5CC (Putty) ×2 IMPLANT
SLEEVE SCD COMPRESS KNEE MED (STOCKING) ×2 IMPLANT
SPONGE T-LAP 18X18 ~~LOC~~+RFID (SPONGE) ×4 IMPLANT
SPONGE T-LAP 4X18 ~~LOC~~+RFID (SPONGE) IMPLANT
STRIP CLOSURE SKIN 1/2X4 (GAUZE/BANDAGES/DRESSINGS) ×2 IMPLANT
SUCTION FRAZIER HANDLE 10FR (MISCELLANEOUS) ×1
SUCTION TUBE FRAZIER 10FR DISP (MISCELLANEOUS) ×1 IMPLANT
SUT 0 FIBERLOOP 38 BLUE TPR ND (SUTURE) ×4
SUT ETHILON 3 0 PS 1 (SUTURE) ×6 IMPLANT
SUT FIBERWIRE #2 38 T-5 BLUE (SUTURE)
SUT FIBERWIRE 2-0 18 17.9 3/8 (SUTURE)
SUT MNCRL AB 3-0 PS2 18 (SUTURE) ×2 IMPLANT
SUT VIC AB 0 CT1 27 (SUTURE) ×4
SUT VIC AB 0 CT1 27XBRD ANBCTR (SUTURE) ×2 IMPLANT
SUT VIC AB 1 CT1 27 (SUTURE) ×2
SUT VIC AB 1 CT1 27XBRD ANBCTR (SUTURE) ×1 IMPLANT
SUT VIC AB 2-0 CT1 27 (SUTURE) ×4
SUT VIC AB 2-0 CT1 TAPERPNT 27 (SUTURE) ×2 IMPLANT
SUT VIC AB 2-0 SH 27 (SUTURE) ×2
SUT VIC AB 2-0 SH 27XBRD (SUTURE) ×1 IMPLANT
SUT VICRYL 0 UR6 27IN ABS (SUTURE) ×6 IMPLANT
SUTURE 0 FIBERLP 38 BLU TPR ND (SUTURE) ×2 IMPLANT
SUTURE FIBERWR #2 38 T-5 BLUE (SUTURE) IMPLANT
SUTURE FIBERWR 2-0 18 17.9 3/8 (SUTURE) IMPLANT
SUTURE TAPE 1.3 FIBERLOP 20 ST (SUTURE) ×2 IMPLANT
SUTURE TAPE 2-0 MENISCS NDL (SUTURE) ×7 IMPLANT
SUTURE TAPE TIGERLINK 1.3MM BL (SUTURE) IMPLANT
SUTURETAPE 1.3 FIBERLOOP 20 ST (SUTURE) ×4
SUTURETAPE 2-0 MENISCS NDL (SUTURE) ×14
SUTURETAPE TIGERLINK 1.3MM BL (SUTURE)
SYR 5ML LL (SYRINGE) ×2 IMPLANT
SYR BULB IRRIG 60ML STRL (SYRINGE) IMPLANT
SYR TB 1ML LL NO SAFETY (SYRINGE) ×2 IMPLANT
TOWEL GREEN STERILE FF (TOWEL DISPOSABLE) ×4 IMPLANT
TRAY DSU PREP LF (CUSTOM PROCEDURE TRAY) ×2 IMPLANT
TUBING ARTHROSCOPY IRRIG 16FT (MISCELLANEOUS) ×2 IMPLANT
WRAP KNEE MAXI GEL POST OP (GAUZE/BANDAGES/DRESSINGS) IMPLANT
YANKAUER SUCT BULB TIP NO VENT (SUCTIONS) ×2 IMPLANT

## 2020-10-05 NOTE — Brief Op Note (Signed)
   10/05/2020  12:59 PM  PATIENT:  Kathryn Anderson  20 y.o. female  PRE-OPERATIVE DIAGNOSIS:  left knee anterior cruciate ligament tear, medial meniscal tear  POST-OPERATIVE DIAGNOSIS:  left knee anterior cruciate ligament tear, medial meniscal tear lateral meniscal tear  PROCEDURE:  Procedure(s): LEFT KNEE ANTERIOR CRUCIATE LIGAMENT (ACL) RECONSTRUCTION WITH HAMSTRING AUTOGRAFT MEDIAL MENISCAL REPAIR lateral meniscal tear repair  SURGEON:  Surgeon(s): Cammy Copa, MD  ASSISTANT: magnant pa  ANESTHESIA:   general  EBL: 29 ml    Total I/O In: 1300 [I.V.:1300] Out: 20 [Blood:20]  BLOOD ADMINISTERED: none  DRAINS: none   LOCAL MEDICATIONS USED:  marcaine mso4 clonidine  SPECIMEN:  No Specimen  COUNTS:  YES  TOURNIQUET:   Total Tourniquet Time Documented: Thigh (Left) - 118 minutes Total: Thigh (Left) - 118 minutes   DICTATION: .Other Dictation: Dictation Number [id number]  PLAN OF CARE: Discharge to home after PACU  PATIENT DISPOSITION:  PACU - hemodynamically stable

## 2020-10-05 NOTE — Anesthesia Procedure Notes (Signed)
Procedure Name: Intubation Date/Time: 10/05/2020 7:44 AM Performed by: Ezequiel Kayser, CRNA Pre-anesthesia Checklist: Patient identified, Emergency Drugs available, Suction available and Patient being monitored Patient Re-evaluated:Patient Re-evaluated prior to induction Oxygen Delivery Method: Circle System Utilized Preoxygenation: Pre-oxygenation with 100% oxygen Induction Type: IV induction Ventilation: Mask ventilation without difficulty Laryngoscope Size: Mac and 3 Grade View: Grade I Tube type: Oral Tube size: 8.0 mm Number of attempts: 1 Airway Equipment and Method: Stylet and Oral airway Placement Confirmation: ETT inserted through vocal cords under direct vision, positive ETCO2 and breath sounds checked- equal and bilateral Secured at: 20 cm Tube secured with: Tape Dental Injury: Teeth and Oropharynx as per pre-operative assessment

## 2020-10-05 NOTE — Transfer of Care (Signed)
Immediate Anesthesia Transfer of Care Note  Patient: [name]  Procedure(s) Performed: LEFT KNEE ANTERIOR CRUCIATE LIGAMENT (ACL) RECONSTRUCTION WITH HAMSTRING AUTOGRAFT (Left: Knee) MEDIAL MENISCAL REPAIR (Left: Knee)  Patient Location: PACU  Anesthesia Type:General and Regional  Level of Consciousness: drowsy  Airway & Oxygen Therapy: Patient Spontanous Breathing and Patient connected to face mask oxygen  Post-op Assessment: Report given to RN and Post -op Vital signs reviewed and stable  Post vital signs: Reviewed and stable  Last Vitals:  Vitals Value Taken Time  BP 114/61 10/05/20 1251  Temp    Pulse 128 10/05/20 1258  Resp 20 10/05/20 1258  SpO2 100 % 10/05/20 1258  Vitals shown include unvalidated device data.  Last Pain:  Vitals:   10/05/20 0720  TempSrc:   PainSc: 0-No pain         Complications: No notable events documented.

## 2020-10-05 NOTE — H&P (Signed)
Kathryn Anderson is an 20 y.o. female.   Chief Complaint: Left knee pain HPI: [name] is a 20 year old patient who injured her knee 09/21/2020 playing volleyball.  She has really been unable to fully extend the knee since that injury.  No personal or family history of DVT or pulmonary embolism.  She does have a prior history of patellar dislocation/subluxation x2.  Last 1 of those was 2 months ago.  She does wear a brace for that.  MRI scan is reviewed and it does show ACL tear along with bucket-handle tear of the medial meniscus.  MPFL does not look adversely affected and overall patellofemoral anatomy is not unfavorable .   no personal or family history of DVT or pulmonary embolism.  Past Medical History:  Diagnosis Date   Headache     Past Surgical History:  Procedure Laterality Date   WISDOM TOOTH EXTRACTION      Family History  Problem Relation Age of Onset   Healthy Mother    Healthy Father    Macular degeneration Maternal Grandmother    Glaucoma Maternal Grandmother    Dementia Paternal Grandfather    Heart disease Neg Hx    Social History:  reports that she has never smoked. She has never used smokeless tobacco. She reports that she does not drink alcohol and does not use drugs.  Allergies:  Allergies  Allergen Reactions   Vancomycin Itching    Patient had itching of her scalp and hands after initial dose of vancomycin.  Subsequently tolerated well with Benadryl.    Medications Prior to Admission  Medication Sig Dispense Refill   cholecalciferol (VITAMIN D3) 25 MCG (1000 UNIT) tablet Take 1,000 Units by mouth daily.     fluticasone (FLOVENT HFA) 44 MCG/ACT inhaler 2 Puffs BID x 1 month, then 1 Puff BID x 1 month 1 each 3   ibuprofen (ADVIL) 200 MG tablet Take 200 mg by mouth every 6 (six) hours as needed for headache or mild pain.     Iron-Vitamin C (IRON 100/C PO) Take by mouth.     magnesium 30 MG tablet Take 30 mg by mouth at bedtime.     montelukast (SINGULAIR) 10 MG  tablet Take 0.5-1 tablets (5-10 mg total) by mouth at bedtime as needed. 30 tablet 6   NON FORMULARY      promethazine (PHENERGAN) 12.5 MG tablet Take 0.5-1 tablets (6.25-12.5 mg total) by mouth every 6 (six) hours as needed for nausea or vomiting. 30 tablet 1   vitamin C (ASCORBIC ACID) 500 MG tablet Take 500 mg by mouth daily.     vitamin k 100 MCG tablet Take 100 mcg by mouth daily.     ondansetron (ZOFRAN) 4 MG tablet Take 1 tablet (4 mg total) by mouth every 8 (eight) hours as needed for nausea or vomiting. 10 tablet 0    Results for orders placed or performed during the hospital encounter of 10/05/20 (from the past 48 hour(s))  Pregnancy, urine POC     Status: None   Collection Time: 10/05/20  6:57 AM  Result Value Ref Range   Preg Test, Ur NEGATIVE NEGATIVE    Comment:        THE SENSITIVITY OF THIS METHODOLOGY IS >24 mIU/mL    No results found.  Review of Systems  Musculoskeletal:  Positive for arthralgias.  All other systems reviewed and are negative.  Blood pressure 122/80, pulse (!) 103, temperature 97.9 F (36.6 C), temperature source Oral, resp. rate 16, height  5' (1.524 m), weight 70.6 kg, last menstrual period 08/25/2020, SpO2 100 %. Physical Exam Vitals reviewed.  HENT:     Head: Normocephalic.     Nose: Nose normal.     Mouth/Throat:     Mouth: Mucous membranes are moist.  Eyes:     Pupils: Pupils are equal, round, and reactive to light.  Cardiovascular:     Rate and Rhythm: Normal rate.     Pulses: Normal pulses.  Pulmonary:     Effort: Pulmonary effort is normal.  Abdominal:     General: Abdomen is flat.  Musculoskeletal:     Cervical back: Normal range of motion.  Skin:    General: Skin is warm.     Capillary Refill: Capillary refill takes less than 2 seconds.  Neurological:     General: No focal deficit present.     Mental Status: She is alert.  Psychiatric:        Mood and Affect: Mood normal.  Examination of the left knee demonstrates  palpable pedal pulses with intact ankle dorsiflexion.  Patient cannot really extend the knee beyond 15 degrees from full extension due to her medial meniscal tear which is locked.  Collaterals feel stable.  PCL intact.  No posterior lateral rotatory instability is present.  Extensor mechanism is intact.  No calf tenderness with negative Homans.  Assessment/Plan  Impression is left knee ACL tear with locked medial meniscal tear and lack of full extension by about 20 degrees.  Patient has prior history of patellar dislocation as well.  MPFL looks intact on scan.  Plan at this time is ACL reconstruction using hamstring autograft.  Based on her stature I think it would be difficult to get a robust graft from her quad without taking most of the quad tendon.  This is confirmed on measurements of the quad on the MRI scan.  Inside-out meniscal repair also discussed.  We will assess MPFL at the time of surgery but do not necessarily anticipate intervention for that.  Based on the structural integrity of the MPFL on imaging studies if intervention is required for patellar instability I would favor imbrication over reconstruction.  Risk and benefits of surgery are discussed including not limited to infection nerve vessel damage knee stiffness as well as 30 to 35% chance that the meniscus repair may not heal.  Plan to use CPM machine after surgery.  Considered bone patella tendon bone graft but she has a slight abrasion in the region of the incision for that BTB.  I do not think this is a disqualify her from surgery next week but I am hesitant to put an incision around this region.  In addition a 9 mm bone patellar tendon bone graft would potentially take possibly one third or more of her patellar tendon which makes hamstring tendon with internal brace her best option.  Patient understands the risk and benefits and wishes to proceed.  Parents here for discussion of treatment plan as well.  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  Burnard Bunting, MD 10/05/2020, 7:00 AM

## 2020-10-05 NOTE — Progress Notes (Signed)
Assisted Dr. Finucane with left, ultrasound guided, femoral block. Side rails up, monitors on throughout procedure. See vital signs in flow sheet. Tolerated Procedure well. 

## 2020-10-05 NOTE — Discharge Instructions (Addendum)
No Tylenol before 6:15pm or Ibuprofen before 6:30pm tonight.   Post Anesthesia Home Care Instructions  Activity: Get plenty of rest for the remainder of the day. A responsible individual must stay with you for 24 hours following the procedure.  For the next 24 hours, DO NOT: -Drive a car -Advertising copywriter -Drink alcoholic beverages -Take any medication unless instructed by your physician -Make any legal decisions or sign important papers.  Meals: Start with liquid foods such as gelatin or soup. Progress to regular foods as tolerated. Avoid greasy, spicy, heavy foods. If nausea and/or vomiting occur, drink only clear liquids until the nausea and/or vomiting subsides. Call your physician if vomiting continues.  Special Instructions/Symptoms: Your throat may feel dry or sore from the anesthesia or the breathing tube placed in your throat during surgery. If this causes discomfort, gargle with warm salt water. The discomfort should disappear within 24 hours.  If you had a scopolamine patch placed behind your ear for the management of post- operative nausea and/or vomiting:  1. The medication in the patch is effective for 72 hours, after which it should be removed.  Wrap patch in a tissue and discard in the trash. Wash hands thoroughly with soap and water. 2. You may remove the patch earlier than 72 hours if you experience unpleasant side effects which may include dry mouth, dizziness or visual disturbances. 3. Avoid touching the patch. Wash your hands with soap and water after contact with the patch.    Regional Anesthesia Blocks  1. Numbness or the inability to move the "blocked" extremity may last from 3-48 hours after placement. The length of time depends on the medication injected and your individual response to the medication. If the numbness is not going away after 48 hours, call your surgeon.  2. The extremity that is blocked will need to be protected until the numbness is gone and  the  Strength has returned. Because you cannot feel it, you will need to take extra care to avoid injury. Because it may be weak, you may have difficulty moving it or using it. You may not know what position it is in without looking at it while the block is in effect.  3. For blocks in the legs and feet, returning to weight bearing and walking needs to be done carefully. You will need to wait until the numbness is entirely gone and the strength has returned. You should be able to move your leg and foot normally before you try and bear weight or walk. You will need someone to be with you when you first try to ensure you do not fall and possibly risk injury.  4. Bruising and tenderness at the needle site are common side effects and will resolve in a few days.  5. Persistent numbness or new problems with movement should be communicated to the surgeon or the [facility] [phone] [facility] [phone]).

## 2020-10-05 NOTE — Anesthesia Postprocedure Evaluation (Signed)
Anesthesia Post Note  Patient: [name]  Procedure(s) Performed: LEFT KNEE ANTERIOR CRUCIATE LIGAMENT (ACL) RECONSTRUCTION WITH HAMSTRING AUTOGRAFT (Left: Knee) MEDIAL MENISCAL REPAIR (Left: Knee)     Patient location during evaluation: PACU Anesthesia Type: Regional and General Level of consciousness: awake and alert, oriented and patient cooperative Pain management: pain level controlled Vital Signs Assessment: post-procedure vital signs reviewed and stable Respiratory status: spontaneous breathing, nonlabored ventilation and respiratory function stable Cardiovascular status: blood pressure returned to baseline and stable Postop Assessment: no apparent nausea or vomiting Anesthetic complications: no   No notable events documented.  Last Vitals:  Vitals:   10/05/20 1400 10/05/20 1415  BP: 114/64 109/64  Pulse: (!) 103 100  Resp: 18 17  Temp:    SpO2: 94% 93%    Last Pain:  Vitals:   10/05/20 1445  TempSrc:   PainSc: 5                  Lannie Fields

## 2020-10-05 NOTE — Op Note (Signed)
NAME: Kathryn Anderson, Kathryn Anderson MEDICAL RECORD NO: [id number] ACCOUNT NO: [id number] DATE OF BIRTH: 03/28/2000 FACILITY: [facility] LOCATION: [facility] PHYSICIAN: Graylin Shiver. August Saucer, MD  Operative Report   DATE OF PROCEDURE: 10/05/2020  PREOPERATIVE DIAGNOSIS:  Left knee anterior cruciate ligament tear with bucket-handle tear of the medial meniscus involving essentially the entire meniscus posterior two-thirds circumference as well as tear of the lateral meniscus from the meniscal  attachment posteriorly to just anterior to the popliteal hiatus.  Medial meniscal tear in the red-red zone, lateral meniscal tear in the red-white zone.  POSTOPERATIVE DIAGNOSIS:  Left knee anterior cruciate ligament tear with bucket-handle tear of the medial meniscus involving essentially the entire meniscus posterior two-thirds circumference as well as tear of the lateral meniscus from the meniscal  attachment posteriorly to just anterior to the popliteal hiatus.  Medial meniscal tear in the red-red zone, lateral meniscal tear in the red-white zone.  PROCEDURE PERFORMED:  Left knee anterior cruciate ligament reconstruction using hamstring autograft with Arthrex dual Endobutton technique fixation with inside out medial meniscal repair and all-inside lateral meniscal repair using Arthrex all-inside  device.  SURGEON:  Graylin Shiver. August Saucer, MD  ASSISTANT:  Karenann Cai, PA  INDICATIONS:  The patient is a 20 year old patient who has a locked medial meniscal tear.  She also has ACL deficiency following injury about 10 days ago, presents now for operative management after explanation of risks and benefits.  DESCRIPTION OF PROCEDURE:  The patient was brought to the operating room where general anesthetic was induced.  Preoperative antibiotics were administered.  Timeout was called.  Left knee was examined under anesthesia and found to lack 15 degrees of full  extension.  The ACL was out.  The PCL was stable.  No posterolateral rotatory  instability was noted.  Collaterals were stable to varus and valgus stress at 0 and 30 degrees.  Left leg was then pre-scrubbed with alcohol and Betadine allowed to air dry.   Prepped with DuraPrep solution and draped in a sterile manner.  Ioban used to cover the operative field.  A timeout was called.  Portals were anesthetized using about 5 mL each of Marcaine with epinephrine.  Incision was then made over the pes bursa  tendons.  Skin and subcutaneous tissue sharply divided. The pes bursa tendons were identified.  The semitendinosis was harvested with care being taken to avoid injury to the saphenous nerve.  The gracilis was also harvested and prepared on the back table  using dual Endobutton technique.  Graft size was 8 in the femur, 9.5 in the tibia.  Vancomycin sponge was used to soak the graft upon completion.  Irrigation was performed.  Moist sponge placed within the harvest site.  Attention was then directed  towards the knee itself.  Anterior inferolateral portal was established, anterior inferomedial portal was established under direct visualization.  Diagnostic arthroscopy was performed.  The patient had a locked medial meniscal tear.  It was actually  somewhat difficult to reduce the tear, but it was reduced with the probe, and using valgus stress along with extension.  ACL was torn.  Synovitis was present within the knee joint.  The ACL stump was debrided.  Notchplasty performed.  Lateral compartment  was inspected and found to have a tear through the red-white zone extending from the meniscal attachment site posteriorly to just anterior to the popliteus.  This was primarily an undersurface tear.  This tear was rasped.  The medial side was also  rasped.  Following notchplasty attention was then  directed towards the medial side.  An incision was made just at the joint line and slightly inferior.  Tourniquet was inflated for this portion of the case.  Skin and subcutaneous tissue were sharply   divided.  Care was taken to avoid injury to the saphenous nerve and its branches as well as the saphenous vein.  The fascia overlying the capsule was incised and blunt dissection performed.  Overall, a good view of the capsule was achieved.  Next, using  Arthrex inside-out suture passing device, 2-0 FiberWire sutures were placed in alternating vertical mattress, horizontal mattress fashion.  All in all 7 sutures were passed back to about 1.5 to 2 cm from the posterior horn of the medial meniscus.  The  sutures were then tied posterior to anterior with the knee in about 30 degrees of flexion.  Secure repair was achieved.  Next, all-inside technique was utilized with Arthrex suture anchors x2 in the lateral meniscal tear.  This gave a very stable repair  and this was cinched down with the knee in about 60 degrees of flexion and about 70 degrees of flexion.  Next, attention was directed towards the ACL.  The tunnel was drilled 8 mm on the femoral side in the 3 o'clock position and then in the posterior  aspect of the native ACL footprint on the tibial side.  A graft was then passed with bone tunnels grafted.  We did have to complete the tunnel on the tibial side in order to seat the graft.  This required slightly larger Endobutton manhole cover type  device.  Fluoroscopy was used to confirm good placement of the Endobutton on the femur.  Next, a graft was placed and tightened on the tibial side, tied over a button reinforced with a SwiveLock and extension.  A very good stability was achieved.  Next,  the tourniquet was released.  Thorough irrigation was performed on all incisions.  Joint was irrigated.  Next, the portal incisions were closed using 2-0 Vicryl, 3-0 nylon.  The harvest site was closed using 0 Vicryl suture, 2-0 Vicryl suture, and a 3-0  Monocryl.  Steri-Strips applied.  Medial incision site closed using 0 Vicryl suture, 2-0 Vicryl suture, and a 3-0 Monocryl with Steri-Strips.  The lateral site  was closed using 0 Vicryl suture, closed the iliotibial band, followed by 2-0 Vicryl and 3-0  nylon.  All incisions were covered with impervious dressings.  Solution of Marcaine, morphine, clonidine injected into the knee for postoperative pain relief.  A bulky dressing was applied along with an iceman and a knee immobilizer.  She will be  nonweightbearing for [redacted] weeks along with aspirin for DVT prophylaxis.  Okay to start CPM machine tonight.  The patient tolerated the procedure well without immediate complications.  It should be noted that the patella was stable at the conclusion of the  case.  Could not dislocate the patella either in extension or 30 degrees of flexion.  Essentially had symmetric laxity to the right hand side.  The patient tolerated the procedure well without immediate complications.  Luke's assistance was required for  opening, closing, graft placement and graft creation.  His assistance was a medical necessity.   PUS D: 10/05/2020 1:09:01 pm T: 10/05/2020 2:00:00 pm  JOB: [id number]/ [id number]

## 2020-10-05 NOTE — Anesthesia Procedure Notes (Addendum)
Anesthesia Regional Block: Femoral nerve block   Pre-Anesthetic Checklist: , timeout performed,  Correct Patient, Correct Site, Correct Laterality,  Correct Procedure, Correct Position, site marked,  Risks and benefits discussed,  Surgical consent,  Pre-op evaluation,  At surgeon's request and post-op pain management  Laterality: Left  Prep: Maximum Sterile Barrier Precautions used, chloraprep       Needles:  Injection technique: Single-shot  Needle Type: Echogenic Stimulator Needle     Needle Length: 9cm  Needle Gauge: 22     Additional Needles:   Procedures:,,,, ultrasound used (permanent image in chart),,    Narrative:  Start time: 10/05/2020 7:00 AM End time: 10/05/2020 7:05 AM Injection made incrementally with aspirations every 5 mL.  Performed by: Personally  Anesthesiologist: Lannie Fields, DO  Additional Notes: Monitors applied. No increased pain on injection. No increased resistance to injection. Injection made in 5cc increments. Good needle visualization. Patient tolerated procedure well.

## 2020-10-06 ENCOUNTER — Other Ambulatory Visit: Payer: Self-pay | Admitting: Orthopedic Surgery

## 2020-10-06 ENCOUNTER — Encounter: Payer: Self-pay | Admitting: Orthopedic Surgery

## 2020-10-06 ENCOUNTER — Other Ambulatory Visit: Payer: Self-pay | Admitting: Family Medicine

## 2020-10-06 ENCOUNTER — Encounter: Payer: Self-pay | Admitting: Family Medicine

## 2020-10-06 MED ORDER — PROMETHAZINE HCL 12.5 MG PO TABS: 6.2500 mg | ORAL_TABLET | Freq: Four times a day (QID) | ORAL | 1 refills | Status: DC | PRN

## 2020-10-06 MED ORDER — PROMETHAZINE HCL 12.5 MG PO TABS: 6.2500 mg | ORAL_TABLET | Freq: Four times a day (QID) | ORAL | 1 refills | Status: AC | PRN

## 2020-10-06 NOTE — Telephone Encounter (Signed)
I called.  She slept okay during the night but has only had 2 pain pills today.  Called in some Phenergan for her and also instructed her to take 2 pain pills every 4 hours.  She is able to dorsiflex and plantarflex her ankle per mother.  I will call her back tomorrow.

## 2020-10-07 ENCOUNTER — Encounter (HOSPITAL_BASED_OUTPATIENT_CLINIC_OR_DEPARTMENT_OTHER): Payer: Self-pay | Admitting: Orthopedic Surgery

## 2020-10-07 ENCOUNTER — Encounter: Payer: Self-pay | Admitting: Orthopedic Surgery

## 2020-10-07 NOTE — Telephone Encounter (Signed)
I called.  She is doing better today.  52 on CPM machine.  She will be here next week with her grandmother.  Plan to send in more pain medicine on Friday.

## 2020-10-07 NOTE — Telephone Encounter (Signed)
Pls call in more pain meds for Friday norco 5/325 1 po qto 2 po q 4 - 6 # 40 pls claal thx on friday

## 2020-10-08 ENCOUNTER — Other Ambulatory Visit: Payer: Self-pay | Admitting: Orthopedic Surgery

## 2020-10-08 ENCOUNTER — Encounter: Payer: Self-pay | Admitting: Orthopedic Surgery

## 2020-10-08 ENCOUNTER — Telehealth: Payer: Self-pay | Admitting: Orthopedic Surgery

## 2020-10-08 MED ORDER — HYDROCODONE-ACETAMINOPHEN 5-325 MG PO TABS: 2.0000 | ORAL_TABLET | ORAL | 0 refills | Status: DC | PRN

## 2020-10-08 NOTE — Telephone Encounter (Signed)
Pt's mother Gavin Pound called asking for her daughter hydrocodone prescription be filled today. Gavin Pound states her medication will run out to night and pt takes it every 4 hours. Please send to pharmacy on file and call when medication had been called in. Phone number is [phone].

## 2020-10-08 NOTE — Telephone Encounter (Signed)
I called.  She is improving.  Moving around little better.  Doing well with the CPM

## 2020-10-08 NOTE — Telephone Encounter (Signed)
Please see message below. Pharmacy has been verified. Patient is s/p Left knee ACL reconstruction with hamstring autograft preformed on 10/05/2020

## 2020-10-09 ENCOUNTER — Encounter: Payer: Self-pay | Admitting: Orthopedic Surgery

## 2020-10-09 NOTE — Telephone Encounter (Signed)
I would start to taper her medication and on Sunday to try to stretch it out.  We can address the refill issue on Monday.  I am not sure about the grandmother picking up the medication.

## 2020-10-11 DIAGNOSIS — S83242A Other tear of medial meniscus, current injury, left knee, initial encounter: Secondary | ICD-10-CM

## 2020-10-11 DIAGNOSIS — S83512A Sprain of anterior cruciate ligament of left knee, initial encounter: Secondary | ICD-10-CM

## 2020-10-11 DIAGNOSIS — S83262A Peripheral tear of lateral meniscus, current injury, left knee, initial encounter: Secondary | ICD-10-CM

## 2020-10-12 ENCOUNTER — Other Ambulatory Visit: Payer: Self-pay | Admitting: Surgical

## 2020-10-12 ENCOUNTER — Telehealth: Payer: Self-pay | Admitting: Orthopedic Surgery

## 2020-10-12 MED ORDER — HYDROCODONE-ACETAMINOPHEN 5-325 MG PO TABS: 1.0000 | ORAL_TABLET | ORAL | 0 refills | Status: DC | PRN

## 2020-10-12 NOTE — Telephone Encounter (Signed)
P mother called requesting refill of pain medication. She would like you ro call and speak with her daughter.  CB [phone]

## 2020-10-14 ENCOUNTER — Ambulatory Visit (INDEPENDENT_AMBULATORY_CARE_PROVIDER_SITE_OTHER): Payer: Commercial Managed Care - PPO | Admitting: Orthopedic Surgery

## 2020-10-14 ENCOUNTER — Ambulatory Visit (HOSPITAL_COMMUNITY)
Admission: RE | Admit: 2020-10-14 | Discharge: 2020-10-14 | Disposition: A | Discharge status: Home / Self Care | Payer: Commercial Managed Care - PPO | Source: Ambulatory Visit | Attending: Orthopedic Surgery | Admitting: Orthopedic Surgery

## 2020-10-14 ENCOUNTER — Other Ambulatory Visit: Payer: Self-pay

## 2020-10-14 DIAGNOSIS — Z9889 Other specified postprocedural states: Secondary | ICD-10-CM | POA: Diagnosis present

## 2020-10-14 MED ORDER — METHOCARBAMOL 500 MG PO TABS: 500.0000 mg | ORAL_TABLET | Freq: Three times a day (TID) | ORAL | 0 refills | Status: AC

## 2020-10-14 MED ORDER — DOCUSATE SODIUM 100 MG PO CAPS: 100.0000 mg | ORAL_CAPSULE | Freq: Two times a day (BID) | ORAL | 0 refills | Status: AC | PRN

## 2020-10-14 NOTE — Telephone Encounter (Signed)
We saw her today.

## 2020-10-14 NOTE — Telephone Encounter (Signed)
I think Dr. August Saucer called her the other day but I will double check

## 2020-10-14 NOTE — Progress Notes (Signed)
Lower extremity venous has been completed.   Preliminary results in CV Proc.   Blanch Media 10/14/2020 3:31 PM

## 2020-10-15 ENCOUNTER — Telehealth: Payer: Self-pay | Admitting: Orthopedic Surgery

## 2020-10-15 NOTE — Telephone Encounter (Signed)
Pt's mother Gavin Pound called and left a message on triage about pt having blood in her stool. Pt's mother is asking for a call back to make sure it's ok for pt to take pain medication. It is time for pt to take medication and mother need a answer as soon as possible. Please call at [phone].

## 2020-10-16 ENCOUNTER — Encounter: Payer: Self-pay | Admitting: Orthopedic Surgery

## 2020-10-16 NOTE — Telephone Encounter (Signed)
Called and discussed

## 2020-10-18 ENCOUNTER — Encounter: Payer: Self-pay | Admitting: Orthopedic Surgery

## 2020-10-18 NOTE — Progress Notes (Signed)
Post-Op Visit Note   Patient: Kathryn Anderson           Date of Birth: 2000-04-02           MRN: [id number] Visit Date: 10/14/2020 PCP: Eunice Blase, MD   Assessment & Plan:  Chief Complaint:  Chief Complaint  Patient presents with   Left Knee - Routine Post Op    10/05/20 left knee ACL recon MMR   Visit Diagnoses:  1. S/P ACL reconstruction     Plan: Kathryn Anderson is a 20 year old patient is a week out left knee ACL reconstruction with bilateral meniscal repair.  She has been doing well and improving.  She is on aspirin for DVT prophylaxis.  At the time of this dictation she was having some calf pain but ultrasound was negative for DVT.  She has about 80 degrees of flexion and full extension.  Graft is stable.  Ankle dorsiflexion intact with palpable pedal pulses.  Ace wrap applied.  7-day return for clinical recheck on range of motion.  Rena start weaning her Norco and take ibuprofen in between.  Robaxin and Colace refilled.  Come back in 7 days for clinical recheck on range of motion which we really do not want going past 90 degrees as well as quad strength.  Follow-Up Instructions: Return in about 1 week (around 10/21/2020).   Orders:  Orders Placed This Encounter  Procedures   VAS [place] LOWER EXTREMITY VENOUS (DVT)   Meds ordered this encounter  Medications   docusate sodium (COLACE) 100 MG capsule    Sig: Take 1 capsule (100 mg total) by mouth 2 (two) times daily as needed for mild constipation.    Dispense:  40 capsule    Refill:  0   methocarbamol (ROBAXIN) 500 MG tablet    Sig: Take 1 tablet (500 mg total) by mouth 3 (three) times daily.    Dispense:  30 tablet    Refill:  0    Imaging: No results found.  PMFS History: Patient Active Problem List   Diagnosis Date Noted   Left anterior cruciate ligament tear    Peripheral tear of lateral meniscus of left knee as current injury    Acute medial meniscus tear of left knee    Fever 04/18/2020   Headache 04/18/2020    Vancomycin adverse reaction, initial encounter 04/16/2020   Chronic migraine without aura without status migrainosus, not intractable 01/28/2020   Chronic pain of both knees 01/28/2020   Ankle ligament laxity, left 12/19/2019   Past Medical History:  Diagnosis Date   Headache     Family History  Problem Relation Age of Onset   Healthy Mother    Healthy Father    Macular degeneration Maternal Grandmother    Glaucoma Maternal Grandmother    Dementia Paternal Grandfather    Heart disease Neg Hx     Past Surgical History:  Procedure Laterality Date   ANTERIOR CRUCIATE LIGAMENT REPAIR Left 10/05/2020   Procedure: LEFT KNEE ANTERIOR CRUCIATE LIGAMENT (ACL) RECONSTRUCTION WITH HAMSTRING AUTOGRAFT;  Surgeon: Meredith Pel, MD;  Location: [place];  Service: Orthopedics;  Laterality: Left;   KNEE ARTHROSCOPY WITH MENISCAL REPAIR Left 10/05/2020   Procedure: MEDIAL MENISCAL REPAIR;  Surgeon: Meredith Pel, MD;  Location: [place];  Service: Orthopedics;  Laterality: Left;   WISDOM TOOTH EXTRACTION     Social History   Occupational History   Not on file  Tobacco Use   Smoking status: Never  Smokeless tobacco: Never  Vaping Use   Vaping Use: Never used  Substance and Sexual Activity   Alcohol use: Never   Drug use: Never   Sexual activity: Not on file

## 2020-10-21 ENCOUNTER — Ambulatory Visit (INDEPENDENT_AMBULATORY_CARE_PROVIDER_SITE_OTHER): Payer: Commercial Managed Care - PPO | Admitting: Orthopedic Surgery

## 2020-10-21 ENCOUNTER — Other Ambulatory Visit: Payer: Self-pay

## 2020-10-21 DIAGNOSIS — Z9889 Other specified postprocedural states: Secondary | ICD-10-CM

## 2020-10-21 MED ORDER — HYDROCODONE-ACETAMINOPHEN 5-325 MG PO TABS: ORAL_TABLET | ORAL | 0 refills | Status: AC

## 2020-10-23 ENCOUNTER — Encounter: Payer: Self-pay | Admitting: Orthopedic Surgery

## 2020-10-23 NOTE — Progress Notes (Signed)
Post-Op Visit Note   Patient: Kathryn Anderson           Date of Birth: [birth year]           MRN: [id number] Visit Date: 10/21/2020 PCP: Eunice Blase, MD   Assessment & Plan:  Chief Complaint:  Chief Complaint  Patient presents with   Left Knee - Routine Post Op    10/05/20 left knee ACL recon MMR     Visit Diagnoses:  1. S/P ACL reconstruction     Plan: Patient is a 20 year old female who presents s/p left knee anterior cruciate ligament reconstruction with medial lateral meniscal repairs on 10/05/2020.  She is still ambulating nonweightbearing.  She is in knee immobilizer.  She is on CPM machine at 90 degrees.  She denies any chest pain, shortness of breath, calf pain.  She did have episode of bright red blood per rectum that has now resolved after stopping the aspirin.  Denies any abdominal pain, nausea/vomiting, melena.  She had ultrasound that was negative for DVT previously and has no calf tenderness on exam today.  On examination she has 0 degrees extension and about 90 degrees of knee flexion which is excellent.  She is able to perform straight leg raise without extensor lag.  No knee effusion.  Incisions are healing well without any evidence of infection or dehiscence.  She has reduced her pain medication dose to about 1 every 12 hours and is continuing to work on weaning off of the medication completely.  Recommended she return to taking the aspirin as she is still nonweightbearing and if she begins to have rectal bleeding again, she would just discontinue it altogether.  She will follow-up with Dr. Marlou Sa in 2 weeks for clinical recheck and likely initiation of physical therapy and weightbearing at that time.  She has excellent quadricep strength which should bode well for her eventually returning to weightbearing rather quickly.  Follow-up in 2 weeks.  Follow-Up Instructions: No follow-ups on file.   Orders:  No orders of the defined types were placed in this  encounter.  Meds ordered this encounter  Medications   HYDROcodone-acetaminophen (NORCO/VICODIN) 5-325 MG tablet    Sig: Take every 12 to 24 hours as needed for severe pain.    Dispense:  6 tablet    Refill:  0    Imaging: No results found.  PMFS History: Patient Active Problem List   Diagnosis Date Noted   Left anterior cruciate ligament tear    Peripheral tear of lateral meniscus of left knee as current injury    Acute medial meniscus tear of left knee    Fever 04/18/2020   Headache 04/18/2020   Vancomycin adverse reaction, initial encounter 04/16/2020   Chronic migraine without aura without status migrainosus, not intractable 01/28/2020   Chronic pain of both knees 01/28/2020   Ankle ligament laxity, left 12/19/2019   Past Medical History:  Diagnosis Date   Headache     Family History  Problem Relation Age of Onset   Healthy Mother    Healthy Father    Macular degeneration Maternal Grandmother    Glaucoma Maternal Grandmother    Dementia Paternal Grandfather    Heart disease Neg Hx     Past Surgical History:  Procedure Laterality Date   ANTERIOR CRUCIATE LIGAMENT REPAIR Left 10/05/2020   Procedure: LEFT KNEE ANTERIOR CRUCIATE LIGAMENT (ACL) RECONSTRUCTION WITH HAMSTRING AUTOGRAFT;  Surgeon: Meredith Pel, MD;  Location: [place];  Service: Orthopedics;  Laterality:  Left;   KNEE ARTHROSCOPY WITH MENISCAL REPAIR Left 10/05/2020   Procedure: MEDIAL MENISCAL REPAIR;  Surgeon: Meredith Pel, MD;  Location: [place];  Service: Orthopedics;  Laterality: Left;   WISDOM TOOTH EXTRACTION     Social History   Occupational History   Not on file  Tobacco Use   Smoking status: Never   Smokeless tobacco: Never  Vaping Use   Vaping Use: Never used  Substance and Sexual Activity   Alcohol use: Never   Drug use: Never   Sexual activity: Not on file

## 2020-10-27 ENCOUNTER — Encounter: Payer: Self-pay | Admitting: Orthopedic Surgery

## 2020-10-27 NOTE — Telephone Encounter (Signed)
Need to wait it out nothing really to do thx

## 2020-10-27 NOTE — Telephone Encounter (Signed)
Ok to dc if bending to 90 thx

## 2020-10-28 NOTE — Telephone Encounter (Signed)
I am happy to write another prescription for the CPM machine to keep her bending till 90.  Insurance will probably not cover.  If she wants to defer the cost of the machine and bend it on her own to 57 that is another option.

## 2020-11-02 ENCOUNTER — Encounter: Payer: Self-pay | Admitting: Orthopedic Surgery

## 2020-11-04 ENCOUNTER — Other Ambulatory Visit: Payer: Self-pay

## 2020-11-04 ENCOUNTER — Encounter: Payer: Self-pay | Admitting: Orthopedic Surgery

## 2020-11-04 ENCOUNTER — Ambulatory Visit (INDEPENDENT_AMBULATORY_CARE_PROVIDER_SITE_OTHER): Payer: Commercial Managed Care - PPO | Admitting: Orthopedic Surgery

## 2020-11-04 DIAGNOSIS — Z9889 Other specified postprocedural states: Secondary | ICD-10-CM

## 2020-11-04 NOTE — Progress Notes (Signed)
   Post-Op Visit Note   Patient: Kathryn Anderson           Date of Birth: Aug 27, 2000           MRN: [id number] Visit Date: 11/04/2020 PCP: Lavada Mesi, MD   Assessment & Plan:  Chief Complaint:  Chief Complaint  Patient presents with   Left Knee - Post-op Follow-up   Visit Diagnoses:  1. S/P ACL reconstruction     Plan: Patient presents now 4 weeks out left knee ACL reconstruction with medial meniscal repair performed 10/05/2020.  On exam she has some skin blotching which does not appear to be infectious.  Could be resorbing blood.  Has full extension and flexion cold today to about 80.  Graft stability is excellent.  No calf tenderness negative Homans.  Plan is to start physical therapy for flexion past 90 but no weighted flexion past 90.  She can easily do straight leg raises at this time so weightbearing as tolerated initially with 2 crutches then 1 crutch to no crutch.  Discontinue knee immobilizer.  Ibuprofen 400 a day just to try to diminish some inflammation.  Continue CPM machine until early next week and then discontinue CPM machine.  Follow-up in 2 weeks for check on range of motion to make sure she achieves flexion to 90 easily.  Follow-Up Instructions: No follow-ups on file.   Orders:  Orders Placed This Encounter  Procedures   Ambulatory referral to Physical Therapy   No orders of the defined types were placed in this encounter.   Imaging: No results found.  PMFS History: Patient Active Problem List   Diagnosis Date Noted   Left anterior cruciate ligament tear    Peripheral tear of lateral meniscus of left knee as current injury    Acute medial meniscus tear of left knee    Fever 04/18/2020   Headache 04/18/2020   Vancomycin adverse reaction, initial encounter 04/16/2020   Chronic migraine without aura without status migrainosus, not intractable 01/28/2020   Chronic pain of both knees 01/28/2020   Ankle ligament laxity, left 12/19/2019   Past Medical  History:  Diagnosis Date   Headache     Family History  Problem Relation Age of Onset   Healthy Mother    Healthy Father    Macular degeneration Maternal Grandmother    Glaucoma Maternal Grandmother    Dementia Paternal Grandfather    Heart disease Neg Hx     Past Surgical History:  Procedure Laterality Date   ANTERIOR CRUCIATE LIGAMENT REPAIR Left 10/05/2020   Procedure: LEFT KNEE ANTERIOR CRUCIATE LIGAMENT (ACL) RECONSTRUCTION WITH HAMSTRING AUTOGRAFT;  Surgeon: Cammy Copa, MD;  Location: [facility];  Service: Orthopedics;  Laterality: Left;   KNEE ARTHROSCOPY WITH MENISCAL REPAIR Left 10/05/2020   Procedure: MEDIAL MENISCAL REPAIR;  Surgeon: Cammy Copa, MD;  Location: [facility];  Service: Orthopedics;  Laterality: Left;   WISDOM TOOTH EXTRACTION     Social History   Occupational History   Not on file  Tobacco Use   Smoking status: Never   Smokeless tobacco: Never  Vaping Use   Vaping Use: Never used  Substance and Sexual Activity   Alcohol use: Never   Drug use: Never   Sexual activity: Not on file

## 2020-11-05 ENCOUNTER — Encounter: Payer: Self-pay | Admitting: Orthopedic Surgery

## 2020-11-06 ENCOUNTER — Ambulatory Visit (INDEPENDENT_AMBULATORY_CARE_PROVIDER_SITE_OTHER): Payer: Commercial Managed Care - PPO | Admitting: Physical Therapy

## 2020-11-06 ENCOUNTER — Encounter: Payer: Self-pay | Admitting: Physical Therapy

## 2020-11-06 ENCOUNTER — Other Ambulatory Visit: Payer: Self-pay

## 2020-11-06 DIAGNOSIS — M6281 Muscle weakness (generalized): Secondary | ICD-10-CM

## 2020-11-06 DIAGNOSIS — M25562 Pain in left knee: Secondary | ICD-10-CM | POA: Diagnosis not present

## 2020-11-06 DIAGNOSIS — R2689 Other abnormalities of gait and mobility: Secondary | ICD-10-CM

## 2020-11-06 DIAGNOSIS — Z9889 Other specified postprocedural states: Secondary | ICD-10-CM | POA: Diagnosis not present

## 2020-11-06 NOTE — Therapy (Signed)
[facility] [place] [address] [place], [place], [address] Phone: [phone]   Fax:  [phone]  Physical Therapy Evaluation  Patient Details  Name: Pamila Mendibles MRN: [id number] Date of Birth: [birth year] Referring Provider (PT): Rise Paganini   Encounter Date: 11/06/2020   PT End of Session - 11/06/20 1552     Visit Number 1    Number of Visits 12    Date for PT Re-Evaluation 12/18/20    PT Start Time 1400    PT Stop Time 1445    PT Time Calculation (min) 45 min    Activity Tolerance Patient tolerated treatment well    Behavior During Therapy [facility] for tasks assessed/performed             Past Medical History:  Diagnosis Date   Headache     Past Surgical History:  Procedure Laterality Date   ANTERIOR CRUCIATE LIGAMENT REPAIR Left 10/05/2020   Procedure: LEFT KNEE ANTERIOR CRUCIATE LIGAMENT (ACL) RECONSTRUCTION WITH HAMSTRING AUTOGRAFT;  Surgeon: Cammy Copa, MD;  Location: [facility];  Service: Orthopedics;  Laterality: Left;   KNEE ARTHROSCOPY WITH MENISCAL REPAIR Left 10/05/2020   Procedure: MEDIAL MENISCAL REPAIR;  Surgeon: Cammy Copa, MD;  Location: [facility];  Service: Orthopedics;  Laterality: Left;   WISDOM TOOTH EXTRACTION      There were no vitals filed for this visit.    Subjective Assessment - 11/06/20 1407     Subjective Pt had a Left ACL tear and mensical tear after running at volleyball. She is s/p Lt ACL repair with medial meniscus repair on 10/05/2020. Since surgery she has been in CPM 0-90 degrees and has been NWB. She is now WBAT and is allowed to bend past 90 degrees, ROM as tolerated. Pt states she feels "unstable" with wt bearing and is "nervous" since d/c of her brace. 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 Yes    Pain Score 3      Pain Location Knee    Pain Orientation Left    Pain Descriptors / Indicators Aching;Sore    Pain Onset More than a month ago    Aggravating Factors  ROM, wt bearing    Pain Relieving Factors ice    Effect of Pain on Daily Activities unable to work or play volleyball                [facility] PT Assessment - 11/06/20 0001       Assessment   Medical Diagnosis s/p ACL reconstruction    Referring Provider (PT) Rise Paganini    Onset Date/Surgical Date 10/05/20      Precautions   Precaution Comments WBAT, ROM as tolerated, ok to flex past 90      Restrictions   Weight Bearing Restrictions Yes    LLE Weight Bearing Weight bearing as tolerated      Balance Screen   Has the patient fallen in the past 6 months Yes    How many times? 2 - at times of knee popping    Has the patient had a decrease in activity level because of a fear of falling?  No    Is the patient reluctant to leave their home because of a fear of falling?  No      Prior Function  Level of Independence Independent    Vocation Requirements preschool teacher      Observation/Other Assessments   Focus on Therapeutic Outcomes (FOTO)  37 functional status measure      Sensation   Light Touch --   decreased sensation Lt shin     AROM   Right Knee Extension 0    Right Knee Flexion 150    Left Knee Extension -6    Left Knee Flexion 111      Strength   Left Hip Flexion 3+/5    Left Hip Extension 4+/5    Left Hip ABduction 4/5    Left Knee Flexion 4-/5    Left Knee Extension 4-/5      Palpation   Palpation comment decreased VMO activation, quad activation palpable during SLR      Ambulation/Gait   Ambulation/Gait Assistance 6: Modified independent (Device/Increase time)    Assistive device Crutches    Gait Pattern --   pt walks NWB on Lt LE despite MD orders to be WBAT                       Objective measurements completed on examination: See above findings.       [facility] Adult PT  Treatment/Exercise - 11/06/20 0001       Knee/Hip Exercises: Standing   Other Standing Knee Exercises wt shifts laterally and in staggered stance all x 1 min      Knee/Hip Exercises: Supine   Quad Sets 10 reps    Short Arc Quad Sets 10 reps    Bridges 10 reps      Knee/Hip Exercises: Sidelying   Hip ABduction 10 reps                    PT Education - 11/06/20 1442     Education Details PT POC and goals, HEP    Person(s) Educated Patient;Parent(s)    Methods Explanation;Demonstration;Handout    Comprehension Verbalized understanding;Need further instruction              PT Short Term Goals - 09/21/20 1517       PT SHORT TERM GOAL #1   Title Patient will be independent with initial HEP    Status Achieved    Target Date 09/18/20      PT SHORT TERM GOAL #2   Title Patient will be able to elicit good VMO activation with quad set on L knee    Status Achieved    Target Date 09/18/20               PT Long Term Goals - 11/06/20 1556       PT LONG TERM GOAL #1   Title Pt will be independent with HEP    Time 6    Period Weeks    Status New    Target Date 12/18/20      PT LONG TERM GOAL #2   Title Pt will improve FOTO to 67 to demo improved functional mobility    Time 6    Period Weeks    Status New    Target Date 12/18/20      PT LONG TERM GOAL #3   Title Pt will improve Lt LE strength to 4+/5 to return to volleyball with decreasd pain    Time 6    Period Weeks    Status New    Target Date 12/18/20      PT LONG TERM  GOAL #4   Title Pt will perform gait without AD with good technique x 300' to be functional in the community    Time 6    Period Weeks    Status New    Target Date 12/18/20                    Plan - 11/06/20 1552     Clinical Impression Statement Pt is a 20 y/o female s/p Lt ACL reconstruction with medial meniscus repair 10/05/2020. Pt presents with decreased strength and motion, impaired gait and mobility and will  benefit from skilled PT to address deficits and improve functional mobility and independence    Personal Factors and Comorbidities Fitness;Time since onset of injury/illness/exacerbation    Examination-Activity Limitations Locomotion Level;Stand;Stairs;Lift;Squat;Bathing    Examination-Participation Restrictions Community Activity;Occupation;Cleaning;Yard Work    Conservation officer, historic buildings Stable/Uncomplicated    Optometrist Low    Rehab Potential Good    PT Frequency 2x / week    PT Duration 6 weeks    PT Treatment/Interventions Aquatic Therapy;Cryotherapy;Moist Heat;Iontophoresis 4mg /ml Dexamethasone;Electrical Stimulation;Gait training;Stair training;Neuromuscular re-education;Balance training;Therapeutic exercise;Therapeutic activities;Patient/family education;Taping;Dry needling;Manual techniques;Vasopneumatic Device    PT Next Visit Plan assess HEP and progress wt bearing, gait/pregait, progress quad strength    PT Home Exercise Plan [email or web address]    Consulted and Agree with Plan of Care Patient;Family member/caregiver    Family Member Consulted mother             Patient will benefit from skilled therapeutic intervention in order to improve the following deficits and impairments:  Pain, Decreased strength, Decreased activity tolerance, Decreased range of motion, Impaired sensation, Difficulty walking, Decreased balance, Abnormal gait  Visit Diagnosis: S/P ACL reconstruction - Plan: PT plan of care cert/re-cert  Acute pain of left knee - Plan: PT plan of care cert/re-cert  Other abnormalities of gait and mobility - Plan: PT plan of care cert/re-cert  Muscle weakness (generalized) - Plan: PT plan of care cert/re-cert     Problem List Patient Active Problem List   Diagnosis Date Noted   Left anterior cruciate ligament tear    Peripheral tear of lateral meniscus of left knee as current injury    Acute medial meniscus tear of left knee    Fever 04/18/2020    Headache 04/18/2020   Vancomycin adverse reaction, initial encounter 04/16/2020   Chronic migraine without aura without status migrainosus, not intractable 01/28/2020   Chronic pain of both knees 01/28/2020   Ankle ligament laxity, left 12/19/2019   Olof Marcil, PT  Rosha Cocker 11/06/2020, 4:01 PM  [facility] [address] [place], [place], [place] Phone: [phone]   Fax:  [phone]  Name: Lucy Boardman MRN: Erma Pinto Date of Birth: 01/02/2001

## 2020-11-06 NOTE — Patient Instructions (Signed)
Access Code: [email or web address] URL: [email or web address] Date: 11/06/2020 Prepared by: [facility]  Exercises Supine Quad Set - 1 x daily - 7 x weekly - 1 sets - 10 reps - 5 sec hold Supine Knee Extension Strengthening - 1 x daily - 7 x weekly - 3 sets - 10 reps Sidelying Hip Abduction - 1 x daily - 7 x weekly - 3 sets - 10 reps Supine Bridge - 1 x daily - 7 x weekly - 3 sets - 10 reps Side to Side Weight Shift with Counter Support - 1 x daily - 7 x weekly - 3 sets - 10 reps Staggered Stance Forward Backward Weight Shift with Unilateral Counter Support - 1 x daily - 7 x weekly - 3 sets - 10 reps  Patient Education Scar Massage

## 2020-11-10 ENCOUNTER — Ambulatory Visit (INDEPENDENT_AMBULATORY_CARE_PROVIDER_SITE_OTHER): Payer: Commercial Managed Care - PPO

## 2020-11-10 ENCOUNTER — Ambulatory Visit (INDEPENDENT_AMBULATORY_CARE_PROVIDER_SITE_OTHER): Payer: Commercial Managed Care - PPO | Admitting: Physical Therapy

## 2020-11-10 ENCOUNTER — Encounter: Payer: Self-pay | Admitting: Orthopedic Surgery

## 2020-11-10 ENCOUNTER — Other Ambulatory Visit: Payer: Self-pay

## 2020-11-10 DIAGNOSIS — R2689 Other abnormalities of gait and mobility: Secondary | ICD-10-CM | POA: Diagnosis not present

## 2020-11-10 DIAGNOSIS — M25562 Pain in left knee: Secondary | ICD-10-CM

## 2020-11-10 DIAGNOSIS — M6281 Muscle weakness (generalized): Secondary | ICD-10-CM | POA: Diagnosis not present

## 2020-11-10 DIAGNOSIS — Z9889 Other specified postprocedural states: Secondary | ICD-10-CM

## 2020-11-10 DIAGNOSIS — M25662 Stiffness of left knee, not elsewhere classified: Secondary | ICD-10-CM

## 2020-11-10 NOTE — Therapy (Signed)
[facility] [place] [address] [place], [place], [address] Phone: [phone]   Fax:  [phone]  Physical Therapy Treatment  Patient Details  Name: Kathryn Anderson MRN: [id number] Date of Birth: 10-Oct-2000 Referring Provider (PT): Rise Paganini   Encounter Date: 11/10/2020   PT End of Session - 11/10/20 0812     Visit Number 2    Number of Visits 12    Date for PT Re-Evaluation 12/18/20    PT Start Time 0804    PT Stop Time 0845    PT Time Calculation (min) 41 min    Activity Tolerance Patient tolerated treatment well    Behavior During Therapy [facility] for tasks assessed/performed             Past Medical History:  Diagnosis Date   Headache     Past Surgical History:  Procedure Laterality Date   ANTERIOR CRUCIATE LIGAMENT REPAIR Left 10/05/2020   Procedure: LEFT KNEE ANTERIOR CRUCIATE LIGAMENT (ACL) RECONSTRUCTION WITH HAMSTRING AUTOGRAFT;  Surgeon: Cammy Copa, MD;  Location: [facility];  Service: Orthopedics;  Laterality: Left;   KNEE ARTHROSCOPY WITH MENISCAL REPAIR Left 10/05/2020   Procedure: MEDIAL MENISCAL REPAIR;  Surgeon: Cammy Copa, MD;  Location: [facility];  Service: Orthopedics;  Laterality: Left;   WISDOM TOOTH EXTRACTION      There were no vitals filed for this visit.   Subjective Assessment - 11/10/20 0820     Subjective Pt reports she has been using crutches with WBAT for household and community walking.  She is finished using CPM, "it was at 130".  She complains of weakness in LLE.  She reports that she transitioned from the immobilizer to no brace.    Currently in Pain? No/denies    Pain Score 0-No pain                [facility] PT Assessment - 11/10/20 0001       AROM   Left Knee Extension -4    Left Knee Flexion 118              [facility] Adult PT Treatment/Exercise - 11/10/20 0001       Ambulation/Gait   Ambulation/Gait Assistance 6:  Modified independent (Device/Increase time)    Ambulation Distance (Feet) --   40 ft  x 4   Assistive device Crutches    Gait Pattern Step-through pattern    Ambulation Surface Level;Indoor    Stairs Yes    Stair Management Technique With crutches    Number of Stairs 8    Height of Stairs 6   6 steps - 6", 2 steps -8" height   Pre-Gait Activities weight shifts on scale  (side to side, front to back).  short trial of gait with single crutch on Rt with step through pattern - 20 ft.    Gait Comments pt instructed in step to pattern with crutches for stairs; plan to instruct in step through at next visit.      Knee/Hip Exercises: Aerobic   Recumbent Bike slow full revolutions (no resistance) for ROM of Lt knee x 5 min      Knee/Hip Exercises: Supine   Quad Sets Left;1 set;5 reps   10 sec hold   Heel Slides AAROM;Left;1 set;5 reps    Bridges 10 reps    Straight Leg Raises Left;2 sets;10 reps;5 reps   second set in long sitting     Knee/Hip Exercises: Sidelying   Hip  ABduction Strengthening;Left;1 set;10 reps    Hip ADduction Strengthening;Left;1 set;10 reps      Knee/Hip Exercises: Prone   Hip Extension Left;1 set;10 reps    Other Prone Exercises Lt prone quad set with toes tucked x 10 sec x 5 reps                PT Long Term Goals - 11/06/20 1556       PT LONG TERM GOAL #1   Title Pt will be independent with HEP    Time 6    Period Weeks    Status New    Target Date 12/18/20      PT LONG TERM GOAL #2   Title Pt will improve FOTO to 67 to demo improved functional mobility    Time 6    Period Weeks    Status New    Target Date 12/18/20      PT LONG TERM GOAL #3   Title Pt will improve Lt LE strength to 4+/5 to return to volleyball with decreasd pain    Time 6    Period Weeks    Status New    Target Date 12/18/20      PT LONG TERM GOAL #4   Title Pt will perform gait without AD with good technique x 300' to be functional in the community    Time 6    Period  Weeks    Status New    Target Date 12/18/20                   Plan - 11/10/20 0954     Clinical Impression Statement Pt demo gradual improvement in Lt knee ROM. She tolerated all exercises well without any symptoms.  Pt instructed in step-to sequence for stairs with crutches to allow her to get upstairs to bedroom (has been sleeping on air mattress on first floor). Plan for further progression of stairs next visit.  Pt progressing towards LTGs.  Returns to MD next week.    Personal Factors and Comorbidities Fitness;Time since onset of injury/illness/exacerbation    Examination-Activity Limitations Locomotion Level;Stand;Stairs;Lift;Squat;Bathing    Examination-Participation Restrictions Community Activity;Occupation;Cleaning;Yard Work    Stability/Clinical Decision Making Stable/Uncomplicated    Rehab Potential Good    PT Frequency 2x / week    PT Duration 6 weeks    PT Treatment/Interventions Aquatic Therapy;Cryotherapy;Moist Heat;Iontophoresis 4mg /ml Dexamethasone;Electrical Stimulation;Gait training;Stair training;Neuromuscular re-education;Balance training;Therapeutic exercise;Therapeutic activities;Patient/family education;Taping;Dry needling;Manual techniques;Vasopneumatic Device    PT Next Visit Plan gait/stairs, progress quad strength with closed chain exercises.    PT Home Exercise Plan [email or web address]    Consulted and Agree with Plan of Care Patient;Family member/caregiver    Family Member Consulted mother             Patient will benefit from skilled therapeutic intervention in order to improve the following deficits and impairments:  Pain, Decreased strength, Decreased activity tolerance, Decreased range of motion, Impaired sensation, Difficulty walking, Decreased balance, Abnormal gait  Visit Diagnosis: S/P ACL reconstruction  Acute pain of left knee  Other abnormalities of gait and mobility  Muscle weakness (generalized)  Stiffness of left knee, not elsewhere  classified     Problem List Patient Active Problem List   Diagnosis Date Noted   Left anterior cruciate ligament tear    Peripheral tear of lateral meniscus of left knee as current injury    Acute medial meniscus tear of left knee    Fever 04/18/2020   Headache 04/18/2020  Vancomycin adverse reaction, initial encounter 04/16/2020   Chronic migraine without aura without status migrainosus, not intractable 01/28/2020   Chronic pain of both knees 01/28/2020   Ankle ligament laxity, left 12/19/2019   Mayer Camel, PTA 11/10/20 9:58 AM  [facility] [address] [place], [place], [address] Phone: [phone]   Fax:  [phone]  Name: Kassidi Elza MRN: [id number] Date of Birth: 07-06-2000

## 2020-11-10 NOTE — Progress Notes (Signed)
Hinged knee brace size large was given to pt. She walked around in it for awhile and stated it was making her feel more stable. She was advised to call with any concerns.

## 2020-11-12 ENCOUNTER — Encounter: Payer: Self-pay | Admitting: Physical Therapy

## 2020-11-12 ENCOUNTER — Other Ambulatory Visit: Payer: Self-pay

## 2020-11-12 ENCOUNTER — Ambulatory Visit (INDEPENDENT_AMBULATORY_CARE_PROVIDER_SITE_OTHER): Payer: Commercial Managed Care - PPO | Admitting: Physical Therapy

## 2020-11-12 DIAGNOSIS — R2689 Other abnormalities of gait and mobility: Secondary | ICD-10-CM

## 2020-11-12 DIAGNOSIS — M25662 Stiffness of left knee, not elsewhere classified: Secondary | ICD-10-CM

## 2020-11-12 DIAGNOSIS — M6281 Muscle weakness (generalized): Secondary | ICD-10-CM

## 2020-11-12 DIAGNOSIS — M25562 Pain in left knee: Secondary | ICD-10-CM | POA: Diagnosis not present

## 2020-11-12 DIAGNOSIS — Z9889 Other specified postprocedural states: Secondary | ICD-10-CM

## 2020-11-12 NOTE — Therapy (Signed)
[facility] [place] [address] [place], [place], [address] Phone: [phone]   Fax:  [phone]  Physical Therapy Treatment  Patient Details  Name: Kathryn Anderson MRN: [id number] Date of Birth: 09-01-2000 Referring Provider (PT): Rise Paganini   Encounter Date: 11/12/2020   PT End of Session - 11/12/20 1542     Visit Number 3    Number of Visits 12    Date for PT Re-Evaluation 12/18/20    Authorization Type [employer]    PT Start Time 1448    PT Stop Time 1534    PT Time Calculation (min) 46 min    Activity Tolerance Patient tolerated treatment well    Behavior During Therapy [facility] for tasks assessed/performed             Past Medical History:  Diagnosis Date   Headache     Past Surgical History:  Procedure Laterality Date   ANTERIOR CRUCIATE LIGAMENT REPAIR Left 10/05/2020   Procedure: LEFT KNEE ANTERIOR CRUCIATE LIGAMENT (ACL) RECONSTRUCTION WITH HAMSTRING AUTOGRAFT;  Surgeon: Cammy Copa, MD;  Location: [facility];  Service: Orthopedics;  Laterality: Left;   KNEE ARTHROSCOPY WITH MENISCAL REPAIR Left 10/05/2020   Procedure: MEDIAL MENISCAL REPAIR;  Surgeon: Cammy Copa, MD;  Location: [facility];  Service: Orthopedics;  Laterality: Left;   WISDOM TOOTH EXTRACTION      There were no vitals filed for this visit.   Subjective Assessment - 11/12/20 1558     Subjective Pt reports that she has been using 1 crutch at home, and sometimes no crutch.  She received a hinged brace from surgeon's office; per pt, she was told to use it for comfort, but no other guidelines.    Pertinent History history of dislocated knee cap x 2 on Lt    Currently in Pain? Yes    Pain Score 2     Pain Location Knee    Pain Orientation Left;Anterior;Lateral    Pain Descriptors / Indicators Sore    Aggravating Factors  sitting with knees bent    Pain Relieving Factors ice, and elevation                 [facility] PT Assessment - 11/12/20 0001       Assessment   Medical Diagnosis s/p ACL reconstruction    Referring Provider (PT) Rise Paganini    Onset Date/Surgical Date 10/05/20    Hand Dominance Right      AROM   Left Knee Extension -2    Left Knee Flexion 122              [facility] Adult PT Treatment/Exercise - 11/12/20 0001       Self-Care   Self-Care Scar Mobilizations;Other Self-Care Comments    Scar Mobilizations reviewed scar mobilization    Other Self-Care Comments  pt instructed in patella mobilization; pt returned demo with cues.      Knee/Hip Exercises: Stretches   Passive Hamstring Stretch Left;2 reps;Right;1 rep;30 seconds   single leg long sitting   Quad Stretch Left;3 reps;30 seconds   prone with strap   Gastroc Stretch Left;2 reps;20 seconds   runners stretch     Knee/Hip Exercises: Aerobic   Recumbent Bike slow full revolutions (no resistance) for ROM of Lt knee x 5 min      Knee/Hip Exercises: Standing   Lateral Step Up Left;1 set;10 reps;Hand Hold: 2;Step Height: 6"    Forward Step Up Left;1  set;5 reps;Hand Hold: 2;Step Height: 6"   2 reps on 8" step with retro step down   Stairs reciprocal pattern up/down 4-6" steps with bilat rail x 10.      Manual Therapy   Manual Therapy Soft tissue mobilization;Taping    Soft tissue mobilization IASTM to Lt quad to decrease fascial restrictions.    [employer] IT consultant I strips of reg Rock tape applied to [employer] ant/med/lateral knee incisions to assist with edema reduction and scar management.                     PT Education - 11/12/20 1541     Education Details updated HEP. patellar mobilization.  reviewed rehab restrictions.    Person(s) Educated Patient;Parent(s)    Methods Explanation;Handout;Verbal cues;Demonstration    Comprehension Verbalized understanding;Returned demonstration              PT Short Term Goals - 09/21/20 1517       PT  SHORT TERM GOAL #1   Title Patient will be independent with initial HEP    Status Achieved    Target Date 09/18/20      PT SHORT TERM GOAL #2   Title Patient will be able to elicit good VMO activation with quad set on L knee    Status Achieved    Target Date 09/18/20               PT Long Term Goals - 11/06/20 1556       PT LONG TERM GOAL #1   Title Pt will be independent with HEP    Time 6    Period Weeks    Status New    Target Date 12/18/20      PT LONG TERM GOAL #2   Title Pt will improve FOTO to 67 to demo improved functional mobility    Time 6    Period Weeks    Status New    Target Date 12/18/20      PT LONG TERM GOAL #3   Title Pt will improve Lt LE strength to 4+/5 to return to volleyball with decreasd pain    Time 6    Period Weeks    Status New    Target Date 12/18/20      PT LONG TERM GOAL #4   Title Pt will perform gait without AD with good technique x 300' to be functional in the community    Time 6    Period Weeks    Status New    Target Date 12/18/20                   Plan - 11/12/20 1543     Clinical Impression Statement Pt demonstrating improved WB through LLE; able to ambulate short distances without AD, but with gait deviations.  Encouraged pt to utilize single crutch for community distance and stairs.  Pt's Lt knee initially with clicking upon descending stairs; improved with time and relaxing quad during motion.  Tolerated 4-8" steps well.  Lt knee ROM improving each visit.  Progressing towards LTGs.    Personal Factors and Comorbidities Fitness;Time since onset of injury/illness/exacerbation    Examination-Activity Limitations Locomotion Level;Stand;Stairs;Lift;Squat;Bathing    Examination-Participation Restrictions Community Activity;Occupation;Cleaning;Yard Work    Stability/Clinical Decision Making Stable/Uncomplicated    Rehab Potential Good    PT Frequency 2x / week    PT Duration 6 weeks  PT Treatment/Interventions  Aquatic Therapy;Cryotherapy;Moist Heat;Iontophoresis 4mg /ml Dexamethasone;Electrical Stimulation;Gait training;Stair training;Neuromuscular re-education;Balance training;Therapeutic exercise;Therapeutic activities;Patient/family education;Taping;Dry needling;Manual techniques;Vasopneumatic Device    PT Next Visit Plan gait/stairs, progress quad strength with closed chain exercises.    PT Home Exercise Plan [email or web address]    Consulted and Agree with Plan of Care Patient;Family member/caregiver    Family Member Consulted mother             Patient will benefit from skilled therapeutic intervention in order to improve the following deficits and impairments:  Pain, Decreased strength, Decreased activity tolerance, Decreased range of motion, Impaired sensation, Difficulty walking, Decreased balance, Abnormal gait  Visit Diagnosis: S/P ACL reconstruction  Acute pain of left knee  Other abnormalities of gait and mobility  Muscle weakness (generalized)  Stiffness of left knee, not elsewhere classified     Problem List Patient Active Problem List   Diagnosis Date Noted   Left anterior cruciate ligament tear    Peripheral tear of lateral meniscus of left knee as current injury    Acute medial meniscus tear of left knee    Fever 04/18/2020   Headache 04/18/2020   Vancomycin adverse reaction, initial encounter 04/16/2020   Chronic migraine without aura without status migrainosus, not intractable 01/28/2020   Chronic pain of both knees 01/28/2020   Ankle ligament laxity, left 12/19/2019    12/21/2019, PTA 11/12/20 4:04 PM   [facility] [place] [address] [place], [place], [place] Phone: [phone]   Fax:  [phone]  Name: Miski Feldpausch MRN: Erma Pinto Date of Birth: 2000/04/12

## 2020-11-12 NOTE — Patient Instructions (Signed)
Access Code: [email or web address] URL: [email or web address] Date: 11/12/2020 Prepared by: [facility]  Exercises Prone Quadriceps Set - 2 x daily - 7 x weekly - 1 sets - 10 reps - 10 hold Straight Leg Raise with Arm Support - 1 x daily - 7 x weekly - 2-3 sets - 10 reps Sidelying Hip Adduction - 1 x daily - 7 x weekly - 2-3 sets - 10 reps Sidelying Hip Abduction - 1 x daily - 7 x weekly - 2-3 sets - 10 reps Supine Bridge - 1 x daily - 7 x weekly - 2 sets - 10 reps - 5 hold Prone Alternating Arm and Leg Lifts - 1 x daily - 7 x weekly - 2 sets - 10 reps Seated Table Hamstring Stretch - 2 x daily - 7 x weekly - 3 sets - 2-3 reps - 30 seconds hold Prone Quadriceps Stretch with Strap - 2 x daily - 7 x weekly - 1 sets - 2-3 reps - 30 seconds hold

## 2020-11-16 ENCOUNTER — Ambulatory Visit (INDEPENDENT_AMBULATORY_CARE_PROVIDER_SITE_OTHER): Payer: Commercial Managed Care - PPO | Admitting: Physical Therapy

## 2020-11-16 ENCOUNTER — Other Ambulatory Visit: Payer: Self-pay

## 2020-11-16 DIAGNOSIS — M6281 Muscle weakness (generalized): Secondary | ICD-10-CM

## 2020-11-16 DIAGNOSIS — Z9889 Other specified postprocedural states: Secondary | ICD-10-CM | POA: Diagnosis not present

## 2020-11-16 DIAGNOSIS — R2689 Other abnormalities of gait and mobility: Secondary | ICD-10-CM

## 2020-11-16 DIAGNOSIS — M25562 Pain in left knee: Secondary | ICD-10-CM

## 2020-11-16 DIAGNOSIS — M25662 Stiffness of left knee, not elsewhere classified: Secondary | ICD-10-CM

## 2020-11-16 NOTE — Therapy (Signed)
[facility] [address] [place], [place], [address] Phone: [phone]   Fax:  [phone]  Physical Therapy Treatment  Patient Details  Name: Kathryn Anderson MRN: [id number] Date of Birth: 10-02-2000 Referring Provider (PT): Rise Paganini   Encounter Date: 11/16/2020   PT End of Session - 11/16/20 1033     Visit Number 4    Number of Visits 12    Date for PT Re-Evaluation 12/18/20    Authorization Type [employer] & [employer]    PT Start Time 1020    PT Stop Time 1115    PT Time Calculation (min) 55 min    Activity Tolerance Patient tolerated treatment well    Behavior During Therapy [facility] for tasks assessed/performed             Past Medical History:  Diagnosis Date   Headache     Past Surgical History:  Procedure Laterality Date   ANTERIOR CRUCIATE LIGAMENT REPAIR Left 10/05/2020   Procedure: LEFT KNEE ANTERIOR CRUCIATE LIGAMENT (ACL) RECONSTRUCTION WITH HAMSTRING AUTOGRAFT;  Surgeon: Cammy Copa, MD;  Location: [facility];  Service: Orthopedics;  Laterality: Left;   KNEE ARTHROSCOPY WITH MENISCAL REPAIR Left 10/05/2020   Procedure: MEDIAL MENISCAL REPAIR;  Surgeon: Cammy Copa, MD;  Location: [facility];  Service: Orthopedics;  Laterality: Left;   WISDOM TOOTH EXTRACTION      There were no vitals filed for this visit.   Subjective Assessment - 11/16/20 1041     Subjective Pt reports that she is not using crutches at home, but in community occasionally using one if tired. She complains of Lt knee popping when ascending/ descending stairs.    Pertinent History history of dislocated knee cap x 2 on Lt    Currently in Pain? No/denies    Pain Score 0-No pain                [facility] PT Assessment - 11/16/20 0001       AROM   Left Knee Extension 0    Left Knee Flexion 128      Flexibility   Soft Tissue Assessment /Muscle Length yes    Quadriceps Lt - 122               [facility] Adult PT Treatment/Exercise - 11/16/20 0001       Ambulation/Gait   Ambulation Distance (Feet) 400 Feet   80 ft laps.   Assistive device None    Gait Pattern Step-through pattern;Decreased stance time - left;Decreased dorsiflexion - left    Ambulation Surface Level;Indoor    Gait velocity slowed.    Gait Comments cues for increased Lt DF, even wt shift; improved with repetition and cues.      Knee/Hip Exercises: Stretches   Passive Hamstring Stretch Left;2 reps;Right;1 rep;30 seconds   single leg long sitting   Quad Stretch Left;3 reps;30 seconds   prone with strap   Gastroc Stretch Left;2 reps;20 seconds   runners stretch     Knee/Hip Exercises: Aerobic   Recumbent Bike slow full revolutions to L1 for ROM of Lt knee x8 min      Knee/Hip Exercises: Standing   Heel Raises Both;1 set;10 reps    Lateral Step Up Left;1 set;10 reps;Hand Hold: 2;Step Height: 6"    Forward Step Up Left;1 set;5 reps;Hand Hold: 1;Step Height: 8"   clicking in knee when descending (backwards)   Functional Squat 1 set;10 reps;5 seconds   mini -  to 45   Stairs reciprocal pattern up/down 4-6" steps with bilat rail x 10.    SLS Lt/Rt SLS with horiz/vertical head turns x 20 sec x 2      Knee/Hip Exercises: Supine   Straight Leg Raises Strengthening;Left;1 set;10 reps   long sitting   Straight Leg Raise with External Rotation Strengthening;Left;1 set;10 reps   long sitting     Knee/Hip Exercises: Prone   Hamstring Curl 1 set;5 reps   partial range - no resistance   Prone Knee Hang 1 minute      [employer]   Create Space I strips of reg Rock tape applied to [employer] ant/med/lateral knee incisions to assist with edema reduction and scar management.                       PT Short Term Goals - 09/21/20 1517       PT SHORT TERM GOAL #1   Title Patient will be independent with initial HEP    Status Achieved    Target Date 09/18/20      PT SHORT TERM GOAL #2   Title Patient will be  able to elicit good VMO activation with quad set on L knee    Status Achieved    Target Date 09/18/20               PT Long Term Goals - 11/06/20 1556       PT LONG TERM GOAL #1   Title Pt will be independent with HEP    Time 6    Period Weeks    Status New    Target Date 12/18/20      PT LONG TERM GOAL #2   Title Pt will improve FOTO to 67 to demo improved functional mobility    Time 6    Period Weeks    Status New    Target Date 12/18/20      PT LONG TERM GOAL #3   Title Pt will improve Lt LE strength to 4+/5 to return to volleyball with decreasd pain    Time 6    Period Weeks    Status New    Target Date 12/18/20      PT LONG TERM GOAL #4   Title Pt will perform gait without AD with good technique x 300' to be functional in the community    Time 6    Period Weeks    Status New    Target Date 12/18/20                   Plan - 11/16/20 1113     Clinical Impression Statement Pt demonstrated improved Lt knee AROM and gait quality. She continues to have clicking in Lt knee with ascending / descending steps of any height.  She tolerated exercises well without symptoms.  Some increase in Lt knee swelliing with increase in activity since resuming WB; improved after use of vaso at end of session.  Progressing well towards goals.    Personal Factors and Comorbidities Fitness;Time since onset of injury/illness/exacerbation    Examination-Activity Limitations Locomotion Level;Stand;Stairs;Lift;Squat;Bathing    Examination-Participation Restrictions Community Activity;Occupation;Cleaning;Yard Work    Stability/Clinical Decision Making Stable/Uncomplicated    Rehab Potential Good    PT Frequency 2x / week    PT Duration 6 weeks    PT Treatment/Interventions Aquatic Therapy;Cryotherapy;Moist Heat;Iontophoresis 4mg /ml Dexamethasone;Electrical Stimulation;Gait training;Stair training;Neuromuscular re-education;Balance training;Therapeutic exercise;Therapeutic  activities;Patient/family education;Taping;Dry needling;Manual techniques;Vasopneumatic Device  PT Next Visit Plan gait/stairs, progress quad strength with closed chain exercises. MD NOTE   PT Home Exercise Plan [id number]    Consulted and Agree with Plan of Care Patient;Family member/caregiver    Family Member Consulted mother             Patient will benefit from skilled therapeutic intervention in order to improve the following deficits and impairments:  Pain, Decreased strength, Decreased activity tolerance, Decreased range of motion, Impaired sensation, Difficulty walking, Decreased balance, Abnormal gait  Visit Diagnosis: S/P ACL reconstruction  Acute pain of left knee  Muscle weakness (generalized)  Stiffness of left knee, not elsewhere classified  Other abnormalities of gait and mobility     Problem List Patient Active Problem List   Diagnosis Date Noted   Left anterior cruciate ligament tear    Peripheral tear of lateral meniscus of left knee as current injury    Acute medial meniscus tear of left knee    Fever 04/18/2020   Headache 04/18/2020   Vancomycin adverse reaction, initial encounter 04/16/2020   Chronic migraine without aura without status migrainosus, not intractable 01/28/2020   Chronic pain of both knees 01/28/2020   Ankle ligament laxity, left 12/19/2019    Mayer Camel, PTA 11/16/20 11:27 AM   [facility] [place] [address] [place], [place], [address] Phone: [phone]   Fax:  [phone]  Name: Kathryn Anderson MRN: [id number] Date of Birth: May 04, 2000

## 2020-11-18 ENCOUNTER — Ambulatory Visit (INDEPENDENT_AMBULATORY_CARE_PROVIDER_SITE_OTHER): Payer: Commercial Managed Care - PPO | Admitting: Physical Therapy

## 2020-11-18 ENCOUNTER — Other Ambulatory Visit: Payer: Self-pay

## 2020-11-18 DIAGNOSIS — Z9889 Other specified postprocedural states: Secondary | ICD-10-CM | POA: Diagnosis not present

## 2020-11-18 DIAGNOSIS — M6281 Muscle weakness (generalized): Secondary | ICD-10-CM

## 2020-11-18 DIAGNOSIS — M25662 Stiffness of left knee, not elsewhere classified: Secondary | ICD-10-CM

## 2020-11-18 DIAGNOSIS — M25562 Pain in left knee: Secondary | ICD-10-CM

## 2020-11-18 NOTE — Therapy (Signed)
[place] [employer] [place], [place], [address] Phone: [phone]   Fax:  [phone]  Physical Therapy Treatment  Patient Details  Name: Kathryn Anderson MRN: [id number] Date of Birth: 07/12/2000 Referring Provider (PT): Marcene Duos   Encounter Date: 11/18/2020   PT End of Session - 11/18/20 1556     Visit Number 5    Number of Visits 12    Date for PT Re-Evaluation 12/18/20    Authorization Type [employer] & [employer]    PT Start Time 1347    PT Stop Time 1430    PT Time Calculation (min) 43 min    Activity Tolerance Patient tolerated treatment well    Behavior During Therapy [facility] for tasks assessed/performed             Past Medical History:  Diagnosis Date   Headache     Past Surgical History:  Procedure Laterality Date   ANTERIOR CRUCIATE LIGAMENT REPAIR Left 10/05/2020   Procedure: LEFT KNEE ANTERIOR CRUCIATE LIGAMENT (ACL) RECONSTRUCTION WITH HAMSTRING AUTOGRAFT;  Surgeon: Meredith Pel, MD;  Location: [facility];  Service: Orthopedics;  Laterality: Left;   KNEE ARTHROSCOPY WITH MENISCAL REPAIR Left 10/05/2020   Procedure: MEDIAL MENISCAL REPAIR;  Surgeon: Meredith Pel, MD;  Location: [facility];  Service: Orthopedics;  Laterality: Left;   WISDOM TOOTH EXTRACTION      There were no vitals filed for this visit.   Subjective Assessment - 11/18/20 1352     Subjective Pt reports that her Lt medial knee has began to intermittently twitch, doesn't last long.  Pt continues to ascend steps at home with reciprocal pattern, but a step-to pattern with descend due to popping in knee.  She is now driving herself.  She plans to ask Dr when she can return to swimming when she sees him tomorrow.    Pertinent History history of dislocated knee cap x 2 on Lt    Currently in Pain? No/denies    Pain Score 0-No pain                [facility] PT Assessment - 11/18/20 0001        Assessment   Medical Diagnosis s/p ACL reconstruction    Referring Provider (PT) Marcene Duos    Onset Date/Surgical Date 10/05/20    Hand Dominance Right    Next MD Visit 11/19/20      AROM   Left Knee Extension 0    Left Knee Flexion 130      Flexibility   Quadriceps Lt - 122               [facility] Adult PT Treatment/Exercise - 11/18/20 0001       Blood Flow Restriction   Blood Flow Restriction Yes      Blood Flow Restriction-Positions    Blood Flow Restriction Position Standing      BFR Standing   Standing Limb Occulsion Pressure (mmHg) 290    Standing Exercise Pressure (mmHg) 203    Standing Exercise Prescription 30,15,15,15, reps w/ 30-60 sec rest    Standing Exercise Prescription Comment mini squats to 45.   One set of 20 heel raises after completing mini squats.      Exercises   Exercises Knee/Hip      Knee/Hip Exercises: Stretches   Passive Hamstring Stretch Left;2 reps;20 seconds    Quad Stretch Left;4 reps;30 seconds   prone with strap, towel above knee  Gastroc Stretch Left;2 reps;20 seconds   runners stretch     Knee/Hip Exercises: Aerobic   Recumbent Bike L1: 4.5 min for ROM and warm up.      Knee/Hip Exercises: Standing   Lateral Step Up Left;1 set;10 reps;Hand Hold: 2;Step Height: 6"    Forward Step Up Left;1 set;10 reps;Hand Hold: 2;Step Height: 6"    Step Down Right;1 set;10 reps;Step Height: 4"    SLS Lt SLS on blue pad x 30 sec with horiz/ head turns.  then Lt SLS with opp toe taps front, side, back x 6 reps      Modalities   Modalities Vasopneumatic      Vasopneumatic   Number Minutes Vasopneumatic  10 minutes    Vasopnuematic Location  Knee    Vasopneumatic Pressure Medium    Vasopneumatic Temperature  34                       PT Short Term Goals - 09/21/20 1517       PT SHORT TERM GOAL #1   Title Patient will be independent with initial HEP    Status Achieved    Target Date 09/18/20      PT SHORT TERM GOAL #2    Title Patient will be able to elicit good VMO activation with quad set on L knee    Status Achieved    Target Date 09/18/20               PT Long Term Goals - 11/18/20 1551       PT LONG TERM GOAL #1   Title Pt will be independent with HEP    Time 6    Period Weeks    Status On-going      PT LONG TERM GOAL #2   Title Pt will improve FOTO to 67 to demo improved functional mobility    Time 6    Period Weeks    Status On-going      PT LONG TERM GOAL #3   Title Pt will improve Lt LE strength to 4+/5 to return to volleyball with decreasd pain    Time 6    Period Weeks    Status On-going      PT LONG TERM GOAL #4   Title Pt will perform gait without AD with good technique x 300' to be functional in the community    Time 6    Period Weeks    Status Achieved                   Plan - 11/18/20 1552     Clinical Impression Statement Pt's Lt knee ROM is now 0-130.  She tolerated first trial of BFR with mini squats well.  She had no clicking in Lt knee with ascending/ descending 6" steps today, as she did in previous sessions.  She has met LTG#4.  She is progressing well towards LTGs.    Personal Factors and Comorbidities Fitness;Time since onset of injury/illness/exacerbation    Examination-Activity Limitations Locomotion Level;Stand;Stairs;Lift;Squat;Bathing    Examination-Participation Restrictions Community Activity;Occupation;Cleaning;Yard Work    Stability/Clinical Decision Making Stable/Uncomplicated    Rehab Potential Good    PT Frequency 2x / week    PT Duration 6 weeks    PT Treatment/Interventions Aquatic Therapy;Cryotherapy;Moist Heat;Iontophoresis 73m/ml Dexamethasone;Electrical Stimulation;Gait training;Stair training;Neuromuscular re-education;Balance training;Therapeutic exercise;Therapeutic activities;Patient/family education;Taping;Dry needling;Manual techniques;Vasopneumatic Device    PT Next Visit Plan gait/stairs, progress quad strength with  closed chain exercises.  PT Home Exercise Plan [id number]    Consulted and Agree with Plan of Care Patient    Family Member Consulted --             Patient will benefit from skilled therapeutic intervention in order to improve the following deficits and impairments:  Pain, Decreased strength, Decreased activity tolerance, Decreased range of motion, Impaired sensation, Difficulty walking, Decreased balance, Abnormal gait  Visit Diagnosis: S/P ACL reconstruction  Acute pain of left knee  Muscle weakness (generalized)  Stiffness of left knee, not elsewhere classified     Problem List Patient Active Problem List   Diagnosis Date Noted   Left anterior cruciate ligament tear    Peripheral tear of lateral meniscus of left knee as current injury    Acute medial meniscus tear of left knee    Fever 04/18/2020   Headache 04/18/2020   Vancomycin adverse reaction, initial encounter 04/16/2020   Chronic migraine without aura without status migrainosus, not intractable 01/28/2020   Chronic pain of both knees 01/28/2020   Ankle ligament laxity, left 12/19/2019   Kerin Perna, PTA 11/18/20 4:01 PM   [place], [place], [address] Phone: [phone]   Fax:  [phone]  Name: Kaymarie Wynn MRN: [id number] Date of Birth: November 08, 2000

## 2020-11-19 ENCOUNTER — Ambulatory Visit (INDEPENDENT_AMBULATORY_CARE_PROVIDER_SITE_OTHER): Payer: Commercial Managed Care - PPO | Admitting: Orthopedic Surgery

## 2020-11-19 DIAGNOSIS — Z9889 Other specified postprocedural states: Secondary | ICD-10-CM

## 2020-11-20 ENCOUNTER — Encounter: Payer: Self-pay | Admitting: Orthopedic Surgery

## 2020-11-20 ENCOUNTER — Other Ambulatory Visit: Payer: Self-pay

## 2020-11-20 ENCOUNTER — Ambulatory Visit (INDEPENDENT_AMBULATORY_CARE_PROVIDER_SITE_OTHER): Payer: Commercial Managed Care - PPO | Admitting: Physical Therapy

## 2020-11-20 DIAGNOSIS — Z9889 Other specified postprocedural states: Secondary | ICD-10-CM

## 2020-11-20 DIAGNOSIS — M25562 Pain in left knee: Secondary | ICD-10-CM | POA: Diagnosis not present

## 2020-11-20 DIAGNOSIS — M6281 Muscle weakness (generalized): Secondary | ICD-10-CM | POA: Diagnosis not present

## 2020-11-20 DIAGNOSIS — M25662 Stiffness of left knee, not elsewhere classified: Secondary | ICD-10-CM

## 2020-11-20 NOTE — Progress Notes (Signed)
Post-Op Visit Note   Patient: Kathryn Anderson           Date of Birth: Mar 15, 2000           MRN: [id number] Visit Date: 11/19/2020 PCP: Lavada Mesi, MD   Assessment & Plan:  Chief Complaint:  Chief Complaint  Patient presents with   Left Knee - Routine Post Op     10/05/20 (6w 3d) Cammy Copa, MD   Left Knee Anterior Cruciate Ligament (acl) Reconstruction With Hamstring Autograft - Left   Medial Meniscal Repair - Left      Visit Diagnoses:  1. S/P ACL reconstruction     Plan: Kathryn Anderson is a 20 year old patient who underwent left knee ACL reconstruction with medial meniscal repair 10/05/2020.  She is doing well overall.  She started doing BFR treatment and physical therapy.  Also mini squats to 45 degrees and leg raises and bike.  She is walking without crutches.  She uses a brace when she is out of the house.  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  On examination all incisions are intact.  Trace effusion present.  Range of motion 0-1 28.  Graft stability excellent.  I feel no mechanical symptoms particularly on the medial side.  Plan at this time is continue working on primarily leg strengthening.  I do want to avoid any type of weighted loading of the knee in flexion greater than 90 degrees.  Okay for hamstring strengthening but again with no flexion past 90.  Follow-up in 6 weeks for clinical recheck.  In terms of her going back to work I do not think it is a great idea for her to squat on her knees yet.  Sitting crosslegged and is fine but I did caution her in terms of loading the left knee when she is going from the seated to standing position.  Follow-Up Instructions: Return in about 6 weeks (around 12/31/2020).   Orders:  No orders of the defined types were placed in this encounter.  No orders of the defined types were placed in this encounter.   Imaging: No results found.  PMFS  History: Patient Active Problem List   Diagnosis Date Noted   Left anterior cruciate ligament tear    Peripheral tear of lateral meniscus of left knee as current injury    Acute medial meniscus tear of left knee    Fever 04/18/2020   Headache 04/18/2020   Vancomycin adverse reaction, initial encounter 04/16/2020   Chronic migraine without aura without status migrainosus, not intractable 01/28/2020   Chronic pain of both knees 01/28/2020   Ankle ligament laxity, left 12/19/2019   Past Medical History:  Diagnosis Date   Headache     Family History  Problem Relation Age of Onset   Healthy Mother    Healthy Father    Macular degeneration Maternal Grandmother    Glaucoma Maternal Grandmother    Dementia Paternal Grandfather    Heart disease Neg Hx     Past Surgical History:  Procedure Laterality Date   ANTERIOR CRUCIATE LIGAMENT REPAIR Left 10/05/2020   Procedure: LEFT KNEE ANTERIOR CRUCIATE LIGAMENT (ACL) RECONSTRUCTION WITH HAMSTRING AUTOGRAFT;  Surgeon: Cammy Copa, MD;  Location: [facility];  Service: Orthopedics;  Laterality: Left;   KNEE ARTHROSCOPY WITH MENISCAL REPAIR Left 10/05/2020   Procedure: MEDIAL MENISCAL REPAIR;  Surgeon: Cammy Copa, MD;  Location: [facility];  Service: Orthopedics;  Laterality: Left;   WISDOM TOOTH EXTRACTION     Social History   Occupational History   Not on file  Tobacco Use   Smoking status: Never   Smokeless tobacco: Never  Vaping Use   Vaping Use: Never used  Substance and Sexual Activity   Alcohol use: Never   Drug use: Never   Sexual activity: Not on file

## 2020-11-20 NOTE — Therapy (Signed)
[facility] [place] [address] [place], [place], [address] Phone: [phone]   Fax:  [phone]  Physical Therapy Treatment  Patient Details  Name: Kathryn Anderson MRN: [id number] Date of Birth: June 04, 2000 Referring Provider (PT): Rise Paganini   Encounter Date: 11/20/2020   PT End of Session - 11/20/20 1141     Visit Number 6    Number of Visits 12    Date for PT Re-Evaluation 12/18/20    Authorization Type [employer] & [employer]    PT Start Time 1102    PT Stop Time 1150    PT Time Calculation (min) 48 min    Activity Tolerance Patient tolerated treatment well    Behavior During Therapy [facility] for tasks assessed/performed             Past Medical History:  Diagnosis Date   Headache     Past Surgical History:  Procedure Laterality Date   ANTERIOR CRUCIATE LIGAMENT REPAIR Left 10/05/2020   Procedure: LEFT KNEE ANTERIOR CRUCIATE LIGAMENT (ACL) RECONSTRUCTION WITH HAMSTRING AUTOGRAFT;  Surgeon: Cammy Copa, MD;  Location: [facility];  Service: Orthopedics;  Laterality: Left;   KNEE ARTHROSCOPY WITH MENISCAL REPAIR Left 10/05/2020   Procedure: MEDIAL MENISCAL REPAIR;  Surgeon: Cammy Copa, MD;  Location: [facility];  Service: Orthopedics;  Laterality: Left;   WISDOM TOOTH EXTRACTION      There were no vitals filed for this visit.   Subjective Assessment - 11/20/20 1103     Subjective Pt reports she saw surgeon yesterday.  She is to wait 6 wks before returning to swimming. She is not allowed to kneel, and needs to be cautious getting up/down from floor.  Can do resisted hamstring exercises, but only to 90 deg. He is not concerned about popping unless it becomes painful.   She reports no new changes since last visit.    Pertinent History history of dislocated knee cap x 2 on Lt    Currently in Pain? No/denies    Pain Score 0-No pain                [facility] PT Assessment -  11/20/20 0001       Assessment   Medical Diagnosis s/p ACL reconstruction    Referring Provider (PT) Rise Paganini    Onset Date/Surgical Date 10/05/20    Hand Dominance Right    Next MD Visit 6 wks             [facility] Adult PT Treatment/Exercise - 11/20/20 0001       BFR Standing   Standing Exercise Pressure (mmHg) 188    Standing Exercise Prescription 30,15,15,15, reps w/ 30-60 sec rest    Standing Exercise Prescription Comment mini squats to 45.   One set of 20 heel raises after completing mini squats for prescription above.  Standing Lt hamstring curls to 65 x 10.      Knee/Hip Exercises: Stretches   Passive Hamstring Stretch Left;2 reps;20 seconds    Quad Stretch Left;30 seconds;3 reps   prone with strap, towel above knee   Gastroc Stretch Left;2 reps;20 seconds   runners stretch   Other Knee/Hip Stretches mini side to side lunges x 10      Knee/Hip Exercises: Aerobic   Recumbent Bike L1: 4.5 min for ROM and warm up.      Knee/Hip Exercises: Standing   Forward Step Up Left;1 set;15 reps;Hand Hold: 2;Step Height: 6"    Step Down  Right;1 set;10 reps;Hand Hold: 2;Step Height: 4"   and retro step up     Knee/Hip Exercises: Supine   Bridges Strengthening;Both;1 set;10 reps   arms crossed chest   Other Supine Knee/Hip Exercises hips at 90 deg ,knees straight toe reaches x 10 reps, 5 reps (each leg)      Knee/Hip Exercises: Sidelying   Other Sidelying Knee/Hip Exercises side plank x 5-10 sec x 2 reps each side.      Knee/Hip Exercises: Prone   Other Prone Exercises forearm plank x 10 sec x 3 reps      Modalities   Modalities Vasopneumatic      Vasopneumatic   Number Minutes Vasopneumatic  10 minutes    Vasopnuematic Location  Knee    Vasopneumatic Pressure Medium    Vasopneumatic Temperature  34                       PT Short Term Goals - 09/21/20 1517       PT SHORT TERM GOAL #1   Title Patient will be independent with initial HEP    Status  Achieved    Target Date 09/18/20      PT SHORT TERM GOAL #2   Title Patient will be able to elicit good VMO activation with quad set on L knee    Status Achieved    Target Date 09/18/20               PT Long Term Goals - 11/18/20 1551       PT LONG TERM GOAL #1   Title Pt will be independent with HEP    Time 6    Period Weeks    Status On-going      PT LONG TERM GOAL #2   Title Pt will improve FOTO to 67 to demo improved functional mobility    Time 6    Period Weeks    Status On-going      PT LONG TERM GOAL #3   Title Pt will improve Lt LE strength to 4+/5 to return to volleyball with decreasd pain    Time 6    Period Weeks    Status On-going      PT LONG TERM GOAL #4   Title Pt will perform gait without AD with good technique x 300' to be functional in the community    Time 6    Period Weeks    Status Achieved                   Plan - 11/20/20 1141     Clinical Impression Statement Pt tolerated BFR well - set at 65% today.  New guidelines from MD in subjective statement.  Added core strengthening for variety and assisting return to PLOF.  Progressing well towards LTGs.    Personal Factors and Comorbidities Fitness;Time since onset of injury/illness/exacerbation    Examination-Activity Limitations Locomotion Level;Stand;Stairs;Lift;Squat;Bathing    Examination-Participation Restrictions Community Activity;Occupation;Cleaning;Yard Work    Stability/Clinical Decision Making Stable/Uncomplicated    Rehab Potential Good    PT Frequency 2x / week    PT Duration 6 weeks    PT Treatment/Interventions Aquatic Therapy;Cryotherapy;Moist Heat;Iontophoresis 4mg /ml Dexamethasone;Electrical Stimulation;Gait training;Stair training;Neuromuscular re-education;Balance training;Therapeutic exercise;Therapeutic activities;Patient/family education;Taping;Dry needling;Manual techniques;Vasopneumatic Device    PT Next Visit Plan gait/stairs, progress quad strength with  closed chain exercises.    PT Home Exercise Plan [email or web address]    Consulted and Agree with Plan of Care Patient  Patient will benefit from skilled therapeutic intervention in order to improve the following deficits and impairments:  Pain, Decreased strength, Decreased activity tolerance, Decreased range of motion, Impaired sensation, Difficulty walking, Decreased balance, Abnormal gait  Visit Diagnosis: No diagnosis found.     Problem List Patient Active Problem List   Diagnosis Date Noted   Left anterior cruciate ligament tear    Peripheral tear of lateral meniscus of left knee as current injury    Acute medial meniscus tear of left knee    Fever 04/18/2020   Headache 04/18/2020   Vancomycin adverse reaction, initial encounter 04/16/2020   Chronic migraine without aura without status migrainosus, not intractable 01/28/2020   Chronic pain of both knees 01/28/2020   Ankle ligament laxity, left 12/19/2019   Mayer Camel, PTA 11/20/20 11:46 AM   [facility] [place] [address] [place] [address] [place], [place], [address] Phone: [phone]   Fax:  [phone]  Name: Maitri Schnoebelen MRN: [id number] Date of Birth: February 10, 2001

## 2020-11-23 ENCOUNTER — Other Ambulatory Visit: Payer: Self-pay

## 2020-11-23 ENCOUNTER — Ambulatory Visit (INDEPENDENT_AMBULATORY_CARE_PROVIDER_SITE_OTHER): Payer: Commercial Managed Care - PPO | Admitting: Rehabilitative and Restorative Service Providers"

## 2020-11-23 DIAGNOSIS — Z9889 Other specified postprocedural states: Secondary | ICD-10-CM

## 2020-11-23 DIAGNOSIS — M25662 Stiffness of left knee, not elsewhere classified: Secondary | ICD-10-CM

## 2020-11-23 DIAGNOSIS — M6281 Muscle weakness (generalized): Secondary | ICD-10-CM

## 2020-11-23 DIAGNOSIS — M25562 Pain in left knee: Secondary | ICD-10-CM

## 2020-11-23 NOTE — Therapy (Signed)
[facility] [place] [address] [place], [place], [address] Phone: [phone]   Fax:  [phone]  Physical Therapy Treatment  Patient Details  Name: Kathryn Anderson MRN: [id number] Date of Birth: 2001-03-05 Referring Provider (PT): Rise Paganini   Encounter Date: 11/23/2020   PT End of Session - 11/23/20 1354     Visit Number 7    Number of Visits 12    Date for PT Re-Evaluation 12/18/20    Authorization Type [employer] & [employer]    PT Start Time 1350    PT Stop Time 1440    PT Time Calculation (min) 50 min    Activity Tolerance Patient tolerated treatment well    Behavior During Therapy [facility] for tasks assessed/performed             Past Medical History:  Diagnosis Date   Headache     Past Surgical History:  Procedure Laterality Date   ANTERIOR CRUCIATE LIGAMENT REPAIR Left 10/05/2020   Procedure: LEFT KNEE ANTERIOR CRUCIATE LIGAMENT (ACL) RECONSTRUCTION WITH HAMSTRING AUTOGRAFT;  Surgeon: Cammy Copa, MD;  Location: [facility];  Service: Orthopedics;  Laterality: Left;   KNEE ARTHROSCOPY WITH MENISCAL REPAIR Left 10/05/2020   Procedure: MEDIAL MENISCAL REPAIR;  Surgeon: Cammy Copa, MD;  Location: [facility];  Service: Orthopedics;  Laterality: Left;   WISDOM TOOTH EXTRACTION      There were no vitals filed for this visit.   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  She is noticing increased swelling and soreness this week.    Pertinent History history of dislocated knee cap x 2 on Lt    Currently in Pain? Yes    Pain Score 3     Pain Location Knee    Pain Orientation Left;Anterior    Pain Descriptors / Indicators Sore    Pain Type Surgical pain    Pain Onset More than a month ago    Aggravating Factors  sitting    Pain Relieving Factors  ice, elevation                [facility] PT Assessment - 11/23/20 1355       Assessment   Medical Diagnosis s/p ACL reconstruction    Referring Provider (PT) Rise Paganini    Onset Date/Surgical Date 10/05/20      Precautions   Precautions Knee   follow protocol                          [facility] Adult PT Treatment/Exercise - 11/23/20 2146       Blood Flow Restriction   Blood Flow Restriction Yes      Blood Flow Restriction-Positions    Blood Flow Restriction Position Supine;Standing      BFR Standing   Standing Exercise Pressure (mmHg) 188    Standing Exercise Prescription 30,15,15,15, reps w/ 30-60 sec rest    Standing Exercise Prescription Comment mini squat to 45 degrees; then utilized BFR for SLR supine same rep schedule      Exercises   Exercises Knee/Hip      Knee/Hip Exercises: Stretches   Passive Hamstring Stretch Left;1 rep;20 seconds      Knee/Hip Exercises: Aerobic   Elliptical x 1 minute level 1    Recumbent Bike L1: 4.5 min for  ROM and warm up.      Knee/Hip Exercises: Standing   Functional Squat --   *see Blood Flow Restriction   SLS L SLS on solid surface    Gait Training gait emphasizing equal step length    Other Standing Knee Exercises backwards walking      Knee/Hip Exercises: Seated   Other Seated Knee/Hip Exercises multi-angle isometrics pushing into a physioball    Hamstring Limitations isometrics with heel dig      Knee/Hip Exercises: Supine   Bridges Strengthening;Both;1 set;10 reps    Straight Leg Raises Strengthening;Left;1 set;10 reps    Straight Leg Raises Limitations elbow propping/long sitting for SLR      Knee/Hip Exercises: Sidelying   Other Sidelying Knee/Hip Exercises side plank x 5-10 sec x 2 reps each side.      Modalities   Modalities Vasopneumatic      Vasopneumatic   Number Minutes Vasopneumatic  10 minutes    Vasopnuematic Location  Knee    Vasopneumatic Pressure Medium    Vasopneumatic Temperature   34                          PT Long Term Goals - 11/18/20 1551       PT LONG TERM GOAL #1   Title Pt will be independent with HEP    Time 6    Period Weeks    Status On-going      PT LONG TERM GOAL #2   Title Pt will improve FOTO to 67 to demo improved functional mobility    Time 6    Period Weeks    Status On-going      PT LONG TERM GOAL #3   Title Pt will improve Lt LE strength to 4+/5 to return to volleyball with decreasd pain    Time 6    Period Weeks    Status On-going      PT LONG TERM GOAL #4   Title Pt will perform gait without AD with good technique x 300' to be functional in the community    Time 6    Period Weeks    Status Achieved                   Plan - 11/23/20 2154     Clinical Impression Statement The patient tolerates continued progression of ther ex well within protocol parameters.  Plan to continue progress to LTGs emphasizing LE strengthening, normalizing gait mechanics, and progressing tolerance to load.    PT Treatment/Interventions Aquatic Therapy;Cryotherapy;Moist Heat;Iontophoresis 4mg /ml Dexamethasone;Electrical Stimulation;Gait training;Stair training;Neuromuscular re-education;Balance training;Therapeutic exercise;Therapeutic activities;Patient/family education;Taping;Dry needling;Manual techniques;Vasopneumatic Device    PT Next Visit Plan gait/stairs, progress quad strength with closed chain exercises.    PT Home Exercise Plan [email or web address]    Consulted and Agree with Plan of Care Patient             Patient will benefit from skilled therapeutic intervention in order to improve the following deficits and impairments:     Visit Diagnosis: S/P ACL reconstruction  Acute pain of left knee  Muscle weakness (generalized)  Stiffness of left knee, not elsewhere classified     Problem List Patient Active Problem List   Diagnosis Date Noted   Left anterior cruciate ligament tear    Peripheral tear of lateral  meniscus of left knee as current injury    Acute medial meniscus tear of left knee    Fever 04/18/2020  Headache 04/18/2020   Vancomycin adverse reaction, initial encounter 04/16/2020   Chronic migraine without aura without status migrainosus, not intractable 01/28/2020   Chronic pain of both knees 01/28/2020   Ankle ligament laxity, left 12/19/2019    Myriam Brandhorst, PT 11/23/2020, 9:57 PM  [facility] [address] [place], [place], [address] Phone: [phone]   Fax:  [phone]  Name: Kathryn Anderson MRN: [id number] Date of Birth: 2000/06/24

## 2020-11-25 ENCOUNTER — Ambulatory Visit (INDEPENDENT_AMBULATORY_CARE_PROVIDER_SITE_OTHER): Payer: Commercial Managed Care - PPO | Admitting: Physical Therapy

## 2020-11-25 ENCOUNTER — Other Ambulatory Visit: Payer: Self-pay

## 2020-11-25 DIAGNOSIS — Z9889 Other specified postprocedural states: Secondary | ICD-10-CM | POA: Diagnosis not present

## 2020-11-25 DIAGNOSIS — M25562 Pain in left knee: Secondary | ICD-10-CM

## 2020-11-25 DIAGNOSIS — M6281 Muscle weakness (generalized): Secondary | ICD-10-CM | POA: Diagnosis not present

## 2020-11-25 DIAGNOSIS — M25662 Stiffness of left knee, not elsewhere classified: Secondary | ICD-10-CM

## 2020-11-25 NOTE — Therapy (Signed)
[facility] [place] [address] [place] [address] [place], [place], [address] Phone: [phone]   Fax:  [phone]  Physical Therapy Treatment  Patient Details  Name: Erendida Wrenn MRN: [id number] Date of Birth: 12-Apr-2000 Referring Provider (PT): Rise Paganini   Encounter Date: 11/25/2020   PT End of Session - 11/25/20 1514     Visit Number 8    Number of Visits 12    Date for PT Re-Evaluation 12/18/20    Authorization Type [employer] & [employer]    PT Start Time 1431    PT Stop Time 1519    PT Time Calculation (min) 48 min    Activity Tolerance Patient tolerated treatment well    Behavior During Therapy [facility] for tasks assessed/performed             Past Medical History:  Diagnosis Date   Headache     Past Surgical History:  Procedure Laterality Date   ANTERIOR CRUCIATE LIGAMENT REPAIR Left 10/05/2020   Procedure: LEFT KNEE ANTERIOR CRUCIATE LIGAMENT (ACL) RECONSTRUCTION WITH HAMSTRING AUTOGRAFT;  Surgeon: Cammy Copa, MD;  Location: [facility];  Service: Orthopedics;  Laterality: Left;   KNEE ARTHROSCOPY WITH MENISCAL REPAIR Left 10/05/2020   Procedure: MEDIAL MENISCAL REPAIR;  Surgeon: Cammy Copa, MD;  [facility];  Service: Orthopedics;  Laterality: Left;   WISDOM TOOTH EXTRACTION      There were no vitals filed for this visit.   Subjective Assessment - 11/25/20 1516     Subjective Pt reports more swelling and stiffness in Lt knee since going back to work.  No other new changes.    Pertinent History history of dislocated knee cap x 2 on Lt    Currently in Pain? No/denies    Pain Score 0-No pain                [facility] PT Assessment - 11/25/20 0001       Assessment   Medical Diagnosis s/p ACL reconstruction    Referring Provider (PT) Rise Paganini    Onset Date/Surgical Date 10/05/20    Hand Dominance Right    Next MD Visit 12/30/20      Flexibility   Quadriceps Lt  - 125               [facility] Adult PT Treatment/Exercise - 11/25/20 0001       Knee/Hip Exercises: Stretches   Passive Hamstring Stretch Left;2 reps;30 seconds    Quad Stretch Left;3 reps;30 seconds      Knee/Hip Exercises: Aerobic   Elliptical L1: 3 min      Knee/Hip Exercises: Standing   Heel Raises Limitations warrior 1 heel raises with arms overhead x 15 each leg (modified depth of pose)    Forward Step Up Left;1 set;10 reps;Hand Hold: 1;Step Height: 8"    Forward Step Up Limitations difficulty descending backwards - added pad for Rt foot to step on.    SLS Lt SLS on blue foam with mini squat and opp toe taps front, side, back - with RUE support on wall x 7 reps (LLE very tremulous)    Lt hamstring curls x 5 sec hold x 15 reps    Other Standing Knee Exercises side stepping with high knees    Other Standing Knee Exercises standing to/from floor (without kneeling on Lt knee) x 1 rep      Knee/Hip Exercises: Seated   Long Arc Quad Left;Strengthening;1 set;15 reps    Long  Arc Quad Weight 2 lbs.   90-45 - 5 sec holds     Knee/Hip Exercises: Sidelying   Other Sidelying Knee/Hip Exercises side plank on forearm x 10 sec 2 reps each side.      Knee/Hip Exercises: Prone   Other Prone Exercises forearm plank x 30 sec, then high plank with hip ext x 3 reps each leg.    Other Prone Exercises forearm to/from high plank x 2 reps      Modalities   Modalities Vasopneumatic      Vasopneumatic   Number Minutes Vasopneumatic  10 minutes    Vasopnuematic Location  Knee   Lt   Vasopneumatic Pressure High    Vasopneumatic Temperature  34                PT Long Term Goals - 11/18/20 1551       PT LONG TERM GOAL #1   Title Pt will be independent with HEP    Time 6    Period Weeks    Status On-going      PT LONG TERM GOAL #2   Title Pt will improve FOTO to 67 to demo improved functional mobility    Time 6    Period Weeks    Status On-going      PT LONG TERM GOAL #3    Title Pt will improve Lt LE strength to 4+/5 to return to volleyball with decreasd pain    Time 6    Period Weeks    Status On-going      PT LONG TERM GOAL #4   Title Pt will perform gait without AD with good technique x 300' to be functional in the community    Time 6    Period Weeks    Status Achieved                   Plan - 11/25/20 1506     Clinical Impression Statement Pt reported occasional brief pain in medial Lt knee with closed chain knee flexion; resolved with rest.  She tolerated all other exercises well.   Progressing well towards LTGs.    PT Treatment/Interventions Aquatic Therapy;Cryotherapy;Moist Heat;Iontophoresis 4mg /ml Dexamethasone;Electrical Stimulation;Gait training;Stair training;Neuromuscular re-education;Balance training;Therapeutic exercise;Therapeutic activities;Patient/family education;Taping;Dry needling;Manual techniques;Vasopneumatic Device    PT Next Visit Plan gait/stairs, progress quad strength with closed chain exercises.    PT Home Exercise Plan [email or web address]    Consulted and Agree with Plan of Care Patient             Patient will benefit from skilled therapeutic intervention in order to improve the following deficits and impairments:  Pain, Decreased strength, Decreased activity tolerance, Decreased range of motion, Impaired sensation, Difficulty walking, Decreased balance, Abnormal gait  Visit Diagnosis: S/P ACL reconstruction  Acute pain of left knee  Muscle weakness (generalized)  Stiffness of left knee, not elsewhere classified     Problem List Patient Active Problem List   Diagnosis Date Noted   Left anterior cruciate ligament tear    Peripheral tear of lateral meniscus of left knee as current injury    Acute medial meniscus tear of left knee    Fever 04/18/2020   Headache 04/18/2020   Vancomycin adverse reaction, initial encounter 04/16/2020   Chronic migraine without aura without status migrainosus, not intractable  01/28/2020   Chronic pain of both knees 01/28/2020   Ankle ligament laxity, left 12/19/2019    12/21/2019, PTA 11/25/20 4:54 PM   [facility] [address] [place] [address] [place], [place], [address] Phone: [phone]   Fax:  [phone]  Name: Emiah Pellicano MRN: [id number] Date of Birth: [birth year]

## 2020-11-27 ENCOUNTER — Encounter: Payer: Commercial Managed Care - PPO | Admitting: Rehabilitative and Restorative Service Providers"

## 2020-11-30 ENCOUNTER — Other Ambulatory Visit: Payer: Self-pay

## 2020-11-30 ENCOUNTER — Ambulatory Visit (INDEPENDENT_AMBULATORY_CARE_PROVIDER_SITE_OTHER): Payer: Commercial Managed Care - PPO | Admitting: Rehabilitative and Restorative Service Providers"

## 2020-11-30 ENCOUNTER — Encounter: Payer: Self-pay | Admitting: Rehabilitative and Restorative Service Providers"

## 2020-11-30 DIAGNOSIS — R2689 Other abnormalities of gait and mobility: Secondary | ICD-10-CM

## 2020-11-30 DIAGNOSIS — M6281 Muscle weakness (generalized): Secondary | ICD-10-CM | POA: Diagnosis not present

## 2020-11-30 DIAGNOSIS — Z9889 Other specified postprocedural states: Secondary | ICD-10-CM

## 2020-11-30 DIAGNOSIS — M25562 Pain in left knee: Secondary | ICD-10-CM | POA: Diagnosis not present

## 2020-11-30 DIAGNOSIS — M25662 Stiffness of left knee, not elsewhere classified: Secondary | ICD-10-CM | POA: Diagnosis not present

## 2020-11-30 NOTE — Patient Instructions (Signed)
Access Code: [email or web address] URL: [email or web address] Date: 11/30/2020 Prepared by: Margretta Ditty  Exercises Prone Quadriceps Set - 2 x daily - 7 x weekly - 1 sets - 10 reps - 10 hold Straight Leg Raise with Arm Support - 1 x daily - 7 x weekly - 2-3 sets - 10 reps Sidelying Hip Adduction - 1 x daily - 7 x weekly - 2-3 sets - 10 reps Sidelying Hip Abduction - 1 x daily - 7 x weekly - 2-3 sets - 10 reps Supine Bridge - 1 x daily - 7 x weekly - 2 sets - 10 reps - 5 hold Prone Alternating Arm and Leg Lifts - 1 x daily - 7 x weekly - 2 sets - 10 reps Seated Table Hamstring Stretch - 2 x daily - 7 x weekly - 3 sets - 2-3 reps - 30 seconds hold Prone Quadriceps Stretch with Strap - 2 x daily - 7 x weekly - 1 sets - 2-3 reps - 30 seconds hold  Patient Education Scar Massage

## 2020-11-30 NOTE — Therapy (Signed)
[facility] [place] [address] [place], [place], [address] Phone: [phone]   Fax:  [phone]  Physical Therapy Treatment  Patient Details  Name: Dynastee Brummell MRN: [id number] Date of Birth: [birth year] Referring Provider (PT): Rise Paganini   Encounter Date: 11/30/2020   PT End of Session - 11/30/20 1439     Visit Number 9    Number of Visits 12    Date for PT Re-Evaluation 12/18/20    Authorization Type [employer] & [employer]    PT Start Time 1434    PT Stop Time 1520    PT Time Calculation (min) 46 min    Activity Tolerance Patient tolerated treatment well    Behavior During Therapy [facility] for tasks assessed/performed             Past Medical History:  Diagnosis Date   Headache     Past Surgical History:  Procedure Laterality Date   ANTERIOR CRUCIATE LIGAMENT REPAIR Left 10/05/2020   Procedure: LEFT KNEE ANTERIOR CRUCIATE LIGAMENT (ACL) RECONSTRUCTION WITH HAMSTRING AUTOGRAFT;  Surgeon: Cammy Copa, MD;  Location: [facility];  Service: Orthopedics;  Laterality: Left;   KNEE ARTHROSCOPY WITH MENISCAL REPAIR Left 10/05/2020   Procedure: MEDIAL MENISCAL REPAIR;  Surgeon: Cammy Copa, MD;  Location: [facility];  Service: Orthopedics;  Laterality: Left;   WISDOM TOOTH EXTRACTION      There were no vitals filed for this visit.   Subjective Assessment - 11/30/20 1438     Subjective The patient reports some soreness on the inside of the knee at times. She notes this more on steps.    Pertinent History history of dislocated knee cap x 2 on Lt    Patient Stated Goals Play volleyball    Currently in Pain? No/denies                [facility] PT Assessment - 11/30/20 1445       Assessment   Medical Diagnosis s/p ACL reconstruction    Referring Provider (PT) Rise Paganini    Onset Date/Surgical Date 10/05/20                           [facility]  Treatment/Exercise - 11/30/20 1456       Ambulation/Gait   Ambulation/Gait Yes    Ambulation/Gait Assistance 7: Independent      Exercises   Exercises Knee/Hip      Knee/Hip Exercises: Stretches   Quad Stretch Limitations during hamstring curls performed holds for quad, anterior knee stretch      Knee/Hip Exercises: Aerobic   Elliptical L1 x 1.5 minutes    Recumbent Bike L1 x 3.5 minutes      Knee/Hip Exercises: Standing   Heel Raises Left;10 reps    Heel Raises Limitations notes some medial knee discomfort as she nears 10 reps    Hip Flexion Stengthening;Both;10 reps    Hip Flexion Limitations standing marching    Hip Abduction Stengthening;Both    Abduction Limitations sidestepping R and L sides    Forward Step Up Left;10 reps    Forward Step Up Limitations with contralateral LE march on 4" and 6" step; notes some disccomfort when lowering contralateral leg to the ground    Functional Squat 10 reps    Functional Squat Limitations Able to go lower with squat today per protocol (at 8 weeks)    SLS L SLS adding heel raises  SLS with Vectors the diver reaching to a chair x 5 reps L side    Other Standing Knee Exercises side stepping x 15 feet R and L sides      Knee/Hip Exercises: Seated   Long Arc Quad Left;Strengthening;1 set;15 reps    Hamstring Limitations isometrics with heel dig      Knee/Hip Exercises: Supine   Financial planner;Both   no difficulty-- did not continue ex   Straight Leg Raises Strengthening;Left    Straight Leg Raises Limitations 2 lbs      Knee/Hip Exercises: Sidelying   Hip ABduction Strengthening;Left;1 set;15 reps    Hip ABduction Limitations 2 lbs weight      Knee/Hip Exercises: Prone   Hamstring Curl 10 reps      Modalities   Modalities Vasopneumatic      Vasopneumatic   Number Minutes Vasopneumatic  10 minutes    Vasopnuematic Location  Knee    Vasopneumatic Pressure Medium    Vasopneumatic Temperature  34      Manual Therapy    Manual therapy comments scar tissue mobiization medial incision with handout                     PT Education - 11/30/20 1518     Education Details HEP    Person(s) Educated Patient    Methods Explanation;Demonstration;Handout    Comprehension Verbalized understanding;Returned demonstration                 PT Long Term Goals - 11/18/20 1551       PT LONG TERM GOAL #1   Title Pt will be independent with HEP    Time 6    Period Weeks    Status On-going      PT LONG TERM GOAL #2   Title Pt will improve FOTO to 67 to demo improved functional mobility    Time 6    Period Weeks    Status On-going      PT LONG TERM GOAL #3   Title Pt will improve Lt LE strength to 4+/5 to return to volleyball with decreasd pain    Time 6    Period Weeks    Status On-going      PT LONG TERM GOAL #4   Title Pt will perform gait without AD with good technique x 300' to be functional in the community    Time 6    Period Weeks    Status Achieved                   Plan - 11/30/20 2116     Clinical Impression Statement The patient has reached 8 weeks per protocol.  She is demonstrating more normalized gait mechanics, ability to accept greater load in the L LE. PT continuing to progress within tissue tolerance and per protocol working on return to strengthening activities.    PT Treatment/Interventions Aquatic Therapy;Cryotherapy;Moist Heat;Iontophoresis 4mg /ml Dexamethasone;Electrical Stimulation;Gait training;Stair training;Neuromuscular re-education;Balance training;Therapeutic exercise;Therapeutic activities;Patient/family education;Taping;Dry needling;Manual techniques;Vasopneumatic Device    PT Next Visit Plan gait/stairs, progress quad strength with closed chain exercises; progress per protocol    PT Home Exercise Plan [email or web address]    Consulted and Agree with Plan of Care Patient             Patient will benefit from skilled therapeutic intervention in order  to improve the following deficits and impairments:     Visit Diagnosis: S/P ACL reconstruction  Acute pain of left knee  Muscle weakness (generalized)  Stiffness of left knee, not elsewhere classified  Other abnormalities of gait and mobility     Problem List Patient Active Problem List   Diagnosis Date Noted   Left anterior cruciate ligament tear    Peripheral tear of lateral meniscus of left knee as current injury    Acute medial meniscus tear of left knee    Fever 04/18/2020   Headache 04/18/2020   Vancomycin adverse reaction, initial encounter 04/16/2020   Chronic migraine without aura without status migrainosus, not intractable 01/28/2020   Chronic pain of both knees 01/28/2020   Ankle ligament laxity, left 12/19/2019    Zabian Swayne, PT 11/30/2020, 9:17 PM  [facility] [address] [place], [place], [address] Phone: [phone]   Fax:  [phone]  Name: Beulah Capobianco MRN: [id number] Date of Birth: October 31, 2000

## 2020-12-02 ENCOUNTER — Other Ambulatory Visit: Payer: Self-pay

## 2020-12-02 ENCOUNTER — Encounter: Payer: Self-pay | Admitting: Physical Therapy

## 2020-12-02 ENCOUNTER — Ambulatory Visit (INDEPENDENT_AMBULATORY_CARE_PROVIDER_SITE_OTHER): Payer: Commercial Managed Care - PPO | Admitting: Physical Therapy

## 2020-12-02 DIAGNOSIS — M6281 Muscle weakness (generalized): Secondary | ICD-10-CM

## 2020-12-02 DIAGNOSIS — M25562 Pain in left knee: Secondary | ICD-10-CM

## 2020-12-02 DIAGNOSIS — M25662 Stiffness of left knee, not elsewhere classified: Secondary | ICD-10-CM | POA: Diagnosis not present

## 2020-12-02 DIAGNOSIS — Z9889 Other specified postprocedural states: Secondary | ICD-10-CM | POA: Diagnosis not present

## 2020-12-02 NOTE — Addendum Note (Signed)
Addended by: Leone Brand on: 12/02/2020 08:28 AM   Modules accepted: Orders

## 2020-12-02 NOTE — Patient Instructions (Signed)
Access Code: [email or web address] URL: [email or web address] Date: 12/02/2020 Prepared by: [facility]  Exercises Straight Leg Raise with Arm Support - 1 x daily - 7 x weekly - 2-3 sets - 10 reps Sidelying Hip Adduction - 1 x daily - 7 x weekly - 2-3 sets - 10 reps Sidelying Hip Abduction - 1 x daily - 7 x weekly - 2-3 sets - 10 reps Prone Alternating Arm and Leg Lifts - 1 x daily - 7 x weekly - 2 sets - 10 reps Seated Table Hamstring Stretch - 2 x daily - 7 x weekly - 3 sets - 2-3 reps - 30 seconds hold Prone Quadriceps Stretch with Strap - 2 x daily - 7 x weekly - 1 sets - 2-3 reps - 30 seconds hold Wall Squat - 1 x daily - 7 x weekly - 1 sets - 10 reps - 10 seconds hold Forward T - 1 x daily - 3 x weekly - 2 sets - 10 reps Prone Hamstring Curl with Ankle Weight - 1 x daily - 3 x weekly - 2 sets - 10 reps Bridge with Heels on [employer] - 1 x daily - 3 x weekly - 2 sets - 10 reps

## 2020-12-02 NOTE — Therapy (Signed)
[facility] [place] [address] [place], [place], [address] Phone: [phone]   Fax:  [phone]  Physical Therapy Treatment  Patient Details  Name: Kathryn Anderson MRN: [id number] Date of Birth: January 12, 2001 Referring Provider (PT): Rise Paganini   Encounter Date: 12/02/2020   PT End of Session - 12/02/20 1440     Visit Number 10    Number of Visits 12    Date for PT Re-Evaluation 12/18/20    Authorization Type [employer] & [employer]    PT Start Time 1434    PT Stop Time 1523    PT Time Calculation (min) 49 min    Activity Tolerance Patient tolerated treatment well    Behavior During Therapy [facility] for tasks assessed/performed             Past Medical History:  Diagnosis Date   Headache     Past Surgical History:  Procedure Laterality Date   ANTERIOR CRUCIATE LIGAMENT REPAIR Left 10/05/2020   Procedure: LEFT KNEE ANTERIOR CRUCIATE LIGAMENT (ACL) RECONSTRUCTION WITH HAMSTRING AUTOGRAFT;  Surgeon: Cammy Copa, MD;  Location: [facility];  Service: Orthopedics;  Laterality: Left;   KNEE ARTHROSCOPY WITH MENISCAL REPAIR Left 10/05/2020   Procedure: MEDIAL MENISCAL REPAIR;  Surgeon: Cammy Copa, MD;  Location: [facility];  Service: Orthopedics;  Laterality: Left;   WISDOM TOOTH EXTRACTION      There were no vitals filed for this visit.   Subjective Assessment - 12/02/20 1441     Subjective Pt reports that her Lt knee feels stiff today.She is wearing her brace on/off during work day.  She reports that she is ready for more challenging exercises.    Currently in Pain? No/denies    Pain Score 0-No pain                [facility] PT Assessment - 12/02/20 0001       Assessment   Medical Diagnosis s/p ACL reconstruction    Referring Provider (PT) Rise Paganini    Onset Date/Surgical Date 10/05/20    Hand Dominance Right    Next MD Visit 12/30/20              [facility] Adult PT  Treatment/Exercise - 12/02/20 0001       Knee/Hip Exercises: Stretches   Passive Hamstring Stretch Left;2 reps;20 seconds    Quad Stretch Left;3 reps;30 seconds    Gastroc Stretch Right;Left;1 rep;20 seconds      Knee/Hip Exercises: Aerobic   Elliptical L1 x 1.5 minutes    Recumbent Bike L1 x 4 minutes      Knee/Hip Exercises: Machines for Strengthening   Total Gym Leg Press seat#3, 5 plates x 5, 6 plates x 10, 7 plates x 10   80 deg knee flexion     Knee/Hip Exercises: Standing   Lateral Step Up Left;1 set;10 reps;Hand Hold: 1;Step Height: 6"    Lateral Step Up Limitations with contralateral hip abdct    Forward Step Up Left;10 reps;Hand Hold: 1;Step Height: 6"    Forward Step Up Limitations with contralateral knee march.      Knee/Hip Exercises: Supine   Bridges Strengthening;1 set;10 reps   legs on green pball, arms cross at chest.     Knee/Hip Exercises: Prone   Hamstring Curl 1 set;10 reps   2# on ankle     Vasopneumatic   Number Minutes Vasopneumatic  10 minutes    Vasopnuematic Location  Knee  Vasopneumatic Pressure High    Vasopneumatic Temperature  34      [employer]   [employer] Space sensitive skin rock tape applied over Lt medial knee (at area of discomfort) in x pattern with 25% stretch.  reviewed safe tape removal technique.                          PT Long Term Goals - 11/18/20 1551       PT LONG TERM GOAL #1   Title Pt will be independent with HEP    Time 6    Period Weeks    Status On-going      PT LONG TERM GOAL #2   Title Pt will improve FOTO to 67 to demo improved functional mobility    Time 6    Period Weeks    Status On-going      PT LONG TERM GOAL #3   Title Pt will improve Lt LE strength to 4+/5 to return to volleyball with decreasd pain    Time 6    Period Weeks    Status On-going      PT LONG TERM GOAL #4   Title Pt will perform gait without AD with good technique x 300' to be functional in the community    Time 6     Period Weeks    Status Achieved                   Plan - 12/02/20 1529     Clinical Impression Statement Pt tolerated all exercises well, except for mini squat with feet turned out - this caused some increased crepitus and discomfort.  Trial of sensitve skin tape application to Lt medial knee for increased proprioception. Pt progressing well for 8 wks s/p ACL repair.    PT Frequency 3x / week    PT Duration 6 weeks    PT Next Visit Plan gait/stairs, progress quad strength with closed chain exercises; progress per protocol    Consulted and Agree with Plan of Care Patient             Patient will benefit from skilled therapeutic intervention in order to improve the following deficits and impairments:  Pain, Decreased strength, Decreased activity tolerance, Decreased range of motion, Impaired sensation, Difficulty walking, Decreased balance, Abnormal gait  Visit Diagnosis: S/P ACL reconstruction  Acute pain of left knee  Muscle weakness (generalized)  Stiffness of left knee, not elsewhere classified     Problem List Patient Active Problem List   Diagnosis Date Noted   Left anterior cruciate ligament tear    Peripheral tear of lateral meniscus of left knee as current injury    Acute medial meniscus tear of left knee    Fever 04/18/2020   Headache 04/18/2020   Vancomycin adverse reaction, initial encounter 04/16/2020   Chronic migraine without aura without status migrainosus, not intractable 01/28/2020   Chronic pain of both knees 01/28/2020   Ankle ligament laxity, left 12/19/2019   Mayer Camel, PTA 12/02/20 3:41 PM   [facility] [place] [address] [place], [place], [address] Phone: [phone]   Fax:  [phone]  Name: Kathryn Anderson MRN: [id number] Date of Birth: [birth year]

## 2020-12-03 ENCOUNTER — Encounter: Payer: Commercial Managed Care - PPO | Admitting: Physical Therapy

## 2020-12-04 ENCOUNTER — Encounter: Payer: Commercial Managed Care - PPO | Admitting: Physical Therapy

## 2020-12-07 ENCOUNTER — Ambulatory Visit (INDEPENDENT_AMBULATORY_CARE_PROVIDER_SITE_OTHER): Payer: Commercial Managed Care - PPO | Admitting: Physical Therapy

## 2020-12-07 ENCOUNTER — Other Ambulatory Visit: Payer: Self-pay

## 2020-12-07 ENCOUNTER — Encounter: Payer: Self-pay | Admitting: Physical Therapy

## 2020-12-07 DIAGNOSIS — R600 Localized edema: Secondary | ICD-10-CM

## 2020-12-07 DIAGNOSIS — M25562 Pain in left knee: Secondary | ICD-10-CM

## 2020-12-07 DIAGNOSIS — M6281 Muscle weakness (generalized): Secondary | ICD-10-CM

## 2020-12-07 DIAGNOSIS — Z9889 Other specified postprocedural states: Secondary | ICD-10-CM | POA: Diagnosis not present

## 2020-12-07 NOTE — Therapy (Signed)
[facility] [place] [address] [place] [address] [place], [place], [address] Phone: [phone]   Fax:  [phone]  Physical Therapy Treatment  Patient Details  Name: Kathryn Anderson MRN: [id number] Date of Birth: 11-09-2000 Referring Provider (PT): Rise Paganini   Encounter Date: 12/07/2020   PT End of Session - 12/07/20 1115     Visit Number 11    Number of Visits 12    Date for PT Re-Evaluation 12/18/20    Authorization Type [employer] & [employer]    PT Start Time 1105    PT Stop Time 1149    PT Time Calculation (min) 44 min    Activity Tolerance Patient tolerated treatment well    Behavior During Therapy [facility] for tasks assessed/performed             Past Medical History:  Diagnosis Date   Headache     Past Surgical History:  Procedure Laterality Date   ANTERIOR CRUCIATE LIGAMENT REPAIR Left 10/05/2020   Procedure: LEFT KNEE ANTERIOR CRUCIATE LIGAMENT (ACL) RECONSTRUCTION WITH HAMSTRING AUTOGRAFT;  Surgeon: Cammy Copa, MD;  Location: [facility];  Service: Orthopedics;  Laterality: Left;   KNEE ARTHROSCOPY WITH MENISCAL REPAIR Left 10/05/2020   Procedure: MEDIAL MENISCAL REPAIR;  Surgeon: Cammy Copa, MD;  [facility];  Service: Orthopedics;  Laterality: Left;   WISDOM TOOTH EXTRACTION      There were no vitals filed for this visit.   Subjective Assessment - 12/07/20 1116     Subjective Pt reports that she walked 9000 steps yesterday.  "Wall sits are killing me, but it was good".    Patient Stated Goals Play volleyball    Currently in Pain? No/denies    Pain Score 0-No pain                [facility] PT Assessment - 12/07/20 0001       Assessment   Medical Diagnosis s/p ACL reconstruction    Referring Provider (PT) Rise Paganini    Onset Date/Surgical Date 10/05/20    Hand Dominance Right    Next MD Visit 12/30/20      [facility]   Left Knee Flexion 134                [facility] Adult PT Treatment/Exercise - 12/07/20 0001       Knee/Hip Exercises: Stretches   Passive Hamstring Stretch Left;2 reps;20 seconds   single leg long sitting   Quad Stretch Left;2 reps;20 seconds   prone with strap, towel above knee     Knee/Hip Exercises: Aerobic   Nustep L4-5 x 5 min for warm up      Knee/Hip Exercises: Machines for Strengthening   Total Gym Leg Press seat #4, 7 plates x 15 reps x 2 sets      Knee/Hip Exercises: Standing   Lateral Step Up Left;1 set;15 reps;Hand Hold: 0;Step Height: 8"   out and in (feet on opp sides of rebok step.   SLS Lt Single leg mini squat on 3" pad with opp toe touch front, side, back x 10 (required rest breaks to complete set)    SLS with Vectors the diver reaching to touch 12" step x 10 reps L side    Other Standing Knee Exercises standing Lt hamstring curls <90 x 10 with 2# on ankle.      Knee/Hip Exercises: Supine   Other Supine Knee/Hip Exercises pball pass from arms to/from legs x 10 (for core work)  Knee/Hip Exercises: Sidelying   Hip ABduction Left;1 set;15 reps   with pulses   Hip ABduction Limitations 2 lbs weight      Knee/Hip Exercises: Prone   Hamstring Curl 1 set;10 reps   2# on ankle   Other Prone Exercises forearm to/from high plank x 4 reps; primal push up x 15 sec x 2 (no kneeling into Lt knee)      Vasopneumatic   Number Minutes Vasopneumatic  10 minutes    Vasopnuematic Location  Knee    Vasopneumatic Pressure High    Vasopneumatic Temperature  34                PT Long Term Goals - 12/07/20 1144       PT LONG TERM GOAL #1   Title Pt will be independent with HEP    Time 6    Period Weeks    Status On-going      PT LONG TERM GOAL #2   Title Pt will improve FOTO to 67 to demo improved functional mobility    Time 6    Period Weeks    Status On-going      PT LONG TERM GOAL #3   Title Pt will improve Lt LE strength to 4+/5 to return to volleyball with decreasd pain    Time 6    Period  Weeks    Status On-going      PT LONG TERM GOAL #4   Title Pt will perform gait without AD with good technique x 300' to be functional in the community    Time 6    Period Weeks    Status Achieved                   Plan - 12/07/20 1145     Clinical Impression Statement Pt tolerated all exercises well, reporting decreased difficulty for resisted hamstring curl.  Lt knee flexion ROM improving.  Reminded pt to avoid planting and twisting, as well as kneeling on Lt knee.  Progressing well towards remaining goals.    PT Frequency 3x / week    PT Duration 6 weeks    PT Next Visit Plan Progress per protocol.  End of POC - PT to assess / add new LTGs. FOTO   Consulted and Agree with Plan of Care Patient             Patient will benefit from skilled therapeutic intervention in order to improve the following deficits and impairments:  Pain, Decreased strength, Decreased activity tolerance, Decreased range of motion, Impaired sensation, Difficulty walking, Decreased balance, Abnormal gait  Visit Diagnosis: S/P ACL reconstruction  Acute pain of left knee  Muscle weakness (generalized)  Localized edema     Problem List Patient Active Problem List   Diagnosis Date Noted   Left anterior cruciate ligament tear    Peripheral tear of lateral meniscus of left knee as current injury    Acute medial meniscus tear of left knee    Fever 04/18/2020   Headache 04/18/2020   Vancomycin adverse reaction, initial encounter 04/16/2020   Chronic migraine without aura without status migrainosus, not intractable 01/28/2020   Chronic pain of both knees 01/28/2020   Ankle ligament laxity, left 12/19/2019    Mayer Camel, PTA 12/07/20 11:54 AM  [facility] [place] [address] [place], [place], [address] Phone: [phone]   Fax:  [phone]  Name: Kathryn Anderson MRN: [id number] Date of Birth: 2000-12-01

## 2020-12-09 ENCOUNTER — Other Ambulatory Visit: Payer: Self-pay

## 2020-12-09 ENCOUNTER — Ambulatory Visit (INDEPENDENT_AMBULATORY_CARE_PROVIDER_SITE_OTHER): Payer: Commercial Managed Care - PPO | Admitting: Physical Therapy

## 2020-12-09 DIAGNOSIS — Z9889 Other specified postprocedural states: Secondary | ICD-10-CM

## 2020-12-09 DIAGNOSIS — M25562 Pain in left knee: Secondary | ICD-10-CM

## 2020-12-09 DIAGNOSIS — M6281 Muscle weakness (generalized): Secondary | ICD-10-CM

## 2020-12-09 DIAGNOSIS — R600 Localized edema: Secondary | ICD-10-CM | POA: Diagnosis not present

## 2020-12-09 NOTE — Therapy (Signed)
[facility] [place], [place], [address] Phone: [phone]   Fax:  [phone]  Physical Therapy Treatment and Recertification  Patient Details  Name: Kathryn Anderson MRN: [id number] Date of Birth: Oct 22, 2000 Referring Provider (PT): Marcene Duos   Encounter Date: 12/09/2020   PT End of Session - 12/09/20 1509     Visit Number 12    Number of Visits 24    Date for PT Re-Evaluation 01/20/21    PT Start Time 1430    PT Stop Time 1515    PT Time Calculation (min) 45 min    Activity Tolerance Patient tolerated treatment well    Behavior During Therapy [facility] for tasks assessed/performed             Past Medical History:  Diagnosis Date   Headache     Past Surgical History:  Procedure Laterality Date   ANTERIOR CRUCIATE LIGAMENT REPAIR Left 10/05/2020   Procedure: LEFT KNEE ANTERIOR CRUCIATE LIGAMENT (ACL) RECONSTRUCTION WITH HAMSTRING AUTOGRAFT;  Surgeon: Meredith Pel, MD;  Location: [place];  Service: Orthopedics;  Laterality: Left;   KNEE ARTHROSCOPY WITH MENISCAL REPAIR Left 10/05/2020   Procedure: MEDIAL MENISCAL REPAIR;  Surgeon: Meredith Pel, MD;  Location: [place];  Service: Orthopedics;  Laterality: Left;   WISDOM TOOTH EXTRACTION      There were no vitals filed for this visit.   Subjective Assessment - 12/09/20 1434     Subjective Pt states that she feels "a little stiff" but she has not had any "pain" lately.    Patient Stated Goals Play volleyball    Currently in Pain? Yes    Pain Score 1     Pain Location Knee    Pain Orientation Left    Pain Descriptors / Indicators --   stiffness               [facility] PT Assessment - 12/09/20 0001       Assessment   Medical Diagnosis s/p ACL reconstruction    Referring Provider (PT) Marcene Duos    Onset Date/Surgical Date 10/05/20    Hand Dominance Right    Next MD Visit 12/30/20       Observation/Other Assessments   Focus on Therapeutic Outcomes (FOTO)  63      Strength   Left Hip Flexion 4+/5    Left Hip Extension 4+/5    Left Hip ABduction 4+/5    Left Knee Flexion 4+/5    Left Knee Extension 4+/5                           [facility] Adult PT Treatment/Exercise - 12/09/20 0001       Knee/Hip Exercises: Stretches   Passive Hamstring Stretch Left;2 reps;20 seconds   single leg long sitting     Knee/Hip Exercises: Aerobic   Recumbent Bike L3 x 4 min      Knee/Hip Exercises: Standing   Step Down Left;2 sets;10 reps;Hand Hold: 1;Step Height: 4"    Step Down Limitations laterally    Wall Squat 10 reps;10 seconds    SLS Lt Single leg mini squat on 3" pad with opp toe touch front, side, back x 10    SLS with Vectors the diver reaching to touch 12" step x 10 reps L side    Rebounder SLS with 10x chest press and 10x overhead press blue med ball  Other Standing Knee Exercises standing Lt hamstring curls <90 x 10 with 2# on ankle.      Knee/Hip Exercises: Prone   Other Prone Exercises forearm plank 2 x 30 sec, primal push up 15 sec x 2      Vasopneumatic   Number Minutes Vasopneumatic  10 minutes    Vasopnuematic Location  Knee    Vasopneumatic Pressure High    Vasopneumatic Temperature  34                     PT Education - 12/09/20 1508     Education Details updated POC and goals    Person(s) Educated Patient    Methods Explanation    Comprehension Verbalized understanding                 PT Long Term Goals - 12/09/20 1440       PT LONG TERM GOAL #1   Title Pt will be independent with HEP    Status On-going    Target Date 01/20/21      PT LONG TERM GOAL #2   Title Pt will improve FOTO to 67 to demo improved functional mobility    Baseline 63 on 12/09/20    Status On-going    Target Date 01/20/21      PT LONG TERM GOAL #3   Title Pt will improve Lt LE strength to 4+/5 to return to volleyball with decreasd pain     Status Achieved      PT LONG TERM GOAL #4   Title Pt will perform gait without AD with good technique x 300' to be functional in the community    Status Achieved      PT LONG TERM GOAL #5   Title Pt will returnto volleyball with pain <= 2/10    Time 6    Period Weeks    Status New    Target Date 01/20/21                   Plan - 12/09/20 1509     Clinical Impression Statement Pt has met strength and ambulation goal, progressing towards functional mobility and return to sport goals. She is progressing well with eccentric control and balance and will benefit from skilled PT to return to work and recreational activities with decreased pain and difficulty    PT Frequency 2x / week    PT Duration 6 weeks    PT Treatment/Interventions Aquatic Therapy;Cryotherapy;Moist Heat;Iontophoresis 5m/ml Dexamethasone;Electrical Stimulation;Gait training;Stair training;Neuromuscular re-education;Balance training;Therapeutic exercise;Therapeutic activities;Patient/family education;Taping;Dry needling;Manual techniques;Vasopneumatic Device    PT Next Visit Plan progress per protocol, eccentric control, balance    PT Home Exercise Plan [id number]    Consulted and Agree with Plan of Care Patient             Patient will benefit from skilled therapeutic intervention in order to improve the following deficits and impairments:     Visit Diagnosis: S/P ACL reconstruction - Plan: PT plan of care cert/re-cert  Acute pain of left knee - Plan: PT plan of care cert/re-cert  Muscle weakness (generalized) - Plan: PT plan of care cert/re-cert  Localized edema - Plan: PT plan of care cert/re-cert     Problem List Patient Active Problem List   Diagnosis Date Noted   Left anterior cruciate ligament tear    Peripheral tear of lateral meniscus of left knee as current injury    Acute medial meniscus tear of left knee  Fever 04/18/2020   Headache 04/18/2020   Vancomycin adverse reaction,  initial encounter 04/16/2020   Chronic migraine without aura without status migrainosus, not intractable 01/28/2020   Chronic pain of both knees 01/28/2020   Ankle ligament laxity, left 12/19/2019    Shynia Daleo, PT 12/09/2020, 3:13 PM  [facility] [place], [place], [address] Phone: [phone]   Fax:  [phone]  Name: Kathryn Anderson MRN: [id number] Date of Birth: 07/01/2000

## 2020-12-10 ENCOUNTER — Encounter: Payer: Commercial Managed Care - PPO | Admitting: Physical Therapy

## 2020-12-11 ENCOUNTER — Encounter: Payer: Commercial Managed Care - PPO | Admitting: Physical Therapy

## 2020-12-16 ENCOUNTER — Ambulatory Visit (INDEPENDENT_AMBULATORY_CARE_PROVIDER_SITE_OTHER): Payer: Commercial Managed Care - PPO | Admitting: Physical Therapy

## 2020-12-16 ENCOUNTER — Other Ambulatory Visit: Payer: Self-pay

## 2020-12-16 DIAGNOSIS — M6281 Muscle weakness (generalized): Secondary | ICD-10-CM

## 2020-12-16 DIAGNOSIS — Z9889 Other specified postprocedural states: Secondary | ICD-10-CM | POA: Diagnosis not present

## 2020-12-16 DIAGNOSIS — R600 Localized edema: Secondary | ICD-10-CM

## 2020-12-16 DIAGNOSIS — M25662 Stiffness of left knee, not elsewhere classified: Secondary | ICD-10-CM

## 2020-12-16 DIAGNOSIS — M25562 Pain in left knee: Secondary | ICD-10-CM | POA: Diagnosis not present

## 2020-12-16 NOTE — Therapy (Signed)
[facility] [place], [place], [address] Phone: [phone]   Fax:  [phone]  Physical Therapy Treatment  Patient Details  Name: Kathryn Anderson MRN: [id number] Date of Birth: 10/12/2000 Referring Provider (PT): Marcene Duos   Encounter Date: 12/16/2020   PT End of Session - 12/16/20 1534     Visit Number 13    Number of Visits 24    Date for PT Re-Evaluation 01/20/21    Authorization Type [employer] & [employer]    PT Start Time 3845    PT Stop Time 1608    PT Time Calculation (min) 47 min    Activity Tolerance Patient tolerated treatment well    Behavior During Therapy [facility] for tasks assessed/performed             Past Medical History:  Diagnosis Date   Headache     Past Surgical History:  Procedure Laterality Date   ANTERIOR CRUCIATE LIGAMENT REPAIR Left 10/05/2020   Procedure: LEFT KNEE ANTERIOR CRUCIATE LIGAMENT (ACL) RECONSTRUCTION WITH HAMSTRING AUTOGRAFT;  Surgeon: Meredith Pel, MD;  Location: [place];  Service: Orthopedics;  Laterality: Left;   KNEE ARTHROSCOPY WITH MENISCAL REPAIR Left 10/05/2020   Procedure: MEDIAL MENISCAL REPAIR;  Surgeon: Meredith Pel, MD;  Location: [place];  Service: Orthopedics;  Laterality: Left;   WISDOM TOOTH EXTRACTION      There were no vitals filed for this visit.   Subjective Assessment - 12/16/20 1523     Subjective Pt reports she has been doing more at work since her boss has been out.  she had to quickly get down to floor (past 90 knee flexion) and it was painful on knee.She also states she was sitting on stage and jumped down. Lt knee pain has remained at 4/10 instead of 0-2/10.    Patient Stated Goals Play volleyball    Currently in Pain? Yes    Pain Score 4     Pain Location Knee    Pain Orientation Left    Pain Descriptors / Indicators Aching    Aggravating Factors  squatting past 90    Pain Relieving  Factors ice                [facility] PT Assessment - 12/16/20 0001       Assessment   Medical Diagnosis s/p ACL reconstruction    Referring Provider (PT) Marcene Duos    Onset Date/Surgical Date 10/05/20    Hand Dominance Right    Next MD Visit 12/30/20              [facility] Adult PT Treatment/Exercise - 12/16/20 0001       Knee/Hip Exercises: Stretches   Passive Hamstring Stretch Left;2 reps;20 seconds   single leg long sitting   Quad Stretch Left;2 reps;1 rep;20 seconds    Quad Stretch Limitations supine x 2, standing x 2    Gastroc Stretch Right;Left;2 reps;20 seconds    Other Knee/Hip Stretches standing Lt adductor stretch, holding onto counter, x 2 reps of      Knee/Hip Exercises: Aerobic   Elliptical L2: 2 min - stopped due to pain.    Recumbent Bike L2: 3 min      Knee/Hip Exercises: Standing   Functional Squat 1 set;10 reps   on upside down bosu with overhead press of 6.61# ball   SLS Lt Single leg mini squat on 3" pad with opp toe touch front, side, back x  2 reps - stopped due to pain.    SLS with Vectors the diver reaching to touch 12" step x 10 reps L side      Vasopneumatic   Number Minutes Vasopneumatic  10 minutes    Vasopnuematic Location  Knee    Vasopneumatic Pressure Medium    Vasopneumatic Temperature  34      [employer]   [employer] sensitive skin rock tape applied over Lt patellar tendon area and medial knee (at area of discomfort) in x pattern with 25% stretch.  reviewed safe tape removal technique.                          PT Long Term Goals - 12/09/20 1440       PT LONG TERM GOAL #1   Title Pt will be independent with HEP    Status On-going    Target Date 01/20/21      PT LONG TERM GOAL #2   Title Pt will improve FOTO to 67 to demo improved functional mobility    Baseline 63 on 12/09/20    Status On-going    Target Date 01/20/21      PT LONG TERM GOAL #3   Title Pt will improve Lt LE strength to 4+/5 to return to  volleyball with decreasd pain    Status Achieved      PT LONG TERM GOAL #4   Title Pt will perform gait without AD with good technique x 300' to be functional in the community    Status Achieved      PT LONG TERM GOAL #5   Title Pt will returnto volleyball with pain <= 2/10    Time 6    Period Weeks    Status New    Target Date 01/20/21                   Plan - 12/16/20 1535     Clinical Impression Statement Pt reported increased Lt knee pain with eliptical (at 2 min) and single leg mini squat; exercises stopped and pain returned to 4/10.  All other exercises tolerated well, and she reported some decrease in pain with quad stretch.  Encouraged pt to perform quad stretch on a daily basis instead of 2-4x/wk.Reminded pt to dial back exercises, avoid jumping and loading knee past 90, as well as inncrease icing to knee to calm pain back down to baseline. No goals met this visit.    PT Frequency 2x / week    PT Duration 6 weeks    PT Treatment/Interventions Aquatic Therapy;Cryotherapy;Moist Heat;Iontophoresis 74m/ml Dexamethasone;Electrical Stimulation;Gait training;Stair training;Neuromuscular re-education;Balance training;Therapeutic exercise;Therapeutic activities;Patient/family education;Taping;Dry needling;Manual techniques;Vasopneumatic Device    PT Next Visit Plan progress per protocol (pt now 10 wks s/p ACL) , eccentric control, balance    PT Home Exercise Plan [id number]    Consulted and Agree with Plan of Care Patient             Patient will benefit from skilled therapeutic intervention in order to improve the following deficits and impairments:  Pain, Decreased strength, Decreased activity tolerance, Decreased range of motion, Impaired sensation, Difficulty walking, Decreased balance, Abnormal gait  Visit Diagnosis: S/P ACL reconstruction  Acute pain of left knee  Muscle weakness (generalized)  Localized edema  Stiffness of left knee, not elsewhere  classified     Problem List Patient Active Problem List   Diagnosis Date Noted   Left anterior cruciate ligament tear  Peripheral tear of lateral meniscus of left knee as current injury    Acute medial meniscus tear of left knee    Fever 04/18/2020   Headache 04/18/2020   Vancomycin adverse reaction, initial encounter 04/16/2020   Chronic migraine without aura without status migrainosus, not intractable 01/28/2020   Chronic pain of both knees 01/28/2020   Ankle ligament laxity, left 12/19/2019   Kerin Perna, PTA 12/16/20 5:10 PM   [facility] [place], [place], [address] Phone: [phone]   Fax:  [phone]  Name: Kathryn Anderson MRN: [id number] Date of Birth: [birth year]

## 2020-12-23 ENCOUNTER — Ambulatory Visit (INDEPENDENT_AMBULATORY_CARE_PROVIDER_SITE_OTHER): Payer: Commercial Managed Care - PPO | Admitting: Physical Therapy

## 2020-12-23 ENCOUNTER — Encounter: Payer: Self-pay | Admitting: Physical Therapy

## 2020-12-23 ENCOUNTER — Other Ambulatory Visit: Payer: Self-pay

## 2020-12-23 DIAGNOSIS — M25562 Pain in left knee: Secondary | ICD-10-CM | POA: Diagnosis not present

## 2020-12-23 DIAGNOSIS — M6281 Muscle weakness (generalized): Secondary | ICD-10-CM | POA: Diagnosis not present

## 2020-12-23 DIAGNOSIS — Z9889 Other specified postprocedural states: Secondary | ICD-10-CM | POA: Diagnosis not present

## 2020-12-23 DIAGNOSIS — R600 Localized edema: Secondary | ICD-10-CM | POA: Diagnosis not present

## 2020-12-23 NOTE — Therapy (Signed)
[facility] [place] [address] [place], [place], [address] Phone: [phone]   Fax:  [phone]  Physical Therapy Treatment  Patient Details  Name: Kathryn Anderson MRN: [id number] Date of Birth: 15-Oct-2000 Referring Provider (PT): Rise Paganini   Encounter Date: 12/23/2020   PT End of Session - 12/23/20 1558     Visit Number 14    Number of Visits 24    Date for PT Re-Evaluation 01/20/21    Authorization Type [employer] & [employer]    PT Start Time 1517    PT Stop Time 1603    PT Time Calculation (min) 46 min    Activity Tolerance Patient tolerated treatment well    Behavior During Therapy [facility] for tasks assessed/performed             Past Medical History:  Diagnosis Date   Headache     Past Surgical History:  Procedure Laterality Date   ANTERIOR CRUCIATE LIGAMENT REPAIR Left 10/05/2020   Procedure: LEFT KNEE ANTERIOR CRUCIATE LIGAMENT (ACL) RECONSTRUCTION WITH HAMSTRING AUTOGRAFT;  Surgeon: Cammy Copa, MD;  Location: [facility];  Service: Orthopedics;  Laterality: Left;   KNEE ARTHROSCOPY WITH MENISCAL REPAIR Left 10/05/2020   Procedure: MEDIAL MENISCAL REPAIR;  Surgeon: Cammy Copa, MD;  Location: [facility];  Service: Orthopedics;  Laterality: Left;   WISDOM TOOTH EXTRACTION      There were no vitals filed for this visit.   Subjective Assessment - 12/23/20 1519     Subjective Pt reports that she dialed back her exercises last week.  Pain has gone down.  Biggest compaint is stiffness in Lt knee.  She would like to be retaped.    Pertinent History history of dislocated knee cap x 2 on Lt    Patient Stated Goals Play volleyball    Currently in Pain? No/denies    Pain Score 0-No pain                [facility] PT Assessment - 12/23/20 0001       Assessment   Medical Diagnosis s/p ACL reconstruction    Referring Provider (PT) Rise Paganini    Onset Date/Surgical Date  10/05/20    Hand Dominance Right    Next MD Visit 12/30/20               [facility] Adult PT Treatment/Exercise - 12/23/20 0001       Knee/Hip Exercises: Stretches   Passive Hamstring Stretch Left;3 reps;20 seconds    Quad Stretch Left;3 reps;20 seconds   standing   Gastroc Stretch Left;2 reps;20 seconds      Knee/Hip Exercises: Aerobic   Recumbent Bike L3: 5 min for warm up.      Knee/Hip Exercises: Machines for Strengthening   Total Gym Leg Press seat #4, 7 plates x 15 reps, 8 plates x 15 reps BLE.      Knee/Hip Exercises: Standing   Heel Raises 1 set;10 rep, LLE   Forward Step Up Left;2 sets    Forward Step Up Limitations 8" x 5 reps, 10" x 10 with UE support.    SLS Lt Single leg mini squat on 3" pad with opp toe touch front, side, back x 10    Rebounder SLS with 10x chest press and 10x overhead press blue med ball    Other Standing Knee Exercises standing Lt hamstring curls <90 x 15 with 3# on ankle.    Other Standing Knee Exercises side to  side lunge (with hip hinge) x 10 each side.      Knee/Hip Exercises: Seated   Long Arc Quad Left;Strengthening;1 set; 8reps    Long Arc [employer] 3 lbs.    Long [employer] Limitations some clicking after 60 - stopped      Vasopneumatic   Number Minutes Vasopneumatic  10 minutes    Vasopnuematic Location  Knee    Vasopneumatic Pressure Medium    Vasopneumatic Temperature  34      Manual Therapy   Manual Therapy Myofascial release    Myofascial Release MFR to lateral distal L quad to midthigh to decrease fascial restrictions and improve mobility    [employer] IT consultant sensitive skin rock tape applied over Lt patellar tendon area and medial knee (at area of discomfort) in x pattern with 25% stretch.  .                PT Long Term Goals - 12/09/20 1440       PT LONG TERM GOAL #1   Title Pt will be independent with HEP    Status On-going    Target Date 01/20/21      PT LONG TERM  GOAL #2   Title Pt will improve FOTO to 67 to demo improved functional mobility    Baseline 63 on 12/09/20    Status On-going    Target Date 01/20/21      PT LONG TERM GOAL #3   Title Pt will improve Lt LE strength to 4+/5 to return to volleyball with decreasd pain    Status Achieved      PT LONG TERM GOAL #4   Title Pt will perform gait without AD with good technique x 300' to be functional in the community    Status Achieved      PT LONG TERM GOAL #5   Title Pt will returnto volleyball with pain <= 2/10    Time 6    Period Weeks    Status New    Target Date 01/20/21                   Plan - 12/23/20 1545     Clinical Impression Statement Pt's pain in Lt knee has returned to 0/10.  She was able to tolerate exercises well today, with some fatigue, but minimal discomfort in knee.  She has some palpable tightness in Lt distal to mid quad muscle; responded well to MFR to area. would benefit from more STM to area.  Progressing well towards goals.    PT Frequency 2x / week    PT Duration 6 weeks    PT Treatment/Interventions Aquatic Therapy;Cryotherapy;Moist Heat;Iontophoresis 4mg /ml Dexamethasone;Electrical Stimulation;Gait training;Stair training;Neuromuscular re-education;Balance training;Therapeutic exercise;Therapeutic activities;Patient/family education;Taping;Dry needling;Manual techniques;Vasopneumatic Device    PT Next Visit Plan progress per protocol (pt now 11 wks s/p ACL) , eccentric control, balance. STM to Lt quad.  MD note (measure knee flexion)    PT Home Exercise Plan [id number]    Consulted and Agree with Plan of Care Patient             Patient will benefit from skilled therapeutic intervention in order to improve the following deficits and impairments:  Pain, Decreased strength, Decreased activity tolerance, Decreased range of motion, Impaired sensation, Difficulty walking, Decreased balance, Abnormal gait  Visit Diagnosis: S/P ACL  reconstruction  Acute pain of left knee  Muscle weakness (generalized)  Localized edema  Problem List Patient Active Problem List   Diagnosis Date Noted   Left anterior cruciate ligament tear    Peripheral tear of lateral meniscus of left knee as current injury    Acute medial meniscus tear of left knee    Fever 04/18/2020   Headache 04/18/2020   Vancomycin adverse reaction, initial encounter 04/16/2020   Chronic migraine without aura without status migrainosus, not intractable 01/28/2020   Chronic pain of both knees 01/28/2020   Ankle ligament laxity, left 12/19/2019   Mayer Camel, PTA 12/23/20 4:01 PM  [facility] [place] [address] [place], [place], [address] Phone: [phone]   Fax:  [phone]  Name: Kathryn Anderson MRN: [id number] Date of Birth: Mar 28, 2000

## 2020-12-25 ENCOUNTER — Other Ambulatory Visit: Payer: Self-pay

## 2020-12-25 ENCOUNTER — Ambulatory Visit (INDEPENDENT_AMBULATORY_CARE_PROVIDER_SITE_OTHER): Payer: Commercial Managed Care - PPO | Admitting: Physical Therapy

## 2020-12-25 DIAGNOSIS — M6281 Muscle weakness (generalized): Secondary | ICD-10-CM | POA: Diagnosis not present

## 2020-12-25 DIAGNOSIS — Z9889 Other specified postprocedural states: Secondary | ICD-10-CM | POA: Diagnosis not present

## 2020-12-25 DIAGNOSIS — R600 Localized edema: Secondary | ICD-10-CM

## 2020-12-25 DIAGNOSIS — M25562 Pain in left knee: Secondary | ICD-10-CM

## 2020-12-25 NOTE — Therapy (Signed)
[facility] [place] [address] [place], [place], [address] Phone: [phone]   Fax:  [phone]  Physical Therapy Treatment  Patient Details  Name: Kathryn Anderson MRN: [id number] Date of Birth: Jun 29, 2000 Referring Provider (PT): Rise Paganini   Encounter Date: 12/25/2020   PT End of Session - 12/25/20 1611     Visit Number 15    Number of Visits 24    Date for PT Re-Evaluation 01/20/21    Authorization Type [employer] & [employer]    PT Start Time 1526    PT Stop Time 1615    PT Time Calculation (min) 49 min    Activity Tolerance Patient tolerated treatment well    Behavior During Therapy [facility] for tasks assessed/performed             Past Medical History:  Diagnosis Date   Headache     Past Surgical History:  Procedure Laterality Date   ANTERIOR CRUCIATE LIGAMENT REPAIR Left 10/05/2020   Procedure: LEFT KNEE ANTERIOR CRUCIATE LIGAMENT (ACL) RECONSTRUCTION WITH HAMSTRING AUTOGRAFT;  Surgeon: Cammy Copa, MD;  Location: [facility];  Service: Orthopedics;  Laterality: Left;   KNEE ARTHROSCOPY WITH MENISCAL REPAIR Left 10/05/2020   Procedure: MEDIAL MENISCAL REPAIR;  Surgeon: Cammy Copa, MD;  Location: [facility];  Service: Orthopedics;  Laterality: Left;   WISDOM TOOTH EXTRACTION      There were no vitals filed for this visit.   Subjective Assessment - 12/25/20 1529     Subjective Pt states she had a friend hug her and she shifted her weight and twisted her LT knee and felt a "pop". Since then pt has had increased pain and walks into clinic with antalgic gait today    Patient Stated Goals Play volleyball    Currently in Pain? Yes    Pain Score 5     Pain Location Knee    Pain Orientation Left    Pain Descriptors / Indicators Aching                [facility] PT Assessment - 12/25/20 0001       Assessment   Medical Diagnosis s/p ACL reconstruction    Referring Provider  (PT) Rise Paganini    Onset Date/Surgical Date 10/05/20    Hand Dominance Right    Next MD Visit 12/30/20      AROM   Left Knee Flexion 132      Strength   Left Knee Flexion 4+/5      Special Tests   Other special tests some tenderness in Lt medial knee with posterior drawer and anterior drawer                           [facility] Adult PT Treatment/Exercise - 12/25/20 0001       Knee/Hip Exercises: Stretches   Passive Hamstring Stretch Left;3 reps;20 seconds      Knee/Hip Exercises: Aerobic   Recumbent Bike L3: 5 min for warm up.      Knee/Hip Exercises: Machines for Strengthening   Total Gym Leg Press 8 plates x 15 reps double leg, 6 plates x 10 single leg      Knee/Hip Exercises: Standing   Rebounder standing on BOSU x 15 with blue med ball      Knee/Hip Exercises: Seated   Long Arc Quad Left;15 reps;2 sets    [employer] Weight 3 lbs.    Long  Arc Quad Limitations decreased clicking this session, no pain    Stool Scoot - Round Trips single leg x 1 lap      Knee/Hip Exercises: Supine   Bridges 10 reps;2 sets    Bridges Limitations with LEs on green physioball with HS curl    Single Leg Bridge Left;10 reps    Straight Leg Raises Left;15 reps;3 sets    Straight Leg Raises Limitations 2#      Knee/Hip Exercises: Sidelying   Hip ABduction Strengthening;Left;3 sets;15 reps    Hip ABduction Limitations 2#    Hip ADduction 10 reps    Hip ADduction Limitations no weight due to pain      Knee/Hip Exercises: Prone   Straight Leg Raises 15 reps;3 sets    Straight Leg Raises Limitations 2#      Vasopneumatic   Number Minutes Vasopneumatic  10 minutes    Vasopnuematic Location  Knee    Vasopneumatic Pressure Medium    Vasopneumatic Temperature  34                          PT Long Term Goals - 12/09/20 1440       PT LONG TERM GOAL #1   Title Pt will be independent with HEP    Status On-going    Target Date 01/20/21      PT LONG  TERM GOAL #2   Title Pt will improve FOTO to 67 to demo improved functional mobility    Baseline 63 on 12/09/20    Status On-going    Target Date 01/20/21      PT LONG TERM GOAL #3   Title Pt will improve Lt LE strength to 4+/5 to return to volleyball with decreasd pain    Status Achieved      PT LONG TERM GOAL #4   Title Pt will perform gait without AD with good technique x 300' to be functional in the community    Status Achieved      PT LONG TERM GOAL #5   Title Pt will returnto volleyball with pain <= 2/10    Time 6    Period Weeks    Status New    Target Date 01/20/21                   Plan - 12/25/20 1611     Clinical Impression Statement Session modified due to increased pain with wt bearing and antalgic gait today. Session focused on NWB strengthening for hips, quads and hamstrings. Pt with good tolerance to single leg bridges and single leg leg press. Plan to progress wt bearing balance and proprioception next visit as tolerated    PT Next Visit Plan protocol, eccentric control, balance/proprioception    PT Home Exercise Plan [id number]    Consulted and Agree with Plan of Care Patient             Patient will benefit from skilled therapeutic intervention in order to improve the following deficits and impairments:     Visit Diagnosis: S/P ACL reconstruction  Acute pain of left knee  Muscle weakness (generalized)  Localized edema     Problem List Patient Active Problem List   Diagnosis Date Noted   Left anterior cruciate ligament tear    Peripheral tear of lateral meniscus of left knee as current injury    Acute medial meniscus tear of left knee    Fever 04/18/2020   Headache 04/18/2020  Vancomycin adverse reaction, initial encounter 04/16/2020   Chronic migraine without aura without status migrainosus, not intractable 01/28/2020   Chronic pain of both knees 01/28/2020   Ankle ligament laxity, left 12/19/2019    Brynja Marker,  PT 12/25/2020, 4:20 PM  [facility] [address] [place], [place], [address] Phone: [phone]   Fax:  [phone]  Name: Kathryn Anderson MRN: [id number] Date of Birth: [birth year]

## 2020-12-30 ENCOUNTER — Encounter: Payer: Self-pay | Admitting: Orthopedic Surgery

## 2020-12-30 ENCOUNTER — Other Ambulatory Visit: Payer: Self-pay

## 2020-12-30 ENCOUNTER — Ambulatory Visit (INDEPENDENT_AMBULATORY_CARE_PROVIDER_SITE_OTHER): Payer: Commercial Managed Care - PPO | Admitting: Surgical

## 2020-12-30 DIAGNOSIS — Z9889 Other specified postprocedural states: Secondary | ICD-10-CM

## 2020-12-30 NOTE — Progress Notes (Addendum)
Post-Op Visit Note   Patient: Kathryn Anderson           Date of Birth: 2000/03/16           MRN: [id number] Visit Date: 12/30/2020 PCP: Lavada Mesi, MD   Assessment & Plan:  Chief Complaint:  Chief Complaint  Patient presents with   Left Knee - Routine Post Op    10/05/20 (12w 2d)  Left Knee Anterior Cruciate Ligament (acl) Reconstruction With Hamstring Autograft - Left  Medial Meniscal Repair - Left      Visit Diagnoses:  1. S/P ACL reconstruction     Plan: Patient is a 20 year old female who presents s/p left knee anterior cruciate ligament reconstruction with hamstring autograft as well as left knee inside-out medial meniscal repair with all inside lateral meniscal repair as well.  She reports 0/10 pain with occasional increase in pain that she describes as an aching sensation.  She wears her knee brace daily when she is out but not really much at home.  She is taking ibuprofen for pain control.  Only taking as needed.  She is going to physical therapy 1 time per week with home exercise program 2-3 times per week.  She did note a popping sensation in her knee about a week ago but she had no significant lasting swelling following this and no significant last increase in pain or any new mechanical symptoms/instability.  On exam patient has no effusion.  0 degrees extension and 120 degrees of knee flexion.  Incisions are well-healed from prior surgery.  She has no calf tenderness.  Negative Homans' sign.  Able to perform straight leg raise with 5/5 quadricep strength.  5 -/5 hamstring strength.  Moderate medial joint line tenderness to palpation with mild lateral joint line tenderness.  Plan today is to continue with physical therapy.  Plan to reup her physical therapy prescription for [facility] physical therapy.  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  She will follow-up with the office 2 to 3 weeks after that date for clinical recheck to see how work is going.  She is okay to return to swimming as long as it is not breaststroke.  Recommended stationary bike for cardio exercise side from swimming.  No running/jumping/cutting/pivoting/sporting activities yet.  No loaded knee flexion past 90 degrees still.  Encouraging that she has no effusion and expect that her joint line pain and tenderness will gradually improve.  Follow-Up Instructions: No follow-ups on file.   Orders:  Orders Placed This Encounter  Procedures   Ambulatory referral to Physical Therapy   No orders of the defined types were placed in this encounter.   Imaging: No results found.  PMFS History: Patient Active Problem List   Diagnosis Date Noted   Left anterior cruciate ligament tear    Peripheral tear of lateral meniscus of left knee as current injury    Acute medial meniscus tear of left knee    Fever 04/18/2020   Headache 04/18/2020   Vancomycin adverse reaction, initial encounter 04/16/2020   Chronic migraine without aura without status migrainosus, not intractable 01/28/2020   Chronic pain of both knees 01/28/2020   Ankle ligament laxity, left 12/19/2019   Past  Medical History:  Diagnosis Date   Headache     Family History  Problem Relation Age of Onset   Healthy Mother    Healthy Father    Macular degeneration Maternal Grandmother    Glaucoma Maternal Grandmother    Dementia Paternal Grandfather    Heart disease Neg Hx     Past Surgical History:  Procedure Laterality Date   ANTERIOR CRUCIATE LIGAMENT REPAIR Left 10/05/2020   Procedure: LEFT KNEE ANTERIOR CRUCIATE LIGAMENT (ACL) RECONSTRUCTION WITH HAMSTRING AUTOGRAFT;  Surgeon: Cammy Copa, MD;  Location: [facility];  Service: Orthopedics;   Laterality: Left;   KNEE ARTHROSCOPY WITH MENISCAL REPAIR Left 10/05/2020   Procedure: MEDIAL MENISCAL REPAIR;  Surgeon: Cammy Copa, MD;  Location: [facility];  Service: Orthopedics;  Laterality: Left;   WISDOM TOOTH EXTRACTION     Social History   Occupational History   Not on file  Tobacco Use   Smoking status: Never   Smokeless tobacco: Never  Vaping Use   Vaping Use: Never used  Substance and Sexual Activity   Alcohol use: Never   Drug use: Never   Sexual activity: Not on file

## 2020-12-31 ENCOUNTER — Ambulatory Visit (INDEPENDENT_AMBULATORY_CARE_PROVIDER_SITE_OTHER): Payer: Commercial Managed Care - PPO | Admitting: Physical Therapy

## 2020-12-31 ENCOUNTER — Encounter: Payer: Self-pay | Admitting: Physical Therapy

## 2020-12-31 DIAGNOSIS — M25562 Pain in left knee: Secondary | ICD-10-CM

## 2020-12-31 DIAGNOSIS — M6281 Muscle weakness (generalized): Secondary | ICD-10-CM

## 2020-12-31 DIAGNOSIS — Z9889 Other specified postprocedural states: Secondary | ICD-10-CM | POA: Diagnosis not present

## 2020-12-31 NOTE — Therapy (Signed)
[facility] [place] [address] [place], [place], [address] Phone: [phone]   Fax:  [phone]  Physical Therapy Treatment  Patient Details  Name: Kathryn Anderson MRN: [id number] Date of Birth: [birth year] Referring Provider (PT): Rise Paganini   Encounter Date: 12/31/2020   PT End of Session - 12/31/20 1509     Visit Number 16    Number of Visits 24    Date for PT Re-Evaluation 01/20/21    Authorization Type [employer] & [employer]    PT Start Time 1504    PT Stop Time 1545    PT Time Calculation (min) 41 min    Activity Tolerance Patient tolerated treatment well    Behavior During Therapy [facility] for tasks assessed/performed             Past Medical History:  Diagnosis Date   Headache     Past Surgical History:  Procedure Laterality Date   ANTERIOR CRUCIATE LIGAMENT REPAIR Left 10/05/2020   Procedure: LEFT KNEE ANTERIOR CRUCIATE LIGAMENT (ACL) RECONSTRUCTION WITH HAMSTRING AUTOGRAFT;  Surgeon: Cammy Copa, MD;  Location: [facility];  Service: Orthopedics;  Laterality: Left;   KNEE ARTHROSCOPY WITH MENISCAL REPAIR Left 10/05/2020   Procedure: MEDIAL MENISCAL REPAIR;  Surgeon: Cammy Copa, MD;  [facility];  Service: Orthopedics;  Laterality: Left;   WISDOM TOOTH EXTRACTION      There were no vitals filed for this visit.   Subjective Assessment - 12/31/20 1505     Subjective Pt reports she saw the PA at surgeon's office.  She is still not allowed to run, jump, pivot, or cut.  She is now allowed to swim with flutter kick and kneel (within tolerance).  She is to be careful when bending past 90 when weight bearing.   She has no pain, just stiffness.                [facility] PT Assessment - 12/31/20 0001       Assessment   Medical Diagnosis s/p ACL reconstruction    Referring Provider (PT) Rise Paganini    Onset Date/Surgical Date 10/05/20    Hand Dominance Right     Next MD Visit 2 months from Oct appt      Strength   Left Knee Extension 4+/5               [facility] Adult PT Treatment/Exercise - 12/31/20 0001       Knee/Hip Exercises: Stretches   Passive Hamstring Stretch Left;3 reps;20 seconds   standing   Quad Stretch Left;3 reps;20 seconds   standing   Gastroc Stretch Left;1 rep;20 seconds      Knee/Hip Exercises: Aerobic   Recumbent Bike L3-1: 5 min for warm up.      Knee/Hip Exercises: Machines for Strengthening   Total Gym Leg Press 8 plates x 15 reps BLE, 2 sets, 6 plates x 10 single leg R/L   seat #3   Other Machine fitter with blue/black band, pushing foot back for hamstring glute engagement x 15 each leg.      Knee/Hip Exercises: Standing   Lateral Step Up Left;2 sets;10 reps;Hand Hold: 1;Step Height: 4";Step Height: 6"   Rt heel taps   SLS Lt single leg forward leans to touch chair, holding 5 sec in extension    Other Standing Knee Exercises Fitter with side to side motion with blue/black band.  Lt hamstring curl with 5# x 10    Other  Standing Knee Exercises side to side lunge (with hip hinge) x 5 each side.      Knee/Hip Exercises: Seated   Long Arc Quad Left;2 sets;10 reps    Long Arc [employer] --   4, 5   Long Arc Quad Limitations 2nd set with 5 sec hold and eccentric lowering.    Stool Scoot - Round Trips 80 ft lap with alternating legs, then single LLE x 80 ft lap.      Kinesiotix   Create Space sensitive skin rock tape applied over Lt medial knee to patellar tendon area and over medial incision to incision near tibial tuberosity) with 20% stretch.  .                PT Long Term Goals - 12/09/20 1440       PT LONG TERM GOAL #1   Title Pt will be independent with HEP    Status On-going    Target Date 01/20/21      PT LONG TERM GOAL #2   Title Pt will improve FOTO to 67 to demo improved functional mobility    Baseline 63 on 12/09/20    Status On-going    Target Date 01/20/21      PT LONG TERM GOAL #3    Title Pt will improve Lt LE strength to 4+/5 to return to volleyball with decreasd pain    Status Achieved      PT LONG TERM GOAL #4   Title Pt will perform gait without AD with good technique x 300' to be functional in the community    Status Achieved      PT LONG TERM GOAL #5   Title Pt will returnto volleyball with pain <= 2/10    Time 6    Period Weeks    Status New    Target Date 01/20/21                   Plan - 12/31/20 1601     Clinical Impression Statement Pt recovered from flare up last week.  She tolerated increased resistance for hamstring curls, LAQ, and single leg partial squats.  LLE strength improving gradually.  Progressing towards LTGs.    PT Next Visit Plan protocol, eccentric control, balance/proprioception    PT Home Exercise Plan [id number]    Consulted and Agree with Plan of Care Patient             Patient will benefit from skilled therapeutic intervention in order to improve the following deficits and impairments:  Pain, Decreased strength, Decreased activity tolerance, Decreased range of motion, Impaired sensation, Difficulty walking, Decreased balance, Abnormal gait  Visit Diagnosis: S/P ACL reconstruction  Acute pain of left knee  Muscle weakness (generalized)     Problem List Patient Active Problem List   Diagnosis Date Noted   Left anterior cruciate ligament tear    Peripheral tear of lateral meniscus of left knee as current injury    Acute medial meniscus tear of left knee    Fever 04/18/2020   Headache 04/18/2020   Vancomycin adverse reaction, initial encounter 04/16/2020   Chronic migraine without aura without status migrainosus, not intractable 01/28/2020   Chronic pain of both knees 01/28/2020   Ankle ligament laxity, left 12/19/2019   Mayer Camel, PTA 12/31/20 4:14 PM  [facility] [place] [address] [place] [place], [place], [address] Phone: [phone]    Fax:  [phone]  Name: Kathryn Anderson MRN: [id number]  Date of Birth: Nov 15, 2000

## 2021-01-07 ENCOUNTER — Encounter: Payer: Self-pay | Admitting: Physical Therapy

## 2021-01-07 ENCOUNTER — Ambulatory Visit (INDEPENDENT_AMBULATORY_CARE_PROVIDER_SITE_OTHER): Payer: Commercial Managed Care - PPO | Admitting: Physical Therapy

## 2021-01-07 ENCOUNTER — Other Ambulatory Visit: Payer: Self-pay

## 2021-01-07 DIAGNOSIS — Z9889 Other specified postprocedural states: Secondary | ICD-10-CM | POA: Diagnosis not present

## 2021-01-07 DIAGNOSIS — M25562 Pain in left knee: Secondary | ICD-10-CM

## 2021-01-07 DIAGNOSIS — M6281 Muscle weakness (generalized): Secondary | ICD-10-CM

## 2021-01-07 NOTE — Therapy (Signed)
[facility] [place] [address] [place] [address] [place], [place], [address] Phone: [phone]   Fax:  [phone]  Physical Therapy Treatment  Patient Details  Name: Kathryn Anderson MRN: [id number] Date of Birth: October 23, 2000 Referring Provider (PT): Rise Paganini   Encounter Date: 01/07/2021   PT End of Session - 01/07/21 1532     Visit Number 17    Number of Visits 24    Date for PT Re-Evaluation 01/20/21    Authorization Type [employer] & [employer]    PT Start Time 1531    PT Stop Time 1612    PT Time Calculation (min) 41 min    Activity Tolerance Patient tolerated treatment well    Behavior During Therapy [facility] for tasks assessed/performed             Past Medical History:  Diagnosis Date   Headache     Past Surgical History:  Procedure Laterality Date   ANTERIOR CRUCIATE LIGAMENT REPAIR Left 10/05/2020   Procedure: LEFT KNEE ANTERIOR CRUCIATE LIGAMENT (ACL) RECONSTRUCTION WITH HAMSTRING AUTOGRAFT;  Surgeon: Cammy Copa, MD;  Location: [facility];  Service: Orthopedics;  Laterality: Left;   KNEE ARTHROSCOPY WITH MENISCAL REPAIR Left 10/05/2020   Procedure: MEDIAL MENISCAL REPAIR;  Surgeon: Cammy Copa, MD;  Location: [facility];  Service: Orthopedics;  Laterality: Left;   WISDOM TOOTH EXTRACTION      There were no vitals filed for this visit.   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  She states she is getting 10-12k steps per day.    Pertinent History history of dislocated knee cap x 2 on Lt    Patient Stated Goals Play volleyball    Currently in Pain? No/denies   just very stiff.   Pain Score 0-No pain                [facility] PT Assessment - 01/07/21 0001       Assessment   Medical Diagnosis s/p ACL reconstruction    Referring Provider (PT)  Rise Paganini    Onset Date/Surgical Date 10/05/20    Hand Dominance Right    Next MD Visit to be scheduled in Dec.               [facility] Adult PT Treatment/Exercise - 01/07/21 0001       Knee/Hip Exercises: Stretches   Passive Hamstring Stretch Left;2 reps;30 seconds    Quad Stretch Left;3 reps;20 seconds   standing   Gastroc Stretch Left;20 seconds;2 reps      Knee/Hip Exercises: Aerobic   Elliptical L3: 1 min forward, 1 min backward    Recumbent Bike L3: 2 min for warm up      Knee/Hip Exercises: Machines for Strengthening   Total Gym Leg Press 8 plates x 15 reps BLE, 7 plates x 10 single leg L   seat #3   Other Machine fitter with blue/black band, pushing foot back for hamstring glute engagement x 15 each leg.      Knee/Hip Exercises: Standing   Heel Raises Limitations sustained heel raise with overhead setting of orange pball x 30 sec x 2 reps    Side Lunges Right;Left;1 set;10 reps   touching treadmill base   Lateral Step Up Left;2 sets;10 reps;Step Height: 8"   2nd set on Bosu   SLS Lt single  leg sit to/ from stand on NuStep seate    Other Standing Knee Exercises walking mini split squats x 25 ft x 2 with torso twist holding 6# ball      Knee/Hip Exercises: Seated   Stool Scoot - Round Trips 90 ft with LLE only (improved speed and coordination)      Knee/Hip Exercises: Supine   Other Supine Knee/Hip Exercises pball pass from arms to/from legs x 10 (for core work)      Knee/Hip Exercises: Sidelying   Other Sidelying Knee/Hip Exercises side plank on forearm x 15-20 reps each side.      Kinesiotix   Create Space sensitive skin rock tape applied over Lt medial knee to patellar tendon area and over medial incision to incision near tibial tuberosity) with 20% stretch.  .X strip applied to mid-distal Lt rectus femoris in area of tightness to decompress tissue.                          PT Long Term Goals - 12/09/20 1440       PT LONG TERM GOAL #1    Title Pt will be independent with HEP    Status On-going    Target Date 01/20/21      PT LONG TERM GOAL #2   Title Pt will improve FOTO to 67 to demo improved functional mobility    Baseline 63 on 12/09/20    Status On-going    Target Date 01/20/21      PT LONG TERM GOAL #3   Title Pt will improve Lt LE strength to 4+/5 to return to volleyball with decreasd pain    Status Achieved      PT LONG TERM GOAL #4   Title Pt will perform gait without AD with good technique x 300' to be functional in the community    Status Achieved      PT LONG TERM GOAL #5   Title Pt will returnto volleyball with pain <= 2/10    Time 6    Period Weeks    Status New    Target Date 01/20/21                   Plan - 01/07/21 1613     Clinical Impression Statement Pt able to tolerate exercises for LLE with increased difficulty without any problem.  Lt quad continues to be tremulous with step downs and single leg mini squats.  Pt has some tightness in mid- Lt quad; MFR and sensitive skin rock tape applied to area.  Progressing well towards remaining goals.    Rehab Potential Good    PT Frequency 2x / week    PT Treatment/Interventions Aquatic Therapy;Cryotherapy;Moist Heat;Iontophoresis 4mg /ml Dexamethasone;Electrical Stimulation;Gait training;Stair training;Neuromuscular re-education;Balance training;Therapeutic exercise;Therapeutic activities;Patient/family education;Taping;Dry needling;Manual techniques;Vasopneumatic Device    PT Next Visit Plan protocol, eccentric control, balance/proprioception.  FOTO    PT Home Exercise Plan [id number]    Consulted and Agree with Plan of Care Patient             Patient will benefit from skilled therapeutic intervention in order to improve the following deficits and impairments:  Pain, Decreased strength, Decreased activity tolerance, Decreased range of motion, Impaired sensation, Difficulty walking, Decreased balance, Abnormal gait  Visit Diagnosis: S/P  ACL reconstruction  Acute pain of left knee  Muscle weakness (generalized)     Problem List Patient Active Problem List   Diagnosis Date Noted   Left anterior  cruciate ligament tear    Peripheral tear of lateral meniscus of left knee as current injury    Acute medial meniscus tear of left knee    Fever 04/18/2020   Headache 04/18/2020   Vancomycin adverse reaction, initial encounter 04/16/2020   Chronic migraine without aura without status migrainosus, not intractable 01/28/2020   Chronic pain of both knees 01/28/2020   Ankle ligament laxity, left 12/19/2019   Mayer Camel, PTA 01/07/21 4:17 PM  [facility] [place] [address] [place] [address] [place], [place], [address] Phone: [phone]   Fax:  [phone]  Name: Keniesha Adderly MRN: [id number] Date of Birth: January 20, 2001

## 2021-01-14 ENCOUNTER — Other Ambulatory Visit: Payer: Self-pay

## 2021-01-14 ENCOUNTER — Encounter: Payer: Self-pay | Admitting: Physical Therapy

## 2021-01-14 ENCOUNTER — Ambulatory Visit (INDEPENDENT_AMBULATORY_CARE_PROVIDER_SITE_OTHER): Payer: Commercial Managed Care - PPO | Admitting: Physical Therapy

## 2021-01-14 DIAGNOSIS — Z9889 Other specified postprocedural states: Secondary | ICD-10-CM | POA: Diagnosis not present

## 2021-01-14 DIAGNOSIS — M6281 Muscle weakness (generalized): Secondary | ICD-10-CM

## 2021-01-14 DIAGNOSIS — M25562 Pain in left knee: Secondary | ICD-10-CM | POA: Diagnosis not present

## 2021-01-14 NOTE — Therapy (Addendum)
[facility] [place] [address], [place], [address] Phone: [phone]   Fax:  [phone]  Physical Therapy Treatment  Patient Details  Name: Kathryn Anderson MRN: [id number] Date of Birth: 2001/01/02 Referring Provider (PT): Rise Paganini   Encounter Date: 01/14/2021   PT End of Session - 01/14/21 1546     Visit Number 18    Number of Visits 24    Date for PT Re-Evaluation 01/20/21    Authorization Type [employer]    PT Start Time 1534    PT Stop Time 1615    PT Time Calculation (min) 41 min    Activity Tolerance Patient tolerated treatment well    Behavior During Therapy [facility] for tasks assessed/performed             Past Medical History:  Diagnosis Date   Headache     Past Surgical History:  Procedure Laterality Date   ANTERIOR CRUCIATE LIGAMENT REPAIR Left 10/05/2020   Procedure: LEFT KNEE ANTERIOR CRUCIATE LIGAMENT (ACL) RECONSTRUCTION WITH HAMSTRING AUTOGRAFT;  Surgeon: Cammy Copa, MD;  Location: [facility];  Service: Orthopedics;  Laterality: Left;   KNEE ARTHROSCOPY WITH MENISCAL REPAIR Left 10/05/2020   Procedure: MEDIAL MENISCAL REPAIR;  Surgeon: Cammy Copa, MD;  Location: [facility];  Service: Orthopedics;  Laterality: Left;   WISDOM TOOTH EXTRACTION      There were no vitals filed for this visit.   Subjective Assessment - 01/14/21 1538     Subjective Pt reports that things are going well.   "I like the forward and side lunges, and the single leg squats".  She is getting 10k steps/ day.  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  She reports her Lt knee has been "tired, sore, and stiff" at the end of the day - up to 4/10 pain. She ices her knee and this helps relieve some of pain.  Biggest complaint is stiffness.    Pertinent History history of dislocated knee cap x 2 on Lt    Patient Stated Goals Play  volleyball    Currently in Pain? No/denies    Pain Score 0-No pain                [facility] PT Assessment - 01/14/21 0001       Assessment   Medical Diagnosis s/p ACL reconstruction    Referring Provider (PT) Rise Paganini    Onset Date/Surgical Date 10/05/20    Hand Dominance Right    Next MD Visit to be scheduled in Dec.      AROM   Left Knee Flexion 140   supine, heel slide               [facility] Adult PT Treatment/Exercise - 01/14/21 0001       Knee/Hip Exercises: Stretches   Passive Hamstring Stretch Left;2 reps;30 seconds    Quad Stretch Left;4 reps;10 seconds;30 seconds    Quad Stretch Limitations standing - limited range.  Prone with strap x 3 with pillow under knee.    Gastroc Stretch Left;2 reps;20 seconds      Knee/Hip Exercises: Aerobic   Elliptical L3: 2 min forward, 2 min backward, 2 min forward - no pain      Knee/Hip Exercises: Machines for Strengthening   Total Gym Leg Press 9 plates x 15 reps, 7 plates with LLE x 4, 6 plates x 6  Knee/Hip Exercises: Standing   Lunge Walking - Round Trips 40 ft, with high knee follow through    SLS Lt single leg sit to/ from stand low black mat table x 10    Other Standing Knee Exercises Heel / toe walking with weight ball in/out from chest x 12 ft R/L x 2;  sled push/pull with 50# x 20 ft x 2.  Fitter with blue/black band for Lt hip ext x 15. Standing Lt hamstring curls with 5# (with Rt foot on pad to allow full knee ext of LLE) x 15.    Other Standing Knee Exercises side to side lunge (with hip hinge) x 10 each side, lifting 10# KB. .      Knee/Hip Exercises: Prone   Other Prone Exercises modified push ups (from knees on pillow) x 10      Kinesiotix   Create Space sensitive skin rock tape applied over Lt medial knee to patellar tendon area and over medial incision to incision near tibial tuberosity) with 20% stretch.  .                          PT Long Term Goals - 12/09/20 1440       PT  LONG TERM GOAL #1   Title Pt will be independent with HEP    Status On-going    Target Date 01/20/21      PT LONG TERM GOAL #2   Title Pt will improve FOTO to 67 to demo improved functional mobility    Baseline 63 on 12/09/20    Status On-going    Target Date 01/20/21      PT LONG TERM GOAL #3   Title Pt will improve Lt LE strength to 4+/5 to return to volleyball with decreasd pain    Status Achieved      PT LONG TERM GOAL #4   Title Pt will perform gait without AD with good technique x 300' to be functional in the community    Status Achieved      PT LONG TERM GOAL #5   Title Pt will returnto volleyball with pain <= 2/10    Time 6    Period Weeks    Status New    Target Date 01/20/21                   Plan - 01/14/21 2043     Clinical Impression Statement Pt demonstrated improved Lt knee flexion ROM.  Improved tolerance for eliptical and single leg sit to/from stands from lower surface.  LLE strength gradually improving. Encouraged pt to schedule follow up visit with surgeon in coming weeks.  Making great progress towards remainin goals.    Rehab Potential Good    PT Frequency 2x / week    PT Treatment/Interventions Aquatic Therapy;Cryotherapy;Moist Heat;Iontophoresis 4mg /ml Dexamethasone;Electrical Stimulation;Gait training;Stair training;Neuromuscular re-education;Balance training;Therapeutic exercise;Therapeutic activities;Patient/family education;Taping;Dry needling;Manual techniques;Vasopneumatic Device    PT Next Visit Plan protocol, eccentric control, balance/proprioception.  FOTO. End of POC.     PT Home Exercise Plan [email or web address]    Consulted and Agree with Plan of Care Patient             Patient will benefit from skilled therapeutic intervention in order to improve the following deficits and impairments:  Pain, Decreased strength, Decreased activity tolerance, Decreased range of motion, Impaired sensation, Difficulty walking, Decreased balance, Abnormal  gait  Visit Diagnosis: S/P ACL reconstruction  Acute pain of left knee  Muscle weakness (generalized)     Problem List Patient Active Problem List   Diagnosis Date Noted   Left anterior cruciate ligament tear    Peripheral tear of lateral meniscus of left knee as current injury    Acute medial meniscus tear of left knee    Fever 04/18/2020   Headache 04/18/2020   Vancomycin adverse reaction, initial encounter 04/16/2020   Chronic migraine without aura without status migrainosus, not intractable 01/28/2020   Chronic pain of both knees 01/28/2020   Ankle ligament laxity, left 12/19/2019    Mayer Camel, PTA 01/14/21 9:00 PM   [facility] [place] [address] [place], [place], [address] Phone: [phone]   Fax:  [phone]  Name: Kathryn Anderson MRN: [id number] Date of Birth: October 22, 2000

## 2021-01-21 ENCOUNTER — Other Ambulatory Visit: Payer: Self-pay

## 2021-01-21 ENCOUNTER — Encounter: Payer: Self-pay | Admitting: Physical Therapy

## 2021-01-21 ENCOUNTER — Ambulatory Visit (INDEPENDENT_AMBULATORY_CARE_PROVIDER_SITE_OTHER): Payer: Commercial Managed Care - PPO | Admitting: Physical Therapy

## 2021-01-21 DIAGNOSIS — M25562 Pain in left knee: Secondary | ICD-10-CM | POA: Diagnosis not present

## 2021-01-21 DIAGNOSIS — Z9889 Other specified postprocedural states: Secondary | ICD-10-CM | POA: Diagnosis not present

## 2021-01-21 DIAGNOSIS — M6281 Muscle weakness (generalized): Secondary | ICD-10-CM

## 2021-01-21 NOTE — Therapy (Addendum)
[place], [place], [address] Phone: [phone]   Fax:  [phone]  Physical Therapy Treatment and Recertification  Patient Details  Name: Kathryn Anderson MRN: [id number] Date of Birth: [birth year] Referring Provider (PT): Marcene Duos   Encounter Date: 01/21/2021   PT End of Session - 01/21/21 1816     Visit Number 19    Number of Visits 24    Authorization Type [place]    PT Start Time 4332    PT Stop Time 1616    PT Time Calculation (min) 46 min    Behavior During Therapy [facility] for tasks assessed/performed             Past Medical History:  Diagnosis Date   Headache     Past Surgical History:  Procedure Laterality Date   ANTERIOR CRUCIATE LIGAMENT REPAIR Left 10/05/2020   Procedure: LEFT KNEE ANTERIOR CRUCIATE LIGAMENT (ACL) RECONSTRUCTION WITH HAMSTRING AUTOGRAFT;  Surgeon: Meredith Pel, MD;  Location: [place] [facility];  Service: Orthopedics;  Laterality: Left;   KNEE ARTHROSCOPY WITH MENISCAL REPAIR Left 10/05/2020   Procedure: MEDIAL MENISCAL REPAIR;  Surgeon: Meredith Pel, MD;  Location: [place];  Service: Orthopedics;  Laterality: Left;   WISDOM TOOTH EXTRACTION      There were no vitals filed for this visit.   Subjective Assessment - 01/21/21 1540     Subjective Pt reports she has had more clicking in Lt hip and Lt knee over the last week.   She reports Lt knee pain up to 3/10 by end of day.  She only ices knee if pain is up to 4-5/10.    Pertinent History history of dislocated knee cap x 2 on Lt    Patient Stated Goals Play volleyball    Currently in Pain? No/denies    Pain Score 0-No pain                [facility] PT Assessment - 01/21/21 0001       Assessment   Medical Diagnosis s/p ACL reconstruction    Referring Provider (PT) Marcene Duos    Onset Date/Surgical Date 10/05/20    Hand Dominance Right    Next MD Visit  12/19?/22      AROM   Right Knee Extension 0    Right Knee Flexion 147    Left Knee Extension 0    Left Knee Flexion 141   supine, heel slide               [facility] Adult PT Treatment/Exercise - 01/21/21 0001       Self-Care   Other Self-Care Comments  pt instructed in self release with ball to L ant hip in prone.      Knee/Hip Exercises: Stretches   Passive Hamstring Stretch Left;2 reps;10 seconds    Quad Stretch Left;2 reps;20 seconds   standing   Hip Flexor Stretch Left;2 reps;30 seconds   seated with LE off chair.   Gastroc Stretch Left;2 reps;20 seconds    Other Knee/Hip Stretches Lt adductor stretch in supine with strap      Knee/Hip Exercises: Aerobic   Elliptical L: 2 min forward, 2 min backward, 2 min forward - no pain      Knee/Hip Exercises: Standing   Heel Raises Limitations sustained heel raise with overhead setting of orange pball x 30 sec x 2 reps    SLS Lt single leg squats with RLE on  low table behind her. x 10.    Walking with Sports Cord orange XTS - backwards walking, and R/L side stepping x 5 each direction    Other Standing Knee Exercises Lt hamstring gliders with Rt foot on towel and bilat hands on sink x 10 (limited range to tolerance)    Other Standing Knee Exercises side to side lunge x 5 (initially with 10# touching floor) - stopped due to increase Lt hip pain.      Manual Therapy   Soft tissue mobilization IASTM to Lt quad to decrease fascial restrictions.      Kinesiotix   Create Space sensitive skin rock tape applied over Lt medial knee to patellar tendon area and over medial incision to incision near tibial tuberosity) with 20% stretch.  .                  PT Long Term Goals - 01/21/21 1809       PT LONG TERM GOAL #1   Title Pt will be independent with HEP    Status Partially Met      PT LONG TERM GOAL #2   Title Pt will improve FOTO to 67 to demo improved functional mobility    Baseline 63 on 12/09/20    Status On-going       PT LONG TERM GOAL #3   Title Pt will improve Lt LE strength to 4+/5 to return to volleyball with decreasd pain    Status Achieved      PT LONG TERM GOAL #4   Title Pt will perform gait without AD with good technique x 300' to be functional in the community    Status Achieved      PT LONG TERM GOAL #5   Title Pt will returnto volleyball with pain <= 2/10    Baseline Has not attempted; not allowed to jump yet (01/21/21)    Time 6    Period Weeks    Status On-going                   Plan - 01/21/21 1810     Clinical Impression Statement Pt arrives with new complaint of Lt hip clicking/popping; only occured during Rt side lunges when returning to neutral.  Pt point tender in Lt ant hip; encouraged pt to complete hip flexor stretch and MFR ball release to area.  She tolerated other resistance exercises well for LLE without pain or symptoms.  IASTM to Lt quad reduced some of the clicking in Lt knee.  Pt has partially met her goals and will benefit from conitnued PT intervention to improve mobility and return to sport.  Pt is scheduled to return to MD mid-end of December.    Rehab Potential Good    PT Frequency 2x / week    PT Treatment/Interventions Aquatic Therapy;Cryotherapy;Moist Heat;Iontophoresis 31m/ml Dexamethasone;Electrical Stimulation;Gait training;Stair training;Neuromuscular re-education;Balance training;Therapeutic exercise;Therapeutic activities;Patient/family education;Taping;Dry needling;Manual techniques;Vasopneumatic Device    PT Next Visit Plan spoke to supervising PT; will request additional visits to lead up to next MD appt.  Assess hip if still bothering her.    PT Home Exercise Plan [email or web address]    Consulted and Agree with Plan of Care Patient             Patient will benefit from skilled therapeutic intervention in order to improve the following deficits and impairments:  Pain, Decreased strength, Decreased activity tolerance, Decreased range of motion,  Impaired sensation, Difficulty walking, Decreased balance, Abnormal gait  Visit Diagnosis:  S/P ACL reconstruction  Acute pain of left knee  Muscle weakness (generalized)     Problem List Patient Active Problem List   Diagnosis Date Noted   Left anterior cruciate ligament tear    Peripheral tear of lateral meniscus of left knee as current injury    Acute medial meniscus tear of left knee    Fever 04/18/2020   Headache 04/18/2020   Vancomycin adverse reaction, initial encounter 04/16/2020   Chronic migraine without aura without status migrainosus, not intractable 01/28/2020   Chronic pain of both knees 01/28/2020   Ankle ligament laxity, left 12/19/2019  Isabelle Course, PT,DPT11/18/229:20 AM  Kerin Perna, PTA 01/21/21 6:17 PM  [place], [place], [address] Phone: [phone]   Fax:  [phone]  Name: Kathryn Anderson MRN: [id number] Date of Birth: Aug 05, 2000

## 2021-01-22 NOTE — Addendum Note (Signed)
Addended by: Leone Brand on: 01/22/2021 09:21 AM   Modules accepted: Orders

## 2021-01-26 ENCOUNTER — Encounter: Payer: Commercial Managed Care - PPO | Admitting: Physical Therapy

## 2021-02-03 ENCOUNTER — Other Ambulatory Visit: Payer: Self-pay

## 2021-02-03 ENCOUNTER — Ambulatory Visit (INDEPENDENT_AMBULATORY_CARE_PROVIDER_SITE_OTHER): Payer: Commercial Managed Care - PPO | Admitting: Physical Therapy

## 2021-02-03 DIAGNOSIS — R600 Localized edema: Secondary | ICD-10-CM

## 2021-02-03 DIAGNOSIS — M25562 Pain in left knee: Secondary | ICD-10-CM | POA: Diagnosis not present

## 2021-02-03 DIAGNOSIS — Z9889 Other specified postprocedural states: Secondary | ICD-10-CM

## 2021-02-03 DIAGNOSIS — M6281 Muscle weakness (generalized): Secondary | ICD-10-CM | POA: Diagnosis not present

## 2021-02-03 NOTE — Therapy (Signed)
[facility] [place] [address] [place], [place], [address] Phone: [phone]   Fax:  [phone]  Physical Therapy Treatment and Recertification  Patient Details  Name: Kathryn Anderson MRN: [id number] Date of Birth: [birth year] Referring Provider (PT): Rise Paganini   Encounter Date: 02/03/2021   PT End of Session - 02/03/21 1557     Visit Number 20    Number of Visits 24    Date for PT Re-Evaluation 03/03/21    PT Start Time 1515    PT Stop Time 1558    PT Time Calculation (min) 43 min    Activity Tolerance Patient tolerated treatment well    Behavior During Therapy [facility] for tasks assessed/performed             Past Medical History:  Diagnosis Date   Headache     Past Surgical History:  Procedure Laterality Date   ANTERIOR CRUCIATE LIGAMENT REPAIR Left 10/05/2020   Procedure: LEFT KNEE ANTERIOR CRUCIATE LIGAMENT (ACL) RECONSTRUCTION WITH HAMSTRING AUTOGRAFT;  Surgeon: Cammy Copa, MD;  Location: [facility];  Service: Orthopedics;  Laterality: Left;   KNEE ARTHROSCOPY WITH MENISCAL REPAIR Left 10/05/2020   Procedure: MEDIAL MENISCAL REPAIR;  Surgeon: Cammy Copa, MD;  Location: [facility];  Service: Orthopedics;  Laterality: Left;   WISDOM TOOTH EXTRACTION      There were no vitals filed for this visit.   Subjective Assessment - 02/03/21 1516     Subjective pt reports that her Lt hip is still clicking occasionally when she walks. Insidous onset and stopping of clicking    Patient Stated Goals Play volleyball    Currently in Pain? Yes    Pain Score 2     Pain Location Knee    Pain Orientation Left    Pain Descriptors / Indicators Sore                [facility] PT Assessment - 02/03/21 0001       Assessment   Medical Diagnosis s/p ACL reconstruction    Referring Provider (PT) Rise Paganini    Onset Date/Surgical Date 10/05/20    Hand Dominance Right    Next  MD Visit 12/19?/22      Strength   Left Hip External Rotation 4/5    Left Hip ABduction 4/5                           [facility] Adult PT Treatment/Exercise - 02/03/21 0001       Knee/Hip Exercises: Stretches   Gastroc Stretch Left;2 reps;20 seconds    Other Knee/Hip Stretches Lt hip flexor stretch thomas stretch and seated both x 30 sec      Knee/Hip Exercises: Aerobic   Elliptical L2: 2 min forward, 2 min backward, 2 min forward - no pain      Knee/Hip Exercises: Standing   Heel Raises Limitations sustained heel raise with overhead setting of orange pball x 30 sec x 2 reps    SLS single leg squat off of 8'' step x 10    Other Standing Knee Exercises squat on BOSU with 10# KB x 20, Lt hamstring gliders with towel 2 x 10    Other Standing Knee Exercises side step green TB around feet x 2 laps of gray tile      Knee/Hip Exercises: Supine   Other Supine Knee/Hip Exercises bridge with green physioball roll in 2 x 10  Knee/Hip Exercises: Sidelying   Other Sidelying Knee/Hip Exercises side plank 15 sec x 3      Manual Therapy   Manual therapy comments educated pt on using rolling pin on hamstrings    Soft tissue mobilization STM Lt hamstring, ITB to reduce fascial restrictions                     PT Education - 02/03/21 1536     Education Details updated HEP    Person(s) Educated Patient    Methods Explanation;Handout;Demonstration    Comprehension Verbalized understanding;Returned demonstration                 PT Long Term Goals - 02/03/21 1600       PT LONG TERM GOAL #1   Title Pt will be independent with HEP    Status On-going    Target Date 03/03/21      PT LONG TERM GOAL #2   Title Pt will improve FOTO to 67 to demo improved functional mobility    Baseline 63 on 12/09/20    Status On-going    Target Date 03/03/21      PT LONG TERM GOAL #3   Title Pt will improve Lt LE strength to 4+/5 to return to volleyball with decreasd  pain    Status Achieved      PT LONG TERM GOAL #4   Title Pt will perform gait without AD with good technique x 300' to be functional in the community    Status Achieved      PT LONG TERM GOAL #5   Title Pt will returnto volleyball with pain <= 2/10    Status On-going    Target Date 03/03/21      PT LONG TERM GOAL #6   Title Pt will return to full work duties including kneeling and squatting with pain <=1/10    Status On-going    Target Date 03/03/21                   Plan - 02/03/21 1558     Clinical Impression Statement Pt continues with Lt hip clicking and popping. Noted weakness in Lt hip abdcution and ER. Pt fatigues quickly with hip strengthening exercises and with increased reps but with no increased pain. PT educated pt on importance of building mm endurance as well as strength, importance of continuing to ice her knee and use of rolling pin to reduce hamstring tightness. Pt will continue to benefit from skilled PT to return fully to work and sport    PT Frequency 2x / week    PT Duration 4 weeks    PT Treatment/Interventions Aquatic Therapy;Cryotherapy;Moist Heat;Iontophoresis 4mg /ml Dexamethasone;Electrical Stimulation;Gait training;Stair training;Neuromuscular re-education;Balance training;Therapeutic exercise;Therapeutic activities;Patient/family education;Taping;Dry needling;Manual techniques;Vasopneumatic Device    PT Next Visit Plan strength and endurance of knee and hip    PT Home Exercise Plan [email or web address]    Consulted and Agree with Plan of Care Patient             Patient will benefit from skilled therapeutic intervention in order to improve the following deficits and impairments:     Visit Diagnosis: S/P ACL reconstruction - Plan: PT plan of care cert/re-cert  Acute pain of left knee - Plan: PT plan of care cert/re-cert  Muscle weakness (generalized) - Plan: PT plan of care cert/re-cert  Localized edema - Plan: PT plan of care  cert/re-cert     Problem List Patient Active Problem List  Diagnosis Date Noted   Left anterior cruciate ligament tear    Peripheral tear of lateral meniscus of left knee as current injury    Acute medial meniscus tear of left knee    Fever 04/18/2020   Headache 04/18/2020   Vancomycin adverse reaction, initial encounter 04/16/2020   Chronic migraine without aura without status migrainosus, not intractable 01/28/2020   Chronic pain of both knees 01/28/2020   Ankle ligament laxity, left 12/19/2019    Janine Reller, PT 02/03/2021, 4:03 PM  [place] [address] [place], [place], [address] Phone: [phone]   Fax:  [phone]  Name: Kathryn Anderson MRN: [id number] Date of Birth: 2000-04-16

## 2021-02-03 NOTE — Patient Instructions (Signed)
Access Code: [email or web address] URL: [email or web address] Date: 02/03/2021 Prepared by: Reggy Eye  Exercises Straight Leg Raise with Arm Support - 1 x daily - 7 x weekly - 2-3 sets - 10 reps Sidelying Hip Adduction - 1 x daily - 7 x weekly - 2-3 sets - 10 reps Sidelying Hip Abduction - 1 x daily - 7 x weekly - 2-3 sets - 10 reps Prone Alternating Arm and Leg Lifts - 1 x daily - 7 x weekly - 2 sets - 10 reps Seated Table Hamstring Stretch - 2 x daily - 7 x weekly - 3 sets - 2-3 reps - 30 seconds hold Prone Quadriceps Stretch with Strap - 2 x daily - 7 x weekly - 1 sets - 2-3 reps - 30 seconds hold Wall Squat - 1 x daily - 7 x weekly - 1 sets - 10 reps - 10 seconds hold Forward T - 1 x daily - 3 x weekly - 2 sets - 10 reps Prone Hamstring Curl with Ankle Weight - 1 x daily - 3 x weekly - 2 sets - 10 reps Bridge with Heels on [employer] - 1 x daily - 3 x weekly - 2 sets - 10 reps Side Plank on Elbow - 1 x daily - 7 x weekly - 3 sets - 1 reps - 15 seconds hold Side Stepping with Resistance at Feet - 1 x daily - 7 x weekly - 3 sets - 10 reps Modified Thomas Stretch - 1 x daily - 7 x weekly - 3 sets - 1 reps - 20-30 seconds hold

## 2021-02-04 ENCOUNTER — Encounter: Payer: Commercial Managed Care - PPO | Admitting: Physical Therapy

## 2021-02-10 ENCOUNTER — Other Ambulatory Visit: Payer: Self-pay

## 2021-02-10 ENCOUNTER — Ambulatory Visit (INDEPENDENT_AMBULATORY_CARE_PROVIDER_SITE_OTHER): Payer: Commercial Managed Care - PPO | Admitting: Physical Therapy

## 2021-02-10 DIAGNOSIS — Z9889 Other specified postprocedural states: Secondary | ICD-10-CM | POA: Diagnosis not present

## 2021-02-10 DIAGNOSIS — M25562 Pain in left knee: Secondary | ICD-10-CM | POA: Diagnosis not present

## 2021-02-10 DIAGNOSIS — M6281 Muscle weakness (generalized): Secondary | ICD-10-CM | POA: Diagnosis not present

## 2021-02-10 DIAGNOSIS — R600 Localized edema: Secondary | ICD-10-CM | POA: Diagnosis not present

## 2021-02-10 NOTE — Therapy (Addendum)
[facility]  [address] [place], [place], [address] Phone: [phone]   Fax:  [phone]  Physical Therapy Treatment and Discharge  Patient Details  Name: Kathryn Anderson MRN: [id number] Date of Birth: [birth year] Referring Provider (PT): Marcene Duos   Encounter Date: 02/10/2021   PT End of Session - 02/10/21 1536     Visit Number 21    Number of Visits 24    Date for PT Re-Evaluation 03/03/21    Authorization Type UHC & [employer]    PT Start Time 1521    PT Stop Time 1604    PT Time Calculation (min) 43 min    Activity Tolerance Patient tolerated treatment well    Behavior During Therapy [facility] for tasks assessed/performed             Past Medical History:  Diagnosis Date   Headache     Past Surgical History:  Procedure Laterality Date   ANTERIOR CRUCIATE LIGAMENT REPAIR Left 10/05/2020   Procedure: LEFT KNEE ANTERIOR CRUCIATE LIGAMENT (ACL) RECONSTRUCTION WITH HAMSTRING AUTOGRAFT;  Surgeon: Meredith Pel, MD;  Location: [place];  Service: Orthopedics;  Laterality: Left;   KNEE ARTHROSCOPY WITH MENISCAL REPAIR Left 10/05/2020   Procedure: MEDIAL MENISCAL REPAIR;  Surgeon: Meredith Pel, MD;  Location: [place];  Service: Orthopedics;  Laterality: Left;   WISDOM TOOTH EXTRACTION      There were no vitals filed for this visit.   Subjective Assessment - 02/10/21 1528     Subjective Pt reports she is going to gym 3-4 days/ wk, so she is sore from this. Icing knee every other night.  She reports she has less popping Lt hip, with less pain.  She states the hip flexor stretch has helped this.  She has been able to squat and kneel (with LLE in front, less than 90) without any pain or difficulty.    Pertinent History history of dislocated knee cap x 2 on Lt    Currently in Pain? Yes    Pain Score 4     Pain Location Generalized                [facility] PT Assessment  - 02/10/21 0001       Assessment   Medical Diagnosis s/p ACL reconstruction    Referring Provider (PT) Marcene Duos    Onset Date/Surgical Date 10/05/20    Hand Dominance Right    Next MD Visit 02/22/21      Observation/Other Assessments   Focus on Therapeutic Outcomes (FOTO)  64 functional score                [facility] Adult PT Treatment/Exercise - 02/10/21 0001       Knee/Hip Exercises: Stretches   Active Hamstring Stretch Left;2 reps;20 seconds    Quad Stretch Left;4 reps;20 seconds   standing   Hip Flexor Stretch Left;2 reps;20 seconds    Gastroc Stretch Left;2 reps;20 seconds      Knee/Hip Exercises: Aerobic   Stationary Bike L3: 5 min    Tread Mill 2.29mh x 4 min      Knee/Hip Exercises: Standing   Lateral Step Up Limitations Rt heel taps with LLE on 6" step x 8 with red band on lateral part of knee (to assist with avoiding valgus)    SLS bulgarian split squats with back foot on low black mat holding 4# in each hand (with bicep curls) x 10 RLE, 8 LL.  Other Standing Knee Exercises sustainsed squats on upside down bosu with red ball toss to mini tramp;    Other Standing Knee Exercises gliders x 15 on LLE, 5 on RLE.      Knee/Hip Exercises: Sidelying   Clams x 20 each side with green band at knees     Knee/Hip Exercises: Prone   Other Prone Exercises plank with knee taps x 1 min . Trialed high plank with hip ext but increased pain in Lt knee joint line with WB exclusively with LLE (stopped)                          PT Long Term Goals - 02/03/21 1600       PT LONG TERM GOAL #1   Title Pt will be independent with HEP    Status On-going    Target Date 03/03/21      PT LONG TERM GOAL #2   Title Pt will improve FOTO to 67 to demo improved functional mobility    Baseline 63 on 12/09/20    Status On-going    Target Date 03/03/21      PT LONG TERM GOAL #3   Title Pt will improve Lt LE strength to 4+/5 to return to volleyball with decreasd pain     Status Achieved      PT LONG TERM GOAL #4   Title Pt will perform gait without AD with good technique x 300' to be functional in the community    Status Achieved      PT LONG TERM GOAL #5   Title Pt will returnto volleyball with pain <= 2/10    Status On-going    Target Date 03/03/21      PT LONG TERM GOAL #6   Title Pt will return to full work duties including kneeling and squatting with pain <=1/10    Status On-going    Target Date 03/03/21                   Plan - 02/10/21 1712     Clinical Impression Statement Decreased occurance of Lt hip popping since pt began stretching hip flexor.  She reported some discomfort around Lt patellar tendon with some of the squats and with terminal knee ext during plank.  Otherwise all other exercises tolerated well.  Making good progress towards remaining goals.    PT Frequency 2x / week    PT Duration 4 weeks    PT Treatment/Interventions Aquatic Therapy;Cryotherapy;Moist Heat;Iontophoresis 71m/ml Dexamethasone;Electrical Stimulation;Gait training;Stair training;Neuromuscular re-education;Balance training;Therapeutic exercise;Therapeutic activities;Patient/family education;Taping;Dry needling;Manual techniques;Vasopneumatic Device    PT Next Visit Plan strength and endurance of knee and hip    PT Home Exercise Plan [email or web address]    Consulted and Agree with Plan of Care Patient             Patient will benefit from skilled therapeutic intervention in order to improve the following deficits and impairments:  Pain, Decreased strength, Decreased activity tolerance, Decreased range of motion, Impaired sensation, Difficulty walking, Decreased balance, Abnormal gait  Visit Diagnosis: S/P ACL reconstruction  Acute pain of left knee  Muscle weakness (generalized)  Localized edema     Problem List Patient Active Problem List   Diagnosis Date Noted   Left anterior cruciate ligament tear    Peripheral tear of lateral meniscus of  left knee as current injury    Acute medial meniscus tear of left knee    Fever 04/18/2020  Headache 04/18/2020   Vancomycin adverse reaction, initial encounter 04/16/2020   Chronic migraine without aura without status migrainosus, not intractable 01/28/2020   Chronic pain of both knees 01/28/2020   Ankle ligament laxity, left 12/19/2019  PHYSICAL THERAPY DISCHARGE SUMMARY  Visits from Start of Care: 21  Current functional level related to goals / functional outcomes: Improved strength, gait, mobility and ROM   Remaining deficits: Return to sport   Education / Equipment: HEP   Patient agrees to discharge. Patient goals were partially met. Patient is being discharged due to being pleased with the current functional level. Isabelle Course, PT,DPT01/12/2308:40 AM   Kerin Perna, PTA 02/10/21 5:21 PM  [address] [place], [place], [address] Phone: [phone]   Fax:  [phone]  Name: Kathryn Anderson MRN: [id number] Date of Birth: [birth year]

## 2021-02-17 ENCOUNTER — Encounter: Payer: Commercial Managed Care - PPO | Admitting: Physical Therapy

## 2021-02-22 ENCOUNTER — Ambulatory Visit: Payer: Commercial Managed Care - PPO | Admitting: Orthopedic Surgery

## 2021-02-24 ENCOUNTER — Encounter: Payer: Commercial Managed Care - PPO | Admitting: Physical Therapy

## 2021-02-24 ENCOUNTER — Other Ambulatory Visit: Payer: Self-pay

## 2021-02-24 ENCOUNTER — Ambulatory Visit (INDEPENDENT_AMBULATORY_CARE_PROVIDER_SITE_OTHER): Payer: Commercial Managed Care - PPO | Admitting: Orthopedic Surgery

## 2021-02-24 DIAGNOSIS — Z9889 Other specified postprocedural states: Secondary | ICD-10-CM

## 2021-03-01 ENCOUNTER — Encounter: Payer: Self-pay | Admitting: Orthopedic Surgery

## 2021-03-01 NOTE — Progress Notes (Signed)
° °  Post-Op Visit Note   Patient: Kathryn Anderson           Date of Birth: 15-Jan-2001           MRN: [id number] Visit Date: 02/24/2021 PCP: Lavada Mesi, MD   Assessment & Plan:  Chief Complaint:  Chief Complaint  Patient presents with   Left Knee - Follow-up   Visit Diagnoses:  1. S/P ACL reconstruction     Plan: Darika is now about 4 and half months out left knee ACL reconstruction with hamstring autograft and medial meniscal repair.  She has been going to the gym and is back in the classroom.  Doing physical therapy 1 time a week for strength training.  Wants to get back to volleyball and other cutting and pivoting activities.  On examination she has excellent range of motion and excellent quad and hamstring strength bilaterally.  No effusion in the left knee.  Graft is stable to anterior drawer and Lachman testing.  Minimal medial joint line tenderness is present.  Plan is to continue to focus really on strengthening of the quad and hamstring muscles.  Would not really do sport specific cutting and pivoting exercises until 2 months from now which would be 6 months postop.  That is doing large part to the medial meniscal tear which was repaired.  Follow-Up Instructions: No follow-ups on file.   Orders:  No orders of the defined types were placed in this encounter.  No orders of the defined types were placed in this encounter.   Imaging: No results found.  PMFS History: Patient Active Problem List   Diagnosis Date Noted   Left anterior cruciate ligament tear    Peripheral tear of lateral meniscus of left knee as current injury    Acute medial meniscus tear of left knee    Fever 04/18/2020   Headache 04/18/2020   Vancomycin adverse reaction, initial encounter 04/16/2020   Chronic migraine without aura without status migrainosus, not intractable 01/28/2020   Chronic pain of both knees 01/28/2020   Ankle ligament laxity, left 12/19/2019   Past Medical History:   Diagnosis Date   Headache     Family History  Problem Relation Age of Onset   Healthy Mother    Healthy Father    Macular degeneration Maternal Grandmother    Glaucoma Maternal Grandmother    Dementia Paternal Grandfather    Heart disease Neg Hx     Past Surgical History:  Procedure Laterality Date   ANTERIOR CRUCIATE LIGAMENT REPAIR Left 10/05/2020   Procedure: LEFT KNEE ANTERIOR CRUCIATE LIGAMENT (ACL) RECONSTRUCTION WITH HAMSTRING AUTOGRAFT;  Surgeon: Cammy Copa, MD;  Location: [facility];  Service: Orthopedics;  Laterality: Left;   KNEE ARTHROSCOPY WITH MENISCAL REPAIR Left 10/05/2020   Procedure: MEDIAL MENISCAL REPAIR;  Surgeon: Cammy Copa, MD;  Location: [facility];  Service: Orthopedics;  Laterality: Left;   WISDOM TOOTH EXTRACTION     Social History   Occupational History   Not on file  Tobacco Use   Smoking status: Never   Smokeless tobacco: Never  Vaping Use   Vaping Use: Never used  Substance and Sexual Activity   Alcohol use: Never   Drug use: Never   Sexual activity: Not on file

## 2021-04-28 ENCOUNTER — Ambulatory Visit (INDEPENDENT_AMBULATORY_CARE_PROVIDER_SITE_OTHER): Payer: Commercial Managed Care - PPO | Admitting: Orthopedic Surgery

## 2021-04-28 ENCOUNTER — Other Ambulatory Visit: Payer: Self-pay

## 2021-04-28 DIAGNOSIS — Z9889 Other specified postprocedural states: Secondary | ICD-10-CM

## 2021-05-01 ENCOUNTER — Encounter: Payer: Self-pay | Admitting: Orthopedic Surgery

## 2021-05-01 NOTE — Progress Notes (Signed)
Office Visit Note   Patient: Kathryn Anderson           Date of Birth: [birth year]           MRN: [id number] Visit Date: 04/28/2021 Requested by: Lavada Mesi, MD [address] [place],  [place] [address] PCP: Lavada Mesi, MD  Subjective: Chief Complaint  Patient presents with   Left Knee - Follow-up    HPI: Saga is a 22 year old patient underwent ACL reconstruction medial meniscal repair on the left-hand side 10/05/2020.  She is starting a new job at [employer].  Stairs are okay.  She has been doing some stretching exercises.  Would like to potentially play pickle ball.  Not really ready for that yet.              ROS: All systems reviewed are negative as they relate to the chief complaint within the history of present illness.  Patient denies  fevers or chills.   Assessment & Plan: Visit Diagnoses:  1. S/P ACL reconstruction     Plan: Impression is left knee looks good following ACL reconstruction and medial meniscal repair.  The meniscus tear was a large tear.  Still having a little bit of mild medial joint line tenderness with flexion past 90 on that medial side.  No effusion.  Graft feels stable.  Hamstring strength is 5- out of 5 on the left 5+ out of 5 on the right.  Continue with agility training in [place] and 75-month return for final check.  Follow-Up Instructions: Return in about 3 months (around 07/26/2021).   Orders:  No orders of the defined types were placed in this encounter.  No orders of the defined types were placed in this encounter.     Procedures: No procedures performed   Clinical Data: No additional findings.  Objective: Vital Signs: There were no vitals taken for this visit.  Physical Exam:   Constitutional: Patient appears well-developed HEENT:  Head: Normocephalic Eyes:EOM are normal Neck: Normal range of motion Cardiovascular: Normal rate Pulmonary/chest: Effort normal Neurologic: Patient is alert Skin: Skin  is warm Psychiatric: Patient has normal mood and affect   Ortho Exam: Ortho exam demonstrates excellent range of motion with about 2 mm Lachman on the left compared to the right.  Very good endpoint.  No posterior lateral rotatory instability.  Knee flexion strength 5- out of 5 on the left 5+ out of 5 on the right.  No calf tenderness negative Homans.  Specialty Comments:  No specialty comments available.  Imaging: No results found.   PMFS History: Patient Active Problem List   Diagnosis Date Noted   Left anterior cruciate ligament tear    Peripheral tear of lateral meniscus of left knee as current injury    Acute medial meniscus tear of left knee    Fever 04/18/2020   Headache 04/18/2020   Vancomycin adverse reaction, initial encounter 04/16/2020   Chronic migraine without aura without status migrainosus, not intractable 01/28/2020   Chronic pain of both knees 01/28/2020   Ankle ligament laxity, left 12/19/2019   Past Medical History:  Diagnosis Date   Headache     Family History  Problem Relation Age of Onset   Healthy Mother    Healthy Father    Macular degeneration Maternal Grandmother    Glaucoma Maternal Grandmother    Dementia Paternal Grandfather    Heart disease Neg Hx     Past Surgical History:  Procedure Laterality Date   ANTERIOR CRUCIATE  LIGAMENT REPAIR Left 10/05/2020   Procedure: LEFT KNEE ANTERIOR CRUCIATE LIGAMENT (ACL) RECONSTRUCTION WITH HAMSTRING AUTOGRAFT;  Surgeon: Cammy Copa, MD;  Location: [facility];  Service: Orthopedics;  Laterality: Left;   KNEE ARTHROSCOPY WITH MENISCAL REPAIR Left 10/05/2020   Procedure: MEDIAL MENISCAL REPAIR;  Surgeon: Cammy Copa, MD;  Location: [facility];  Service: Orthopedics;  Laterality: Left;   WISDOM TOOTH EXTRACTION     Social History   Occupational History   Not on file  Tobacco Use   Smoking status: Never   Smokeless tobacco: Never  Vaping Use   Vaping Use:  Never used  Substance and Sexual Activity   Alcohol use: Never   Drug use: Never   Sexual activity: Not on file

## 2021-05-20 ENCOUNTER — Ambulatory Visit
Admission: RE | Admit: 2021-05-20 | Discharge: 2021-05-20 | Disposition: A | Discharge status: Home / Self Care | Payer: Commercial Managed Care - PPO | Source: Ambulatory Visit | Attending: Family Medicine | Admitting: Family Medicine

## 2021-05-20 ENCOUNTER — Other Ambulatory Visit: Payer: Self-pay | Admitting: Family Medicine

## 2021-07-01 ENCOUNTER — Encounter: Payer: Self-pay | Admitting: Physical Therapy

## 2021-07-01 ENCOUNTER — Ambulatory Visit: Payer: Commercial Managed Care - PPO | Attending: Orthopedic Surgery | Admitting: Physical Therapy

## 2021-07-01 DIAGNOSIS — M6281 Muscle weakness (generalized): Secondary | ICD-10-CM | POA: Diagnosis not present

## 2021-07-01 DIAGNOSIS — R2689 Other abnormalities of gait and mobility: Secondary | ICD-10-CM | POA: Diagnosis not present

## 2021-07-01 DIAGNOSIS — M25562 Pain in left knee: Secondary | ICD-10-CM | POA: Diagnosis not present

## 2021-07-01 DIAGNOSIS — M25662 Stiffness of left knee, not elsewhere classified: Secondary | ICD-10-CM

## 2021-07-01 DIAGNOSIS — Z9889 Other specified postprocedural states: Secondary | ICD-10-CM | POA: Diagnosis not present

## 2021-07-01 NOTE — Patient Instructions (Signed)
Access Code: [email or web address] ?URL: [email or web address] ?Date: 07/01/2021 ?Prepared by: Raynelle Fanning ? ?Exercises ?- Supine Hamstring Stretch with Strap  - 2 x daily - 7 x weekly - 1 sets - 3 reps - 60 sec hold ?- Supine ITB Stretch with Strap  - 2 x daily - 7 x weekly - 1 sets - 3 reps - 60 sec hold ?- Standing Quadriceps Stretch  - 2 x daily - 7 x weekly - 1 sets - 3 reps - 30-60 sec hold ?- Seated Hip Flexor Stretch  - 2 x daily - 7 x weekly - 1 sets - 3 reps - 30-60 sec hold ?- Squat with Chair Touch  - 1 x daily - 7 x weekly - 3 sets - 10 reps ?- Standard Lunge  - 1 x daily - 3 x weekly - 3 sets - 10 reps ?- Standing Single Leg Heel Raise  - 1 x daily - 3 x weekly - 3 sets - 10 reps ?

## 2021-07-01 NOTE — Therapy (Signed)
[facility] ?[address] [place] [address] ?[place], [place], [address] ?Phone: [phone]   Fax:  [phone] ? ?Physical Therapy Evaluation ? ?Patient Details  ?Name: Kathryn Anderson ?MRN: [id number] ?Date of Birth: November 04, 2000 ?Referring Provider (PT): Cammy Copa ? ? ?Encounter Date: 07/01/2021 ? ? PT End of Session - 07/01/21 0806   ? ? Visit Number 1   ? Number of Visits 8   ? Date for PT Re-Evaluation 08/26/21   ? Authorization Type UHC/UMR   ? PT Start Time [phone]   ? PT Stop Time 0853   ? PT Time Calculation (min) 43 min   ? Activity Tolerance Patient tolerated treatment well   ? Behavior During Therapy [facility] for tasks assessed/performed   ? ?  ?  ? ?  ? ? ?Past Medical History:  ?Diagnosis Date  ? Headache   ? ? ?Past Surgical History:  ?Procedure Laterality Date  ? ANTERIOR CRUCIATE LIGAMENT REPAIR Left 10/05/2020  ? Procedure: LEFT KNEE ANTERIOR CRUCIATE LIGAMENT (ACL) RECONSTRUCTION WITH HAMSTRING AUTOGRAFT;  Surgeon: Cammy Copa, MD;  Location: [facility];  Service: Orthopedics;  Laterality: Left;  ? KNEE ARTHROSCOPY WITH MENISCAL REPAIR Left 10/05/2020  ? Procedure: MEDIAL MENISCAL REPAIR;  Surgeon: Cammy Copa, MD;  Location: [facility];  Service: Orthopedics;  Laterality: Left;  ? WISDOM TOOTH EXTRACTION    ? ? ?There were no vitals filed for this visit. ? ? ? Subjective Assessment - 07/01/21 0811   ? ? Subjective Pt had ACL reconstruction 10/05/20 as well as bil menisci. She had PT in this clinic until December 2022. She now returns as she is still having intermittent pain and crunching with activity including stairs. She has a lot of stairs at work and by the second half of the day she is moving much slower.  She reports difficulty getting up/down off the floor and also feels she is still being very cautious with the knee. She also reports ROM deficits when trying to sit cross legged. Also gets increased pain and  stiffness with prolonged standing up to 6/10.   ? Pertinent History ACL reconstruction 10/05/20   ? How long can you stand comfortably? 20-30 min   ? Patient Stated Goals to get your knee back to normal   ? Currently in Pain? No/denies   ? ?  ?  ? ?  ? ? ? ? ? [facility] PT Assessment - 07/01/21 0001   ? ?  ? Assessment  ? Medical Diagnosis s/p left ACL reconstruction   ? Referring Provider (PT) Cammy Copa   ? Onset Date/Surgical Date 10/05/20   ? Hand Dominance Right   ? Next MD Visit July   ? Prior Therapy yes   ?  ? Precautions  ? Precautions None   ?  ? Restrictions  ? Weight Bearing Restrictions No   ?  ? Balance Screen  ? Has the patient fallen in the past 6 months No   ? Has the patient had a decrease in activity level because of a fear of falling?  No   ? Is the patient reluctant to leave their home because of a fear of falling?  No   ?  ? Home Environment  ? Living Environment Private residence   ? Additional Comments stairs at home   ?  ? Prior Function  ? Level of Independence Independent   ? Vocation Full time employment   ? Vocation Requirements  carrying on stairs; works at [employer]; can be prolonged standing as well   ? Leisure would like to get back to pickleball/volleyball and back to gym   ?  ? Functional Tests  ? Functional tests Squat;Lunges;Step up;Step down;Single Leg Squat;Single leg stance;Hopping;Jumping   ?  ? Squat  ? Comments shifts right uses Rt to push up   ?  ? Lunges  ? Comments WNL weaker on left   ?  ? Step Up  ? Comments WNL   ?  ? Step Down  ? Comments WNL for strength but painful   ?  ? Single Leg Squat  ? Comments left shaking and can't go as low as right   ?  ? Hopping  ? Comments SL left poor stability   ?  ? Jumping  ? Comments forward bil WNl; not tested from elevated surface   ?  ? Single Leg Stance  ? Comments 30 sec but shaky   ?  ? Posture/Postural Control  ? Posture Comments noticeable atrophy in left calf and quad   ?  ? ROM / Strength  ? AROM / PROM / Strength  AROM;Strength   ?  ? AROM  ? Overall AROM Comments left knee 0-142   ?  ? Strength  ? Overall Strength Comments Bil hips 5/5, however mild trendelenberg with gait; left HS 4-/5; functional weakness in left LE see above; able to complete 10 SL heel raises left   ?  ? Flexibility  ? Soft Tissue Assessment /Muscle Length yes   ? Hamstrings marked bil left>right   ? Quadriceps prone knee flex: Lt 112 deg; Rt 118 deg   ? ITB bil +ober Rt > Lt   ? Piriformis mild bil Rt>Lt   ?  ? Ambulation/Gait  ? Gait Comments mild left gluteus med weakness; stairs WNL but pain descending   ? ?  ?  ? ?  ? ? ? ? ? ? ? ? ? ? ? ? ? ?Objective measurements completed on examination: See above findings.  ? ? ? ? ? ? ? ? ? ? ? ? ? ? PT Education - 07/01/21 0854   ? ? Education Details HEP; POC   ? Person(s) Educated Patient   ? Methods Explanation;Demonstration;Handout   ? Comprehension Verbalized understanding;Returned demonstration   ? ?  ?  ? ?  ? ? ? PT Short Term Goals - 07/01/21 0915   ? ?  ? PT SHORT TERM GOAL #1  ? Title Patient will be independent with initial HEP   ? Time 2   ? Status New   ? Target Date 07/15/21   ?  ? PT SHORT TERM GOAL #2  ? Title -   ? ?  ?  ? ?  ? ? ? ? PT Long Term Goals - 07/01/21 0915   ? ?  ? PT LONG TERM GOAL #1  ? Title Pt will be independent with HEP and its progression   ? Time 8   ? Period Weeks   ? Status New   ? Target Date 08/26/21   ?  ? PT LONG TERM GOAL #2  ? Title Pt to demonstrate 5/5 left knee strength and solid functional strength in the left LE with functional tests including jumping and hopping.   ? Baseline -   ? Time 8   ? Period Weeks   ? Status New   ? Target Date 08/26/21   ?  ?  PT LONG TERM GOAL #3  ? Title Patient able to descend stairs and stand at work without pain.   ? Time 8   ? Period Weeks   ? Status New   ? Target Date 08/26/21   ?  ? PT LONG TERM GOAL #4  ? Title Pt able to tolerate return to running progression with a normal gait pattern and no pain.   ? Time 8   ? Period  Weeks   ? Status New   ? Target Date 08/26/21   ?  ? PT LONG TERM GOAL #5  ? Title Pt able to perform floor <> stand trnasfers without difficulty   ? Baseline -   ? Time 8   ? Period Weeks   ? Status New   ? Target Date 08/26/21   ? ?  ?  ? ?  ? ? ? ? ? ? ? ? ? Plan - 07/01/21 0855   ? ? Clinical Impression Statement Patient is a 21 yo female who returns to this clinic with ongoing c/o of left knee pain and stiffness. She will be 9 months s/p left ACL reconstruction on 07/05/21. She reports crunching and pain with prolonged standing, descending stairs and with floor <> stand transfers. She has full left knee ROM, but significant tightness in her HS and hips. She has a mild left trendelenberg with gait and weakness with functional activities including squats, lunges, single leg squats and hops and SLS. She will benefit from skilled PT to address these deficits and return her to painfree ADLs. Additionally patient would like to return to running.   ? Examination-Activity Limitations Transfers;Stairs;Stand   ? Stability/Clinical Decision Making Stable/Uncomplicated   ? Clinical Decision Making Low   ? Rehab Potential Excellent   ? PT Frequency 1x / week   ? PT Duration 8 weeks   ? PT Treatment/Interventions ADLs/Self Care Home Management;Cryotherapy;Electrical Stimulation;Iontophoresis 4mg /ml Dexamethasone;Moist Heat;Neuromuscular re-education;Balance training;Therapeutic exercise;Therapeutic activities;Functional mobility training;Stair training;Gait training;Patient/family education;Manual techniques;Dry needling;Taping   ? PT Next Visit Plan Work on functional quad and hamstring strengthening; glute med (see protocol); Also start return to running progression.   ? PT Home Exercise Plan [email or web address]   ? Consulted and Agree with Plan of Care Patient   ? ?  ?  ? ?  ? ? ?Patient will benefit from skilled therapeutic intervention in order to improve the following deficits and impairments:  Abnormal gait, Increased muscle  spasms, Pain, Impaired flexibility, Decreased strength, Decreased balance ? ?Visit Diagnosis: ?Acute pain of left knee ? ?Muscle weakness (generalized) ? ?Stiffness of left knee, not elsewhere classified ? ?Other a

## 2021-07-02 ENCOUNTER — Telehealth: Payer: Self-pay | Admitting: Orthopedic Surgery

## 2021-07-02 DIAGNOSIS — Z9889 Other specified postprocedural states: Secondary | ICD-10-CM

## 2021-07-02 NOTE — Telephone Encounter (Signed)
IC patient. Advised should not need to come in to get this. I entered referral patient OP PT in [place] should be able to access since they too are a [facility] facility.  ?

## 2021-07-02 NOTE — Telephone Encounter (Signed)
Patient need another copy of her physical therapy rx she lost the other one. Will come by and pick up  ?

## 2021-07-07 ENCOUNTER — Ambulatory Visit: Payer: Commercial Managed Care - PPO | Attending: Orthopedic Surgery | Admitting: Physical Therapy

## 2021-07-07 DIAGNOSIS — M25662 Stiffness of left knee, not elsewhere classified: Secondary | ICD-10-CM | POA: Diagnosis present

## 2021-07-07 DIAGNOSIS — M25562 Pain in left knee: Secondary | ICD-10-CM | POA: Insufficient documentation

## 2021-07-07 DIAGNOSIS — R2689 Other abnormalities of gait and mobility: Secondary | ICD-10-CM | POA: Insufficient documentation

## 2021-07-07 DIAGNOSIS — M6281 Muscle weakness (generalized): Secondary | ICD-10-CM | POA: Insufficient documentation

## 2021-07-07 DIAGNOSIS — Z9889 Other specified postprocedural states: Secondary | ICD-10-CM | POA: Diagnosis present

## 2021-07-07 NOTE — Therapy (Signed)
[place] [facility] ?[address] [place] [address] ?[place], [place], [address] ?Phone: [phone]   Fax:  [phone] ? ?Physical Therapy Treatment ? ?Patient Details  ?Name: Kathryn Anderson ?MRN: [id number] ?Date of Birth: Jan 06, 2001 ?Referring Provider (PT): Cammy Copa ? ? ?Encounter Date: 07/07/2021 ? ? PT End of Session - 07/07/21 0807   ? ? Visit Number 2   ? Number of Visits 8   ? Date for PT Re-Evaluation 08/26/21   ? Authorization Type UHC/UMR   ? PT Start Time 0805   ? PT Stop Time 0845   ? PT Time Calculation (min) 40 min   ? Activity Tolerance Patient tolerated treatment well   ? Behavior During Therapy [facility] for tasks assessed/performed   ? ?  ?  ? ?  ? ? ?Past Medical History:  ?Diagnosis Date  ? Headache   ? ? ?Past Surgical History:  ?Procedure Laterality Date  ? ANTERIOR CRUCIATE LIGAMENT REPAIR Left 10/05/2020  ? Procedure: LEFT KNEE ANTERIOR CRUCIATE LIGAMENT (ACL) RECONSTRUCTION WITH HAMSTRING AUTOGRAFT;  Surgeon: Cammy Copa, MD;  Location: [facility];  Service: Orthopedics;  Laterality: Left;  ? KNEE ARTHROSCOPY WITH MENISCAL REPAIR Left 10/05/2020  ? Procedure: MEDIAL MENISCAL REPAIR;  Surgeon: Cammy Copa, MD;  Location: [facility];  Service: Orthopedics;  Laterality: Left;  ? WISDOM TOOTH EXTRACTION    ? ? ?There were no vitals filed for this visit. ? ? Subjective Assessment - 07/07/21 0807   ? ? Subjective Pt states she's been doing the exercises. Pt reports she changed jobs and she's been busy with that.   ? Pertinent History ACL reconstruction 10/05/20   ? How long can you stand comfortably? 20-30 min   ? Patient Stated Goals to get your knee back to normal   ? ?  ?  ? ?  ? ? ? ? ? [facility] PT Assessment - 07/07/21 0001   ? ?  ? Assessment  ? Medical Diagnosis s/p left ACL reconstruction   ? Referring Provider (PT) Cammy Copa   ? Onset Date/Surgical Date 10/05/20   ? ?  ?  ? ?   ? ? ? ? ? ? ? ? ? ? ? ? ? ? ? ? [facility] Adult PT Treatment/Exercise - 07/07/21 0001   ? ?  ? Self-Care  ? Self-Care Scar Mobilizations;Other Self-Care Comments   ? Other Self-Care Comments  Discussed scar skin care and mobilizations   ?  ? Exercises  ? Exercises Knee/Hip   ?  ? Knee/Hip Exercises: Stretches  ? Passive Hamstring Stretch Right;Left;20 seconds   ? Passive Hamstring Stretch Limitations standing   ? Quad Stretch Right;Left;2 reps;20 seconds   ? Quad Stretch Limitations standing   ? Hip Flexor Stretch Right;Left;20 seconds   ?  ? Knee/Hip Exercises: Aerobic  ? Recumbent Bike L3 x 5 min   ?  ? Knee/Hip Exercises: Standing  ? Heel Raises Right;Left;10 reps;2 sets   ? Heel Raises Limitations knees straight and then knees bent   ? Lunge Walking - Round Trips Forwards and backwards 3x20'   ?  ? Knee/Hip Exercises: Seated  ? Sit to Sand 3 sets;10 reps;without UE support   staggered stance with L LE back  ? ?  ?  ? ?  ? ? ? ? ? ? ? ? ? ? ? ? PT Short Term Goals - 07/01/21 0915   ? ?  ? PT SHORT TERM GOAL #  1  ? Title Patient will be independent with initial HEP   ? Time 2   ? Status New   ? Target Date 07/15/21   ?  ? PT SHORT TERM GOAL #2  ? Title -   ? ?  ?  ? ?  ? ? ? ? PT Long Term Goals - 07/01/21 0915   ? ?  ? PT LONG TERM GOAL #1  ? Title Pt will be independent with HEP and its progression   ? Time 8   ? Period Weeks   ? Status New   ? Target Date 08/26/21   ?  ? PT LONG TERM GOAL #2  ? Title Pt to demonstrate 5/5 left knee strength and solid functional strength in the left LE with functional tests including jumping and hopping.   ? Baseline -   ? Time 8   ? Period Weeks   ? Status New   ? Target Date 08/26/21   ?  ? PT LONG TERM GOAL #3  ? Title Patient able to descend stairs and stand at work without pain.   ? Time 8   ? Period Weeks   ? Status New   ? Target Date 08/26/21   ?  ? PT LONG TERM GOAL #4  ? Title Pt able to tolerate return to running progression with a normal gait pattern and no pain.   ? Time  8   ? Period Weeks   ? Status New   ? Target Date 08/26/21   ?  ? PT LONG TERM GOAL #5  ? Title Pt able to perform floor <> stand trnasfers without difficulty   ? Baseline -   ? Time 8   ? Period Weeks   ? Status New   ? Target Date 08/26/21   ? ?  ?  ? ?  ? ? ? ? ? ? ? ? Plan - 07/07/21 0830   ? ? Clinical Impression Statement Treatment continued to focus on gross L LE strengthening. Went over lunging form (pt tends to bend knee forward over toes). Progressed HEP. Discussed skin care/scar massage and trial of dry needling for her scar.   ? Examination-Activity Limitations Transfers;Stairs;Stand   ? Stability/Clinical Decision Making Stable/Uncomplicated   ? Rehab Potential Excellent   ? PT Frequency 1x / week   ? PT Duration 8 weeks   ? PT Treatment/Interventions ADLs/Self Care Home Management;Cryotherapy;Electrical Stimulation;Iontophoresis 4mg /ml Dexamethasone;Moist Heat;Neuromuscular re-education;Balance training;Therapeutic exercise;Therapeutic activities;Functional mobility training;Stair training;Gait training;Patient/family education;Manual techniques;Dry needling;Taping   ? PT Next Visit Plan Work on functional quad and hamstring strengthening; glute med (see protocol); Also start return to running progression.   ? PT Home Exercise Plan [id number]   ? Consulted and Agree with Plan of Care Patient   ? ?  ?  ? ?  ? ? ?Patient will benefit from skilled therapeutic intervention in order to improve the following deficits and impairments:  Abnormal gait, Increased muscle spasms, Pain, Impaired flexibility, Decreased strength, Decreased balance ? ?Visit Diagnosis: ?Acute pain of left knee ? ?Muscle weakness (generalized) ? ?Stiffness of left knee, not elsewhere classified ? ?Other abnormalities of gait and mobility ? ?S/P ACL reconstruction ? ? ? ? ?Problem List ?Patient Active Problem List  ? Diagnosis Date Noted  ? Left anterior cruciate ligament tear   ? Peripheral tear of lateral meniscus of left knee as current  injury   ? Acute medial meniscus tear of left knee   ? Fever  04/18/2020  ? Headache 04/18/2020  ? Vancomycin adverse reaction, initial encounter 04/16/2020  ? Chronic migraine without aura without status migrainosus, not intractable 01/28/2020  ? Chronic pain of both knees 01/28/2020  ? Ankle ligament laxity, left 12/19/2019  ? ? ?Rylan Kaufmann April Dell PontoMa L Arlayne Liggins, PT, DPT ?07/07/2021, 8:44 AM ? ?[place] [facility] ?[address] ?[place], [place], [address] ?Phone: [phone]   Fax:  [phone] ? ?Name: Kathryn Anderson ?MRN: [id number] ?Date of Birth: 10/01/2000 ? ? ? ?

## 2021-07-14 ENCOUNTER — Ambulatory Visit: Payer: Commercial Managed Care - PPO | Admitting: Physical Therapy

## 2021-07-14 DIAGNOSIS — M25562 Pain in left knee: Secondary | ICD-10-CM | POA: Diagnosis not present

## 2021-07-14 DIAGNOSIS — M6281 Muscle weakness (generalized): Secondary | ICD-10-CM

## 2021-07-14 NOTE — Therapy (Signed)
[place] [facility] ?[address] [place] [address] ?[place], [place], [address] ?Phone: [phone]   Fax:  [phone] ? ?Physical Therapy Treatment ? ?Patient Details  ?Name: Kathryn Anderson ?MRN: [id number] ?Date of Birth: 2001-02-26 ?Referring Provider (PT): Cammy Copa ? ? ?Encounter Date: 07/14/2021 ? ? PT End of Session - 07/14/21 0752   ? ? Visit Number 3   ? Number of Visits 8   ? Date for PT Re-Evaluation 08/26/21   ? PT Start Time 0715   ? PT Stop Time 0753   ? PT Time Calculation (min) 38 min   ? Activity Tolerance Patient tolerated treatment well   ? Behavior During Therapy [facility] for tasks assessed/performed   ? ?  ?  ? ?  ? ? ?Past Medical History:  ?Diagnosis Date  ? Headache   ? ? ?Past Surgical History:  ?Procedure Laterality Date  ? ANTERIOR CRUCIATE LIGAMENT REPAIR Left 10/05/2020  ? Procedure: LEFT KNEE ANTERIOR CRUCIATE LIGAMENT (ACL) RECONSTRUCTION WITH HAMSTRING AUTOGRAFT;  Surgeon: Cammy Copa, MD;  Location: [facility];  Service: Orthopedics;  Laterality: Left;  ? KNEE ARTHROSCOPY WITH MENISCAL REPAIR Left 10/05/2020  ? Procedure: MEDIAL MENISCAL REPAIR;  Surgeon: Cammy Copa, MD;  Location: [facility];  Service: Orthopedics;  Laterality: Left;  ? WISDOM TOOTH EXTRACTION    ? ? ?There were no vitals filed for this visit. ? ? Subjective Assessment - 07/14/21 0718   ? ? Subjective Pt states she has "felt good all week". She continues to feel "achey" at the end of the day   ? Patient Stated Goals to get your knee back to normal   ? Currently in Pain? No/denies   ? ?  ?  ? ?  ? ? ? ? ? ? ? ? ? ? ? ? ? ? ? ? ? ? ? ? [facility] Adult PT Treatment/Exercise - 07/14/21 0001   ? ?  ? Knee/Hip Exercises: Stretches  ? Passive Hamstring Stretch Right;Left;20 seconds   ? Passive Hamstring Stretch Limitations standing   ? Quad Stretch Right;Left;2 reps;20 seconds   ? Quad Stretch Limitations standing   ? Hip Flexor Stretch  Right;Left;20 seconds   ?  ? Knee/Hip Exercises: Aerobic  ? Recumbent Bike L3 x 5 min   ?  ? Knee/Hip Exercises: Machines for Strengthening  ? Total Gym Leg Press DL:8 plates x 20, SL 5 plates x 20   ?  ? Knee/Hip Exercises: Plyometrics  ? Other Plyometric Exercises bilat hop in place x 30, fwd/backward hop x 30, lateral hop x 30   ?  ? Knee/Hip Exercises: Standing  ? Heel Raises Right;Left;10 reps;2 sets   ? Heel Raises Limitations knees straight   ? Side Lunges 15 reps   ? Forward Step Up 2 sets;Right;Left;10 reps   ? Forward Step Up Limitations with knee raise 3 kg med ball   ? Lunge Walking - Round Trips Forwards and backwards 3x20'   ?  ? Knee/Hip Exercises: Seated  ? Sit to Sand 15 reps   single leg  ?  ? Knee/Hip Exercises: Supine  ? Bridges Limitations bridge with LEs on physioball with roll in x 20   ? ?  ?  ? ?  ? ? ? ? ? ? ? ? ? ? ? ? PT Short Term Goals - 07/01/21 0915   ? ?  ? PT SHORT TERM GOAL #1  ? Title Patient will be independent  with initial HEP   ? Time 2   ? Status New   ? Target Date 07/15/21   ?  ? PT SHORT TERM GOAL #2  ? Title -   ? ?  ?  ? ?  ? ? ? ? PT Long Term Goals - 07/01/21 0915   ? ?  ? PT LONG TERM GOAL #1  ? Title Pt will be independent with HEP and its progression   ? Time 8   ? Period Weeks   ? Status New   ? Target Date 08/26/21   ?  ? PT LONG TERM GOAL #2  ? Title Pt to demonstrate 5/5 left knee strength and solid functional strength in the left LE with functional tests including jumping and hopping.   ? Baseline -   ? Time 8   ? Period Weeks   ? Status New   ? Target Date 08/26/21   ?  ? PT LONG TERM GOAL #3  ? Title Patient able to descend stairs and stand at work without pain.   ? Time 8   ? Period Weeks   ? Status New   ? Target Date 08/26/21   ?  ? PT LONG TERM GOAL #4  ? Title Pt able to tolerate return to running progression with a normal gait pattern and no pain.   ? Time 8   ? Period Weeks   ? Status New   ? Target Date 08/26/21   ?  ? PT LONG TERM GOAL #5  ? Title Pt  able to perform floor <> stand trnasfers without difficulty   ? Baseline -   ? Time 8   ? Period Weeks   ? Status New   ? Target Date 08/26/21   ? ?  ?  ? ?  ? ? ? ? ? ? ? ? Plan - 07/14/21 0753   ? ? Clinical Impression Statement Treatment focused on progressing strength and power. Added double leg hopping for return to sport. She will continue to benefit from continued plyometric training to return to sport as able   ? PT Next Visit Plan quad and HS strength, plyometrics, return to running   ? PT Home Exercise Plan [id number]   ? Consulted and Agree with Plan of Care Patient   ? ?  ?  ? ?  ? ? ?Patient will benefit from skilled therapeutic intervention in order to improve the following deficits and impairments:    ? ?Visit Diagnosis: ?Acute pain of left knee ? ?Muscle weakness (generalized) ? ? ? ? ?Problem List ?Patient Active Problem List  ? Diagnosis Date Noted  ? Left anterior cruciate ligament tear   ? Peripheral tear of lateral meniscus of left knee as current injury   ? Acute medial meniscus tear of left knee   ? Fever 04/18/2020  ? Headache 04/18/2020  ? Vancomycin adverse reaction, initial encounter 04/16/2020  ? Chronic migraine without aura without status migrainosus, not intractable 01/28/2020  ? Chronic pain of both knees 01/28/2020  ? Ankle ligament laxity, left 12/19/2019  ? ? ?Juliano Mceachin, PT ?07/14/2021, 7:54 AM ? ?[place] [facility] ?[address] [place] [address] ?[place], [place], [address] ?Phone: [phone]   Fax:  [phone] ? ?Name: Kathryn Anderson ?MRN: [id number] ?Date of Birth: 01-14-2001 ? ? ? ?

## 2021-07-21 ENCOUNTER — Ambulatory Visit: Payer: Commercial Managed Care - PPO | Admitting: Physical Therapy

## 2021-07-21 DIAGNOSIS — M25562 Pain in left knee: Secondary | ICD-10-CM

## 2021-07-21 DIAGNOSIS — M6281 Muscle weakness (generalized): Secondary | ICD-10-CM

## 2021-07-21 NOTE — Therapy (Signed)
[place] ?[facility] ?[address] ?[place], [place], [address] ?Phone: [phone]   Fax:  [phone] ? ?Physical Therapy Treatment ? ?Patient Details  ?Name: Kathryn Anderson ?MRN: [id number] ?Date of Birth: 2000-03-24 ?Referring Provider (PT): Meredith Pel ? ? ?Encounter Date: 07/21/2021 ? ? PT End of Session - 07/21/21 [id number]   ? ? Visit Number 4   ? Number of Visits 8   ? Date for PT Re-Evaluation 08/26/21   ? Authorization Type UHC/UMR   ? PT Start Time 0800   ? PT Stop Time 0841   ? PT Time Calculation (min) 41 min   ? Activity Tolerance Patient tolerated treatment well   ? Behavior During Therapy [facility] for tasks assessed/performed   ? ?  ?  ? ?  ? ? ?Past Medical History:  ?Diagnosis Date  ? Headache   ? ? ?Past Surgical History:  ?Procedure Laterality Date  ? ANTERIOR CRUCIATE LIGAMENT REPAIR Left 10/05/2020  ? Procedure: LEFT KNEE ANTERIOR CRUCIATE LIGAMENT (ACL) RECONSTRUCTION WITH HAMSTRING AUTOGRAFT;  Surgeon: Meredith Pel, MD;  Location: [place];  Service: Orthopedics;  Laterality: Left;  ? KNEE ARTHROSCOPY WITH MENISCAL REPAIR Left 10/05/2020  ? Procedure: MEDIAL MENISCAL REPAIR;  Surgeon: Meredith Pel, MD;  Location: [place];  Service: Orthopedics;  Laterality: Left;  ? WISDOM TOOTH EXTRACTION    ? ? ?There were no vitals filed for this visit. ? ? Subjective Assessment - 07/21/21 0805   ? ? Subjective Pt states she felt good after adding plyometrics last visit. She still feels "tight" by the end of the day   ? Currently in Pain? No/denies   ? ?  ?  ? ?  ? ? ? ? ? [facility] PT Assessment - 07/21/21 0001   ? ?  ? Assessment  ? Medical Diagnosis s/p left ACL reconstruction   ? Referring Provider (PT) Meredith Pel   ? Onset Date/Surgical Date 10/05/20   ?  ? Hopping  ? Comments improving stability on Lt LE   ? ?  ?  ? ?  ? ? ? ? ? ? ? ? ? ? ? ? ? ? ? ? [facility] Adult PT Treatment/Exercise - 07/21/21 0001   ? ?  ?  Knee/Hip Exercises: Stretches  ? Passive Hamstring Stretch Right;Left;20 seconds   ? Passive Hamstring Stretch Limitations standing   ? Quad Stretch Right;Left;2 reps;20 seconds   ? Quad Stretch Limitations standing   ? Hip Flexor Stretch Right;Left;20 seconds   ?  ? Knee/Hip Exercises: Aerobic  ? Recumbent Bike L4 x 5 min   ?  ? Knee/Hip Exercises: Machines for Strengthening  ? Total Gym Leg Press DL:8 plates x 10, 9 plates x 10, SL 5 plates x 20   ?  ? Knee/Hip Exercises: Plyometrics  ? Other Plyometric Exercises fwd/bkwd quick feet   ? Other Plyometric Exercises gait ladder double leg hop, single leg hop, double leg lateral hop, feet in/in/out/out   ?  ? Knee/Hip Exercises: Standing  ? Heel Raises Both;2 sets;10 reps   ? Heel Raises Limitations x 20 knees straight and then x 20 knees bent holding 8# bilat   ? Side Lunges 15 reps   ? Forward Step Up 3 sets;10 reps;Step Height: 8"   ? Forward Step Up Limitations with knee raise, 3kg med ball   ? Functional Squat Limitations squat standing on incline board 3 x 10 holding 10# KB   ?  SLS SLS reach to floor x 12   ? Other Standing Knee Exercises hip 3 way on pillowcase holding 10# KB x 10   ?  ? Knee/Hip Exercises: Supine  ? Bridges Limitations bridge with LE slides on pillow case x 10   ? ?  ?  ? ?  ? ? ? ? ? ? ? ? ? ? ? ? PT Short Term Goals - 07/01/21 0915   ? ?  ? PT SHORT TERM GOAL #1  ? Title Patient will be independent with initial HEP   ? Time 2   ? Status New   ? Target Date 07/15/21   ?  ? PT SHORT TERM GOAL #2  ? Title -   ? ?  ?  ? ?  ? ? ? ? PT Long Term Goals - 07/01/21 0915   ? ?  ? PT LONG TERM GOAL #1  ? Title Pt will be independent with HEP and its progression   ? Time 8   ? Period Weeks   ? Status New   ? Target Date 08/26/21   ?  ? PT LONG TERM GOAL #2  ? Title Pt to demonstrate 5/5 left knee strength and solid functional strength in the left LE with functional tests including jumping and hopping.   ? Baseline -   ? Time 8   ? Period Weeks   ?  Status New   ? Target Date 08/26/21   ?  ? PT LONG TERM GOAL #3  ? Title Patient able to descend stairs and stand at work without pain.   ? Time 8   ? Period Weeks   ? Status New   ? Target Date 08/26/21   ?  ? PT LONG TERM GOAL #4  ? Title Pt able to tolerate return to running progression with a normal gait pattern and no pain.   ? Time 8   ? Period Weeks   ? Status New   ? Target Date 08/26/21   ?  ? PT LONG TERM GOAL #5  ? Title Pt able to perform floor <> stand trnasfers without difficulty   ? Baseline -   ? Time 8   ? Period Weeks   ? Status New   ? Target Date 08/26/21   ? ?  ?  ? ?  ? ? ? ? ? ? ? ? Plan - 07/21/21 0842   ? ? Clinical Impression Statement Pt with good tolerance of single leg hopping this visit. Still with decreased hamstring strenght. Added bridges with LE slides and SLS with reach to floor to improve HS strength and stability   ? PT Next Visit Plan quad and HS strength, plyometrics, return to running   ? PT Home Exercise Plan [id number]   ? Consulted and Agree with Plan of Care Patient   ? ?  ?  ? ?  ? ? ?Patient will benefit from skilled therapeutic intervention in order to improve the following deficits and impairments:    ? ?Visit Diagnosis: ?Acute pain of left knee ? ?Muscle weakness (generalized) ? ? ? ? ?Problem List ?Patient Active Problem List  ? Diagnosis Date Noted  ? Left anterior cruciate ligament tear   ? Peripheral tear of lateral meniscus of left knee as current injury   ? Acute medial meniscus tear of left knee   ? Fever 04/18/2020  ? Headache 04/18/2020  ? Vancomycin adverse reaction, initial encounter 04/16/2020  ? Chronic  migraine without aura without status migrainosus, not intractable 01/28/2020  ? Chronic pain of both knees 01/28/2020  ? Ankle ligament laxity, left 12/19/2019  ? ? ?Blakeleigh Domek, PT ?07/21/2021, 8:43 AM ? ?[place] [facility] ?[address] ?[place], [place], [address] ?Phone: [phone]   Fax:   [phone] ? ?Name: Kathryn Anderson ?MRN: [id number] ?Date of Birth: 12-01-2000 ? ? ? ?

## 2021-07-26 ENCOUNTER — Ambulatory Visit: Payer: Commercial Managed Care - PPO | Admitting: Orthopedic Surgery

## 2021-07-28 ENCOUNTER — Ambulatory Visit: Payer: Commercial Managed Care - PPO | Admitting: Physical Therapy

## 2021-07-28 DIAGNOSIS — M25562 Pain in left knee: Secondary | ICD-10-CM | POA: Diagnosis not present

## 2021-07-28 DIAGNOSIS — M6281 Muscle weakness (generalized): Secondary | ICD-10-CM

## 2021-07-28 NOTE — Therapy (Signed)
[place], [place], [address] Phone: [phone]   Fax:  [phone]  Physical Therapy Treatment  Patient Details  Name: Celise Cliatt MRN: [id number] Date of Birth: 2000/11/22 Referring Provider (PT): Meredith Pel   Encounter Date: 07/28/2021 Rationale for Evaluation and Treatment Rehabilitation   PT End of Session - 07/28/21 0755     Visit Number 5    Number of Visits 8    Date for PT Re-Evaluation 08/26/21    PT Start Time 0720    PT Stop Time 0758    PT Time Calculation (min) 38 min    Activity Tolerance Patient tolerated treatment well    Behavior During Therapy [facility] for tasks assessed/performed             Past Medical History:  Diagnosis Date   Headache     Past Surgical History:  Procedure Laterality Date   ANTERIOR CRUCIATE LIGAMENT REPAIR Left 10/05/2020   Procedure: LEFT KNEE ANTERIOR CRUCIATE LIGAMENT (ACL) RECONSTRUCTION WITH HAMSTRING AUTOGRAFT;  Surgeon: Meredith Pel, MD;  Location: [place];  Service: Orthopedics;  Laterality: Left;   KNEE ARTHROSCOPY WITH MENISCAL REPAIR Left 10/05/2020   Procedure: MEDIAL MENISCAL REPAIR;  Surgeon: Meredith Pel, MD;  Location: [place];  Service: Orthopedics;  Laterality: Left;   WISDOM TOOTH EXTRACTION      There were no vitals filed for this visit.   Subjective Assessment - 07/28/21 0722     Subjective Pt states she was able to play pickleball without pain! She did wear her brace but states she felt she was able to move all directions    Patient Stated Goals to get your knee back to normal    Currently in Pain? No/denies                               [facility] Adult PT Treatment/Exercise - 07/28/21 0001       Knee/Hip Exercises: Stretches   Passive Hamstring Stretch Right;Left;20 seconds    Passive Hamstring Stretch Limitations standing    Quad Stretch  Right;Left;2 reps;20 seconds    Quad Stretch Limitations standing    Hip Flexor Stretch Right;Left;20 seconds      Knee/Hip Exercises: Aerobic   Recumbent Bike L4 x 5 min      Knee/Hip Exercises: Machines for Strengthening   Total Gym Leg Press DL: 9 plates x 10, 10 plates x 10, SL 6 plates x15, 10      Knee/Hip Exercises: Plyometrics   Other Plyometric Exercises speed skater jumps x 20 bilat green TB      Knee/Hip Exercises: Standing   Heel Raises Both    Heel Raises Limitations x 20 toes straight, in, out holding 8# bilat    Side Lunges 20 reps    Forward Step Up 3 sets;10 reps   12'' step   Forward Step Up Limitations with knee raise    SLS SLS reach to floor x 20 10#KB    Other Standing Knee Exercises hip 3 way on pillowcase holding 10# KB x 10      Knee/Hip Exercises: Supine   Heel Slides Limitations resisted green TB x 30                       PT Short Term Goals - 07/01/21 0915       PT SHORT  TERM GOAL #1   Title Patient will be independent with initial HEP    Time 2    Status New    Target Date 07/15/21      PT SHORT TERM GOAL #2   Title -               PT Long Term Goals - 07/01/21 0915       PT LONG TERM GOAL #1   Title Pt will be independent with HEP and its progression    Time 8    Period Weeks    Status New    Target Date 08/26/21      PT LONG TERM GOAL #2   Title Pt to demonstrate 5/5 left knee strength and solid functional strength in the left LE with functional tests including jumping and hopping.    Baseline -    Time 8    Period Weeks    Status New    Target Date 08/26/21      PT LONG TERM GOAL #3   Title Patient able to descend stairs and stand at work without pain.    Time 8    Period Weeks    Status New    Target Date 08/26/21      PT LONG TERM GOAL #4   Title Pt able to tolerate return to running progression with a normal gait pattern and no pain.    Time 8    Period Weeks    Status New    Target Date  08/26/21      PT LONG TERM GOAL #5   Title Pt able to perform floor <> stand trnasfers without difficulty    Baseline -    Time 8    Period Weeks    Status New    Target Date 08/26/21                   Plan - 07/28/21 0755     Clinical Impression Statement Added resisted hamstring curl and progressed step ups and plyo as tolerated. She continues to progress strength and mm endurance    PT Next Visit Plan quad and HS strength, plyometrics    PT Home Exercise Plan [facility]    Consulted and Agree with Plan of Care Patient             Patient will benefit from skilled therapeutic intervention in order to improve the following deficits and impairments:     Visit Diagnosis: Acute pain of left knee  Muscle weakness (generalized)     Problem List Patient Active Problem List   Diagnosis Date Noted   Left anterior cruciate ligament tear    Peripheral tear of lateral meniscus of left knee as current injury    Acute medial meniscus tear of left knee    Fever 04/18/2020   Headache 04/18/2020   Vancomycin adverse reaction, initial encounter 04/16/2020   Chronic migraine without aura without status migrainosus, not intractable 01/28/2020   Chronic pain of both knees 01/28/2020   Ankle ligament laxity, left 12/19/2019    Ruston Fedora, PT 07/28/2021, 7:57 AM  [facility] [place], [place], [address] Phone: [phone]   Fax:  [phone]  Name: Glynnis Qualls MRN: [id number] Date of Birth: Jun 04, 2000

## 2021-08-04 ENCOUNTER — Ambulatory Visit: Payer: Commercial Managed Care - PPO | Admitting: Physical Therapy

## 2021-08-04 DIAGNOSIS — Z9889 Other specified postprocedural states: Secondary | ICD-10-CM

## 2021-08-04 DIAGNOSIS — M25662 Stiffness of left knee, not elsewhere classified: Secondary | ICD-10-CM

## 2021-08-04 DIAGNOSIS — M25562 Pain in left knee: Secondary | ICD-10-CM | POA: Diagnosis not present

## 2021-08-04 DIAGNOSIS — M6281 Muscle weakness (generalized): Secondary | ICD-10-CM

## 2021-08-04 DIAGNOSIS — R2689 Other abnormalities of gait and mobility: Secondary | ICD-10-CM

## 2021-08-04 NOTE — Therapy (Signed)
[facility] [place] [address] [place], [place], [address] Phone: [phone]   Fax:  [phone]  Physical Therapy Treatment  Patient Details  Name: Kathryn Anderson MRN: [id number] Date of Birth: [birth year] Referring Provider (PT): Cammy Copa   Encounter Date: 08/04/2021   PT End of Session - 08/04/21 0719     Visit Number 6    Number of Visits 8    Date for PT Re-Evaluation 08/26/21    PT Start Time 0719    PT Stop Time 0800    PT Time Calculation (min) 41 min    Activity Tolerance Patient tolerated treatment well    Behavior During Therapy [facility] for tasks assessed/performed             Past Medical History:  Diagnosis Date   Headache     Past Surgical History:  Procedure Laterality Date   ANTERIOR CRUCIATE LIGAMENT REPAIR Left 10/05/2020   Procedure: LEFT KNEE ANTERIOR CRUCIATE LIGAMENT (ACL) RECONSTRUCTION WITH HAMSTRING AUTOGRAFT;  Surgeon: Cammy Copa, MD;  Location: [facility];  Service: Orthopedics;  Laterality: Left;   KNEE ARTHROSCOPY WITH MENISCAL REPAIR Left 10/05/2020   Procedure: MEDIAL MENISCAL REPAIR;  Surgeon: Cammy Copa, MD;  Location: [facility];  Service: Orthopedics;  Laterality: Left;   WISDOM TOOTH EXTRACTION      There were no vitals filed for this visit.   Subjective Assessment - 08/04/21 0720     Subjective Pt states that her knee has been really stiff and sore with extension. Pt notes it started feeling this way Monday afternoon. Pt reports she played a little bit of tennis and pickleball on Saturday.    Pertinent History ACL reconstruction 10/05/20    Patient Stated Goals to get your knee back to normal    Currently in Pain? No/denies                [facility] PT Assessment - 08/04/21 0001       Assessment   Medical Diagnosis s/p left ACL reconstruction    Referring Provider (PT) Cammy Copa    Onset Date/Surgical Date  10/05/20                           [facility] Adult PT Treatment/Exercise - 08/04/21 0001       Knee/Hip Exercises: Stretches   Passive Hamstring Stretch Right;Left;30 seconds    Passive Hamstring Stretch Limitations supine    Quad Stretch Right;Left;2 reps;30 seconds    Quad Stretch Limitations prone    Hip Flexor Stretch Left;2 reps;30 seconds    ITB Stretch Left;2 reps;30 seconds      Knee/Hip Exercises: Aerobic   Recumbent Bike L4 x 5 min      Knee/Hip Exercises: Standing   Heel Raises Left;3 sets;10 reps;Right    Heel Raises Limitations eccentric    SLS with Vectors Reach down to cones L, forward and R 2x10 each leg      Knee/Hip Exercises: Supine   Straight Leg Raises Strengthening;3 sets;10 reps;Left    Straight Leg Raises Limitations red tband    Other Supine Knee/Hip Exercises hamstring curl with pball into a bridge 3x10      Manual Therapy   Manual therapy comments IASTM on L quad, ice massage on L patellar tendon  PT Short Term Goals - 07/01/21 0915       PT SHORT TERM GOAL #1   Title Patient will be independent with initial HEP    Time 2    Status New    Target Date 07/15/21      PT SHORT TERM GOAL #2   Title -               PT Long Term Goals - 07/01/21 0915       PT LONG TERM GOAL #1   Title Pt will be independent with HEP and its progression    Time 8    Period Weeks    Status New    Target Date 08/26/21      PT LONG TERM GOAL #2   Title Pt to demonstrate 5/5 left knee strength and solid functional strength in the left LE with functional tests including jumping and hopping.    Baseline -    Time 8    Period Weeks    Status New    Target Date 08/26/21      PT LONG TERM GOAL #3   Title Patient able to descend stairs and stand at work without pain.    Time 8    Period Weeks    Status New    Target Date 08/26/21      PT LONG TERM GOAL #4   Title Pt able to tolerate return to running  progression with a normal gait pattern and no pain.    Time 8    Period Weeks    Status New    Target Date 08/26/21      PT LONG TERM GOAL #5   Title Pt able to perform floor <> stand trnasfers without difficulty    Baseline -    Time 8    Period Weeks    Status New    Target Date 08/26/21                   Plan - 08/04/21 0800     Clinical Impression Statement Pt demos s/s consistent with likely patellar tendonitis after playing pickleball and tennis x 1 hour. Discussed with pt rest, ice, anti inflammatories, stretches/massage to help with inflammation. Provided exercises that should not aggravate patellar tendon. Due to this exacerbation, did not attempt to progress her plyometrics.    Examination-Activity Limitations Transfers;Stairs;Stand    PT Treatment/Interventions ADLs/Self Care Home Management;Cryotherapy;Electrical Stimulation;Iontophoresis 4mg /ml Dexamethasone;Moist Heat;Neuromuscular re-education;Balance training;Therapeutic exercise;Therapeutic activities;Functional mobility training;Stair training;Gait training;Patient/family education;Manual techniques;Dry needling;Taping    PT Next Visit Plan quad and HS strength, plyometrics    PT Home Exercise Plan [facility]    Consulted and Agree with Plan of Care Patient             Patient will benefit from skilled therapeutic intervention in order to improve the following deficits and impairments:  Abnormal gait, Increased muscle spasms, Pain, Impaired flexibility, Decreased strength, Decreased balance  Visit Diagnosis: Acute pain of left knee  Muscle weakness (generalized)  Stiffness of left knee, not elsewhere classified  Other abnormalities of gait and mobility  S/P ACL reconstruction     Problem List Patient Active Problem List   Diagnosis Date Noted   Left anterior cruciate ligament tear    Peripheral tear of lateral meniscus of left knee as current injury    Acute medial meniscus tear of left  knee    Fever 04/18/2020   Headache 04/18/2020   Vancomycin adverse reaction, initial  encounter 04/16/2020   Chronic migraine without aura without status migrainosus, not intractable 01/28/2020   Chronic pain of both knees 01/28/2020   Ankle ligament laxity, left 12/19/2019    [facility] April Ma L Yaelis Scharfenberg, PT, DPT 08/04/2021, 8:02 AM  [facility] [address] [place], [place], [address] Phone: [phone]   Fax:  [phone]  Name: Kathryn Pintobigail Madlock MRN: [id number] Date of Birth: September 16, 2000

## 2021-08-11 ENCOUNTER — Ambulatory Visit: Payer: Commercial Managed Care - PPO | Admitting: Physical Therapy

## 2021-08-18 ENCOUNTER — Ambulatory Visit: Payer: Commercial Managed Care - PPO | Attending: Orthopedic Surgery | Admitting: Physical Therapy

## 2021-08-18 DIAGNOSIS — M6281 Muscle weakness (generalized): Secondary | ICD-10-CM | POA: Diagnosis present

## 2021-08-18 DIAGNOSIS — R2689 Other abnormalities of gait and mobility: Secondary | ICD-10-CM | POA: Insufficient documentation

## 2021-08-18 DIAGNOSIS — Z9889 Other specified postprocedural states: Secondary | ICD-10-CM | POA: Diagnosis present

## 2021-08-18 DIAGNOSIS — M25662 Stiffness of left knee, not elsewhere classified: Secondary | ICD-10-CM | POA: Insufficient documentation

## 2021-08-18 DIAGNOSIS — M25562 Pain in left knee: Secondary | ICD-10-CM | POA: Insufficient documentation

## 2021-08-18 NOTE — Therapy (Signed)
[facility] [place] [address] [place], [place], [address] Phone: [phone]   Fax:  [phone]  Physical Therapy Treatment  Patient Details  Name: Kathryn Anderson MRN: [id number] Date of Birth: 2000/05/15 Referring Provider (PT): Cammy Copa   Encounter Date: 08/18/2021 Rationale for Evaluation and Treatment Rehabilitation   PT End of Session - 08/18/21 0757     Visit Number 7    Number of Visits 8    Date for PT Re-Evaluation 08/26/21    Authorization Type UHC/UMR    PT Start Time 0715    PT Stop Time 0757    PT Time Calculation (min) 42 min    Activity Tolerance Patient tolerated treatment well    Behavior During Therapy [facility] for tasks assessed/performed             Past Medical History:  Diagnosis Date   Headache     Past Surgical History:  Procedure Laterality Date   ANTERIOR CRUCIATE LIGAMENT REPAIR Left 10/05/2020   Procedure: LEFT KNEE ANTERIOR CRUCIATE LIGAMENT (ACL) RECONSTRUCTION WITH HAMSTRING AUTOGRAFT;  Surgeon: Cammy Copa, MD;  Location: [facility];  Service: Orthopedics;  Laterality: Left;   KNEE ARTHROSCOPY WITH MENISCAL REPAIR Left 10/05/2020   Procedure: MEDIAL MENISCAL REPAIR;  Surgeon: Cammy Copa, MD;  Location: [facility];  Service: Orthopedics;  Laterality: Left;   WISDOM TOOTH EXTRACTION      There were no vitals filed for this visit.   Subjective Assessment - 08/18/21 0720     Subjective Pt states she backed off of exercises due to soreness and it helped. She has begun exercising again and still has soreness in anterior knee with squats and lunges    Patient Stated Goals to get your knee back to normal    Currently in Pain? No/denies                [facility] PT Assessment - 08/18/21 0001       Assessment   Medical Diagnosis s/p left ACL reconstruction    Referring Provider (PT) Cammy Copa    Onset Date/Surgical Date  10/05/20                           [facility] Adult PT Treatment/Exercise - 08/18/21 0001       Knee/Hip Exercises: Stretches   Passive Hamstring Stretch Right;Left;30 seconds    Passive Hamstring Stretch Limitations standing    Quad Stretch Right;Left;2 reps;30 seconds    Lobbyist Limitations standing    Hip Flexor Stretch Left;2 reps;30 seconds      Knee/Hip Exercises: Aerobic   Recumbent Bike L4 x 5 min      Knee/Hip Exercises: Machines for Strengthening   Total Gym Leg Press DL: 10 plates 2 x 10, SL 6 plates 2 x 10      Knee/Hip Exercises: Plyometrics   Bilateral Jumping Limitations jump off 8'' box land on DL x 10, land on SL x 10    Other Plyometric Exercises hop and land on blue bosu ball 2 x 10      Knee/Hip Exercises: Standing   Heel Raises Both;3 sets;10 reps    Side Lunges Limitations curtsey lunge 10# KB 2 x 10 bilat    Wall Squat 2 sets;10 reps    Other Standing Knee Exercises single leg STS 2 x 10    Other Standing Knee Exercises single leg dead lift 10#KB  2 x 10                       PT Short Term Goals - 07/01/21 0915       PT SHORT TERM GOAL #1   Title Patient will be independent with initial HEP    Time 2    Status New    Target Date 07/15/21      PT SHORT TERM GOAL #2   Title -               PT Long Term Goals - 07/01/21 0915       PT LONG TERM GOAL #1   Title Pt will be independent with HEP and its progression    Time 8    Period Weeks    Status New    Target Date 08/26/21      PT LONG TERM GOAL #2   Title Pt to demonstrate 5/5 left knee strength and solid functional strength in the left LE with functional tests including jumping and hopping.    Baseline -    Time 8    Period Weeks    Status New    Target Date 08/26/21      PT LONG TERM GOAL #3   Title Patient able to descend stairs and stand at work without pain.    Time 8    Period Weeks    Status New    Target Date 08/26/21      PT LONG  TERM GOAL #4   Title Pt able to tolerate return to running progression with a normal gait pattern and no pain.    Time 8    Period Weeks    Status New    Target Date 08/26/21      PT LONG TERM GOAL #5   Title Pt able to perform floor <> stand trnasfers without difficulty    Baseline -    Time 8    Period Weeks    Status New    Target Date 08/26/21                   Plan - 08/18/21 0758     Clinical Impression Statement Pt states she has returned to work outs after being sore last week. Treatment continues to focus on quad/HS strengthening and plyometrics. She is improving tolerance to higher level activities    PT Next Visit Plan quad and HS strength, plyometrics    PT Home Exercise [email or web address]    Consulted and Agree with Plan of Care Patient             Patient will benefit from skilled therapeutic intervention in order to improve the following deficits and impairments:     Visit Diagnosis: Acute pain of left knee  Muscle weakness (generalized)     Problem List Patient Active Problem List   Diagnosis Date Noted   Left anterior cruciate ligament tear    Peripheral tear of lateral meniscus of left knee as current injury    Acute medial meniscus tear of left knee    Fever 04/18/2020   Headache 04/18/2020   Vancomycin adverse reaction, initial encounter 04/16/2020   Chronic migraine without aura without status migrainosus, not intractable 01/28/2020   Chronic pain of both knees 01/28/2020   Ankle ligament laxity, left 12/19/2019    Mayson Sterbenz, PT 08/18/2021, 8:00 AM  [facility] [address] [place], [place], [address]  Phone: [phone]   Fax:  [phone]  Name: Kathryn Anderson MRN: [id number] Date of Birth: 04-10-2000

## 2021-08-25 ENCOUNTER — Encounter: Payer: Commercial Managed Care - PPO | Admitting: Physical Therapy

## 2021-09-01 ENCOUNTER — Ambulatory Visit: Payer: Commercial Managed Care - PPO | Admitting: Physical Therapy

## 2021-09-01 DIAGNOSIS — M25662 Stiffness of left knee, not elsewhere classified: Secondary | ICD-10-CM

## 2021-09-01 DIAGNOSIS — M25562 Pain in left knee: Secondary | ICD-10-CM | POA: Diagnosis not present

## 2021-09-01 DIAGNOSIS — M6281 Muscle weakness (generalized): Secondary | ICD-10-CM

## 2021-09-01 DIAGNOSIS — R2689 Other abnormalities of gait and mobility: Secondary | ICD-10-CM

## 2021-09-01 DIAGNOSIS — Z9889 Other specified postprocedural states: Secondary | ICD-10-CM

## 2021-09-01 NOTE — Therapy (Signed)
[place], [place], [address] Phone: [phone]   Fax:  [phone]  Physical Therapy Treatment and Discharge  Patient Details  Name: Kathryn Anderson MRN: [id number] Date of Birth: 03/30/2000 Referring Provider (PT): Meredith Pel  Rationale for Evaluation and Treatment Rehabilitation  PHYSICAL THERAPY DISCHARGE SUMMARY  Visits from [facility] of Care: 8  Current functional level related to goals / functional outcomes: See below   Remaining deficits: See below   Education / Equipment: See below   Patient agrees to discharge. Patient goals were partially met. Patient is being discharged due to meeting the stated rehab goals.  Encounter Date: 09/01/2021   PT End of Session - 09/01/21 0720     Visit Number 8    Number of Visits 8    Date for PT Re-Evaluation 08/26/21    Authorization Type UHC/UMR    PT Start Time 0720    PT Stop Time 0800    PT Time Calculation (min) 40 min    Activity Tolerance Patient tolerated treatment well    Behavior During Therapy [facility] for tasks assessed/performed             Past Medical History:  Diagnosis Date   Headache     Past Surgical History:  Procedure Laterality Date   ANTERIOR CRUCIATE LIGAMENT REPAIR Left 10/05/2020   Procedure: LEFT KNEE ANTERIOR CRUCIATE LIGAMENT (ACL) RECONSTRUCTION WITH HAMSTRING AUTOGRAFT;  Surgeon: Meredith Pel, MD;  Location: [place];  Service: Orthopedics;  Laterality: Left;   KNEE ARTHROSCOPY WITH MENISCAL REPAIR Left 10/05/2020   Procedure: MEDIAL MENISCAL REPAIR;  Surgeon: Meredith Pel, MD;  Location: [place];  Service: Orthopedics;  Laterality: Left;   WISDOM TOOTH EXTRACTION      There were no vitals filed for this visit.   Subjective Assessment - 09/01/21 0720     Subjective Pt reports she went to [place] and sat 15 hours in the car (iced it). Knee did fine. Did  not do a lot of jumping. Pt notes some anterior R knee pain more than the L.    Pertinent History ACL reconstruction 10/05/20    How long can you stand comfortably? 20-30 min    Patient Stated Goals to get your knee back to normal    Currently in Pain? No/denies                [facility] PT Assessment - 09/01/21 0001       Assessment   Medical Diagnosis s/p left ACL reconstruction    Referring Provider (PT) Meredith Pel    Onset Date/Surgical Date 10/05/20      Squat   Comments Equal weight shift      Lunges   Comments Mild trendelenburg with R foot forward during lunge      Step Up   Comments WNL      Step Down   Comments WNL      Single Leg Squat   Comments WNL      Hopping   Comments WNL      Jumping   Comments WNL      Single Leg Stance   Comments 30 sec WNL      Strength   Overall Strength Comments Grossly 5/5, however mild trendelenberg with gait; left HS 4-/5; R hip abd 4/5; functional weakness in left LE see above; able to complete 10 SL heel raises left  PT Short Term Goals - 07/01/21 0915       PT SHORT TERM GOAL #1   Title Patient will be independent with initial HEP    Time 2    Status New    Target Date 07/15/21      PT SHORT TERM GOAL #2   Title -               PT Long Term Goals - 09/01/21 0723       PT LONG TERM GOAL #1   Title Pt will be independent with HEP and its progression    Time 8    Period Weeks    Status Achieved    Target Date 08/26/21      PT LONG TERM GOAL #2   Title Pt to demonstrate 5/5 left knee strength and solid functional strength in the left LE with functional tests including jumping and hopping.    Baseline -4-/5 hamstring on L    Time 8    Period Weeks    Status Partially Met    Target Date 08/26/21      PT LONG TERM GOAL #3   Title Patient able to descend stairs and stand at work without pain.    Baseline No pain with stair  descent; 3 or 4/10 pain with static standing for 30 min to 1 hour on L    Time 8    Period Weeks    Status Partially Met    Target Date 08/26/21      PT LONG TERM GOAL #4   Title Pt able to tolerate return to running progression with a normal gait pattern and no pain.    Time 8    Period Weeks    Status Achieved    Target Date 08/26/21      PT LONG TERM GOAL #5   Title Pt able to perform floor <> stand trnasfers without difficulty    Baseline -    Time 8    Period Weeks    Status Achieved    Target Date 08/26/21                   Plan - 09/01/21 0757     Clinical Impression Statement Treatment session focused on preparing pt for d/c. Functionally, pt has improved since eval. She has met or partially met all of her LTGs. L hamstring remains weaker than R; however, pt notes continued pain when trying to strengthen it. Some noted R>L hip abductor weakness. Discussed easing into more jumping and sprinting to prepare for return to volleyball.    Examination-Activity Limitations Transfers;Stairs;Stand    PT Treatment/Interventions ADLs/Self Care Home Management;Cryotherapy;Electrical Stimulation;Iontophoresis 98m/ml Dexamethasone;Moist Heat;Neuromuscular re-education;Balance training;Therapeutic exercise;Therapeutic activities;Functional mobility training;Stair training;Gait training;Patient/family education;Manual techniques;Dry needling;Taping    PT Next Visit Plan quad and HS strength, plyometrics    PT Home Exercise Plan [id number]    Consulted and Agree with Plan of Care Patient             Patient will benefit from skilled therapeutic intervention in order to improve the following deficits and impairments:  Abnormal gait, Increased muscle spasms, Pain, Impaired flexibility, Decreased strength, Decreased balance  Visit Diagnosis: Acute pain of left knee  Muscle weakness (generalized)  Stiffness of left knee, not elsewhere classified  Other abnormalities of gait  and mobility  S/P ACL reconstruction     Problem List Patient Active Problem List   Diagnosis Date Noted   Left anterior cruciate  ligament tear    Peripheral tear of lateral meniscus of left knee as current injury    Acute medial meniscus tear of left knee    Fever 04/18/2020   Headache 04/18/2020   Vancomycin adverse reaction, initial encounter 04/16/2020   Chronic migraine without aura without status migrainosus, not intractable 01/28/2020   Chronic pain of both knees 01/28/2020   Ankle ligament laxity, left 12/19/2019    [facility] April Ma L Anthone Prieur, PT, DPT 09/01/2021, [place], [place], [address] Phone: [phone]   Fax:  [phone]  Name: Kathryn Anderson MRN: [id number] Date of Birth: April 15, 2000

## 2021-09-09 ENCOUNTER — Encounter: Payer: Self-pay | Admitting: Orthopedic Surgery

## 2021-09-09 ENCOUNTER — Ambulatory Visit (INDEPENDENT_AMBULATORY_CARE_PROVIDER_SITE_OTHER): Payer: Commercial Managed Care - PPO | Admitting: Orthopedic Surgery

## 2021-09-09 DIAGNOSIS — Z9889 Other specified postprocedural states: Secondary | ICD-10-CM

## 2021-09-09 NOTE — Progress Notes (Signed)
Office Visit Note   Patient: Kathryn Anderson           Date of Birth: 2000/08/18           MRN: [id number] Visit Date: 09/09/2021 Requested by: Lavada Mesi, MD [address] [place],  [place] [address] PCP: Lavada Mesi, MD  Subjective: Chief Complaint  Patient presents with   Left Knee - Routine Post Op    HPI: Patient presents now 11 months out ACL reconstruction with medial meniscal repair.  Patient finished physical therapy.  She has started playing pickle ball for the last 2 weeks.  Would like to play volleyball sometime.  Overall feeling well.  Occasionally has some pain at rest with prolonged sitting or prolonged standing.  No medications.  Still works at [employer].              ROS: All systems reviewed are negative as they relate to the chief complaint within the history of present illness.  Patient denies  fevers or chills.   Assessment & Plan: Visit Diagnoses:  1. S/P ACL reconstruction     Plan: Impression is patient is doing well following ACL reconstruction.  She has excellent quad and hamstring strength with only about 1 cm atrophy.  Recommend 1 more month of pickleball just to get her motion and muscle memory back.  We will prescribe ACL brace per patient request which is a custom brace so she can return to volleyball.  Follow-up with [place] as needed.  Follow-Up Instructions: Return if symptoms worsen or fail to improve.   Orders:  No orders of the defined types were placed in this encounter.  No orders of the defined types were placed in this encounter.     Procedures: No procedures performed   Clinical Data: No additional findings.  Objective: Vital Signs: There were no vitals taken for this visit.  Physical Exam:   Constitutional: Patient appears well-developed HEENT:  Head: Normocephalic Eyes:EOM are normal Neck: Normal range of motion Cardiovascular: Normal rate Pulmonary/chest: Effort normal Neurologic: Patient is  alert Skin: Skin is warm Psychiatric: Patient has normal mood and affect   Ortho Exam: Ortho exam demonstrates full active and passive range of motion of the left knee.  Collateral and cruciate ligaments are stable.  Has about 1 to 2 mm anterior drawer with solid endpoint.  Hamstring strength has improved so that I cannot really tell the difference on manual muscle testing.  No partial rotatory instability.  No real medial joint line tenderness.  Specialty Comments:  No specialty comments available.  Imaging: No results found.   PMFS History: Patient Active Problem List   Diagnosis Date Noted   Left anterior cruciate ligament tear    Peripheral tear of lateral meniscus of left knee as current injury    Acute medial meniscus tear of left knee    Fever 04/18/2020   Headache 04/18/2020   Vancomycin adverse reaction, initial encounter 04/16/2020   Chronic migraine without aura without status migrainosus, not intractable 01/28/2020   Chronic pain of both knees 01/28/2020   Ankle ligament laxity, left 12/19/2019   Past Medical History:  Diagnosis Date   Headache     Family History  Problem Relation Age of Onset   Healthy Mother    Healthy Father    Macular degeneration Maternal Grandmother    Glaucoma Maternal Grandmother    Dementia Paternal Grandfather    Heart disease Neg Hx     Past Surgical History:  Procedure Laterality Date   ANTERIOR CRUCIATE LIGAMENT REPAIR Left 10/05/2020   Procedure: LEFT KNEE ANTERIOR CRUCIATE LIGAMENT (ACL) RECONSTRUCTION WITH HAMSTRING AUTOGRAFT;  Surgeon: Cammy Copa, MD;  Location: [facility];  Service: Orthopedics;  Laterality: Left;   KNEE ARTHROSCOPY WITH MENISCAL REPAIR Left 10/05/2020   Procedure: MEDIAL MENISCAL REPAIR;  Surgeon: Cammy Copa, MD;  [facility];  Service: Orthopedics;  Laterality: Left;   WISDOM TOOTH EXTRACTION     Social History   Occupational History   Not on file   Tobacco Use   Smoking status: Never   Smokeless tobacco: Never  Vaping Use   Vaping Use: Never used  Substance and Sexual Activity   Alcohol use: Never   Drug use: Never   Sexual activity: Not on file

## 2022-03-01 IMAGING — CR DG CHEST 2V
2 series · 2 of 2 positions shown · non-contrast
Comparison: None.

CLINICAL DATA: Headache since yesterday, fever

EXAM:
CHEST - 2 VIEW

[w chest lat]
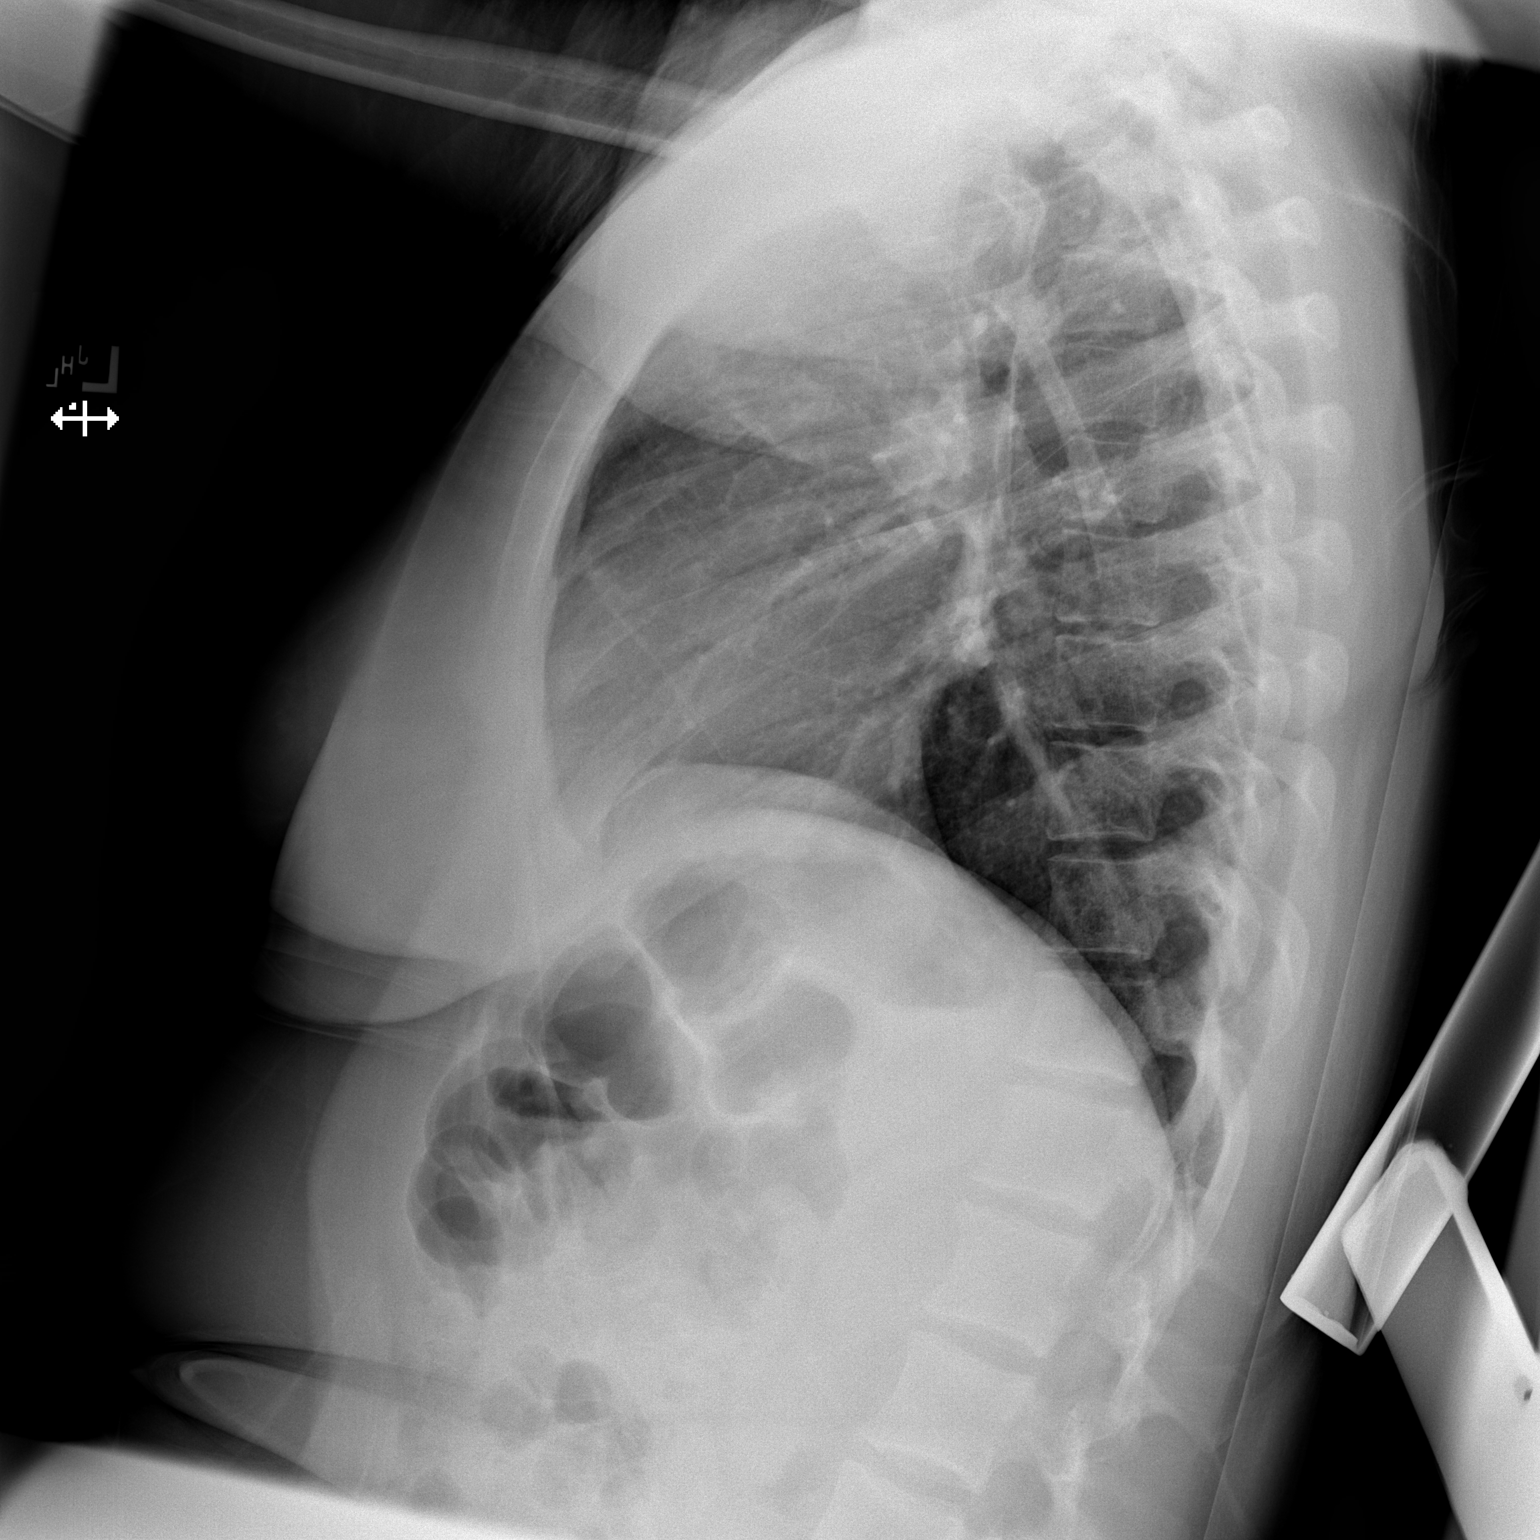

[x chest ap]
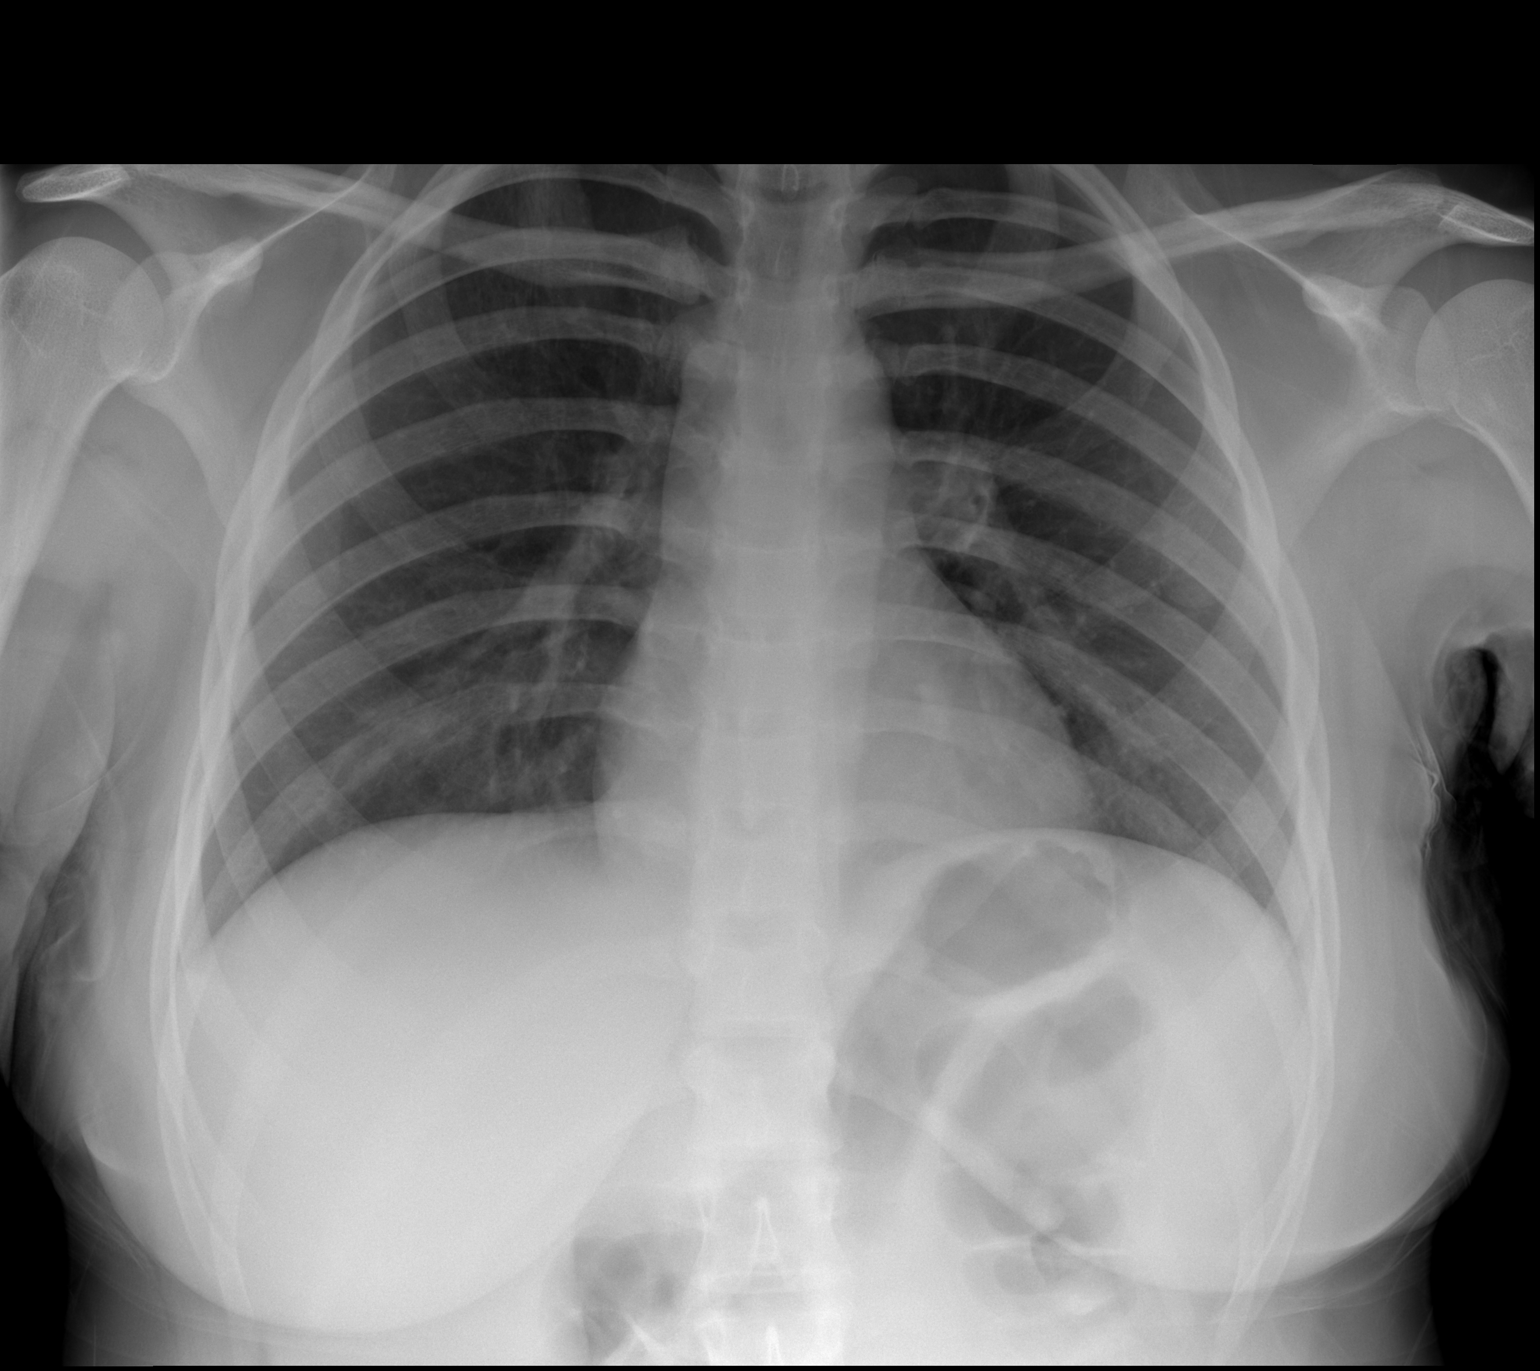

[2 of 2 positions shown; findings below may reference images not displayed]

FINDINGS: Frontal and lateral views of the chest demonstrate an unremarkable
cardiac silhouette. No airspace disease, effusion, or pneumothorax.
No acute bony abnormality.
IMPRESSION: 1. No acute intrathoracic process.

## 2022-03-24 IMAGING — MR MR HEAD W/O CM
10 series · 48 of 48 positions shown · non-contrast
Comparison: No pertinent prior exams available for comparison.

CLINICAL DATA: Primary stabbing headache. Headache, chronic, no new
features; dizziness, nonspecific. Additional history provided by
scanning technologist: Patient reports headaches, dizziness and
nausea for 2-3 weeks.

EXAM:
MRI HEAD WITHOUT CONTRAST
TECHNIQUE: Multiplanar, multiecho pulse sequences of the brain and surrounding
structures were obtained without intravenous contrast.

[Series 2: T1 · sagittal · 5.0mm · 0.45mm/px · 1 of 21 slices shown]
[im 1/21]
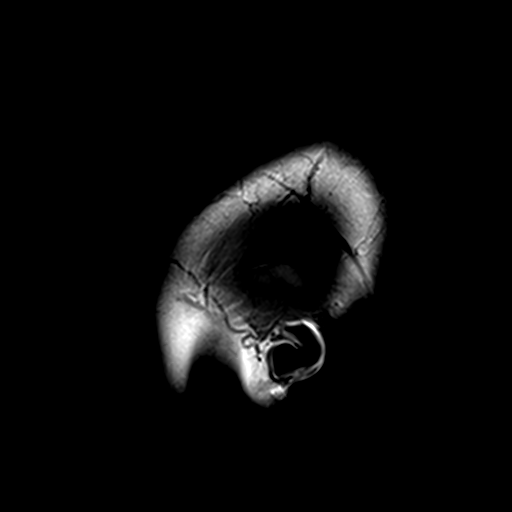

[Series 3: DWI · axial · 3.0mm · 1.80mm/px · z∈[-65,+81]mm · 9 of 100 slices shown (1 of 4)]
[im 1/100]
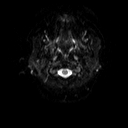
[im 13/100]
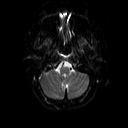
[im 25/100]
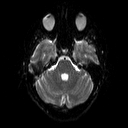
[im 38/100]
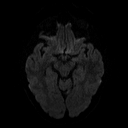
[im 50/100]
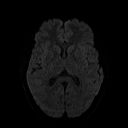
[im 62/100]
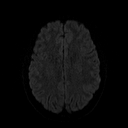
[im 75/100]
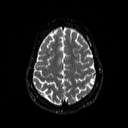
[im 87/100]
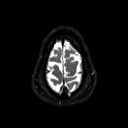
[im 100/100]
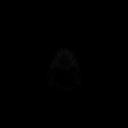

[Series 4: DWI · axial · 3.0mm · 1.80mm/px · z∈[-65,+81]mm · 4 of 47 slices shown (2 of 4)]
[im 1/47]
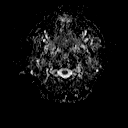
[im 16/47]
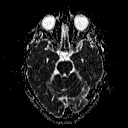
[im 31/47]
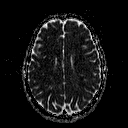
[im 47/47]
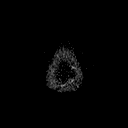

[Series 5: DWI · coronal · 5.0mm · 1.80mm/px · 7 of 76 slices shown (3 of 4)]
[im 1/76]
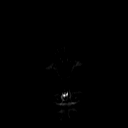
[im 13/76]
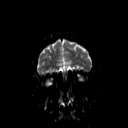
[im 26/76]
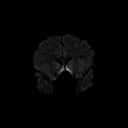
[im 38/76]
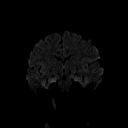
[im 51/76]
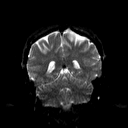
[im 63/76]
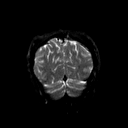
[im 76/76]
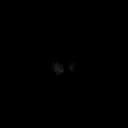

[Series 6: DWI · coronal · 5.0mm · 1.80mm/px · 3 of 38 slices shown (4 of 4)]
[im 1/38]
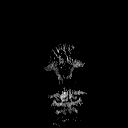
[im 19/38]
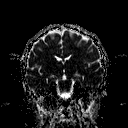
[im 38/38]
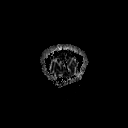

[Series 7: T2 · axial · 5.0mm · 0.51mm/px · z∈[-67,+79]mm · 2 of 22 slices shown (1 of 2)]
[im 1/22]
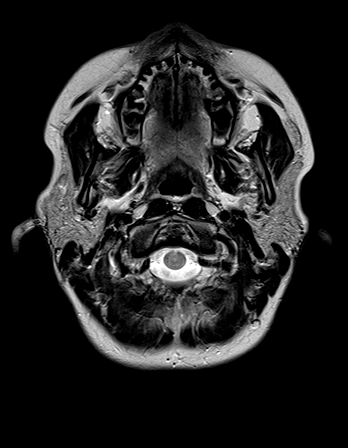
[im 22/22]
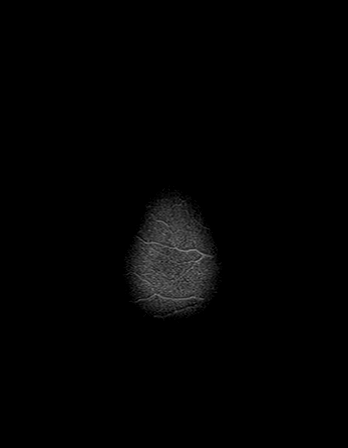

[Series 8: FLAIR · axial · 3.0mm · 0.45mm/px · z∈[-62,+72]mm · 3 of 30 slices shown]
[im 1/30]
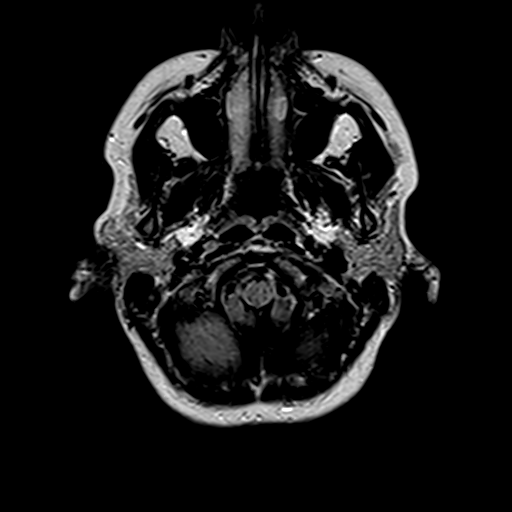
[im 15/30]
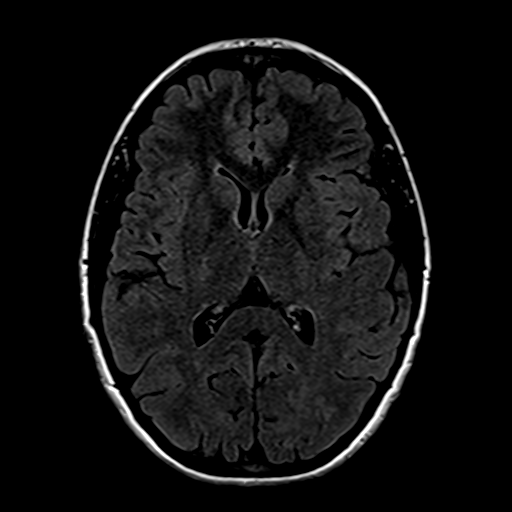
[im 30/30]
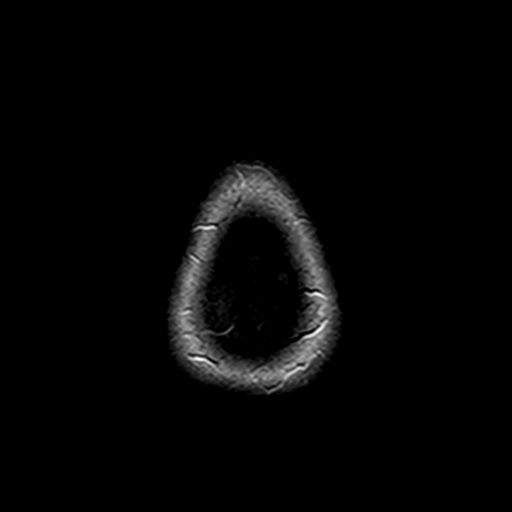

[Series 10: swi_images · axial · 4.0mm · 0.90mm/px · z∈[-64,+75]mm · 3 of 36 slices shown]
[im 1/36]
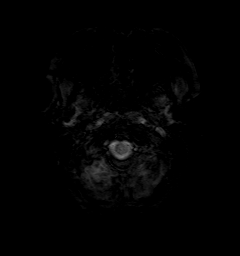
[im 18/36]
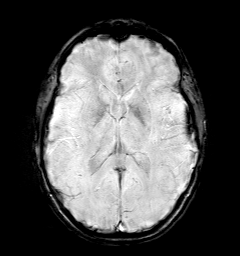
[im 36/36]
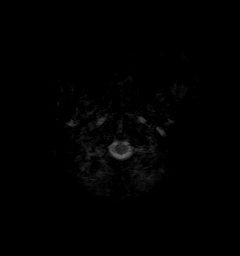

[Series 11: t1_mpr_tra · axial · 1.0mm · 0.71mm/px · z∈[-64,+77]mm · 13 of 144 slices shown]
[im 1/144]
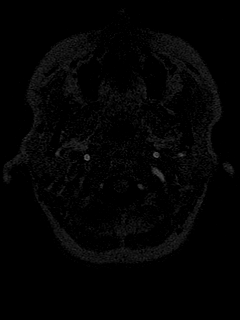
[im 12/144]
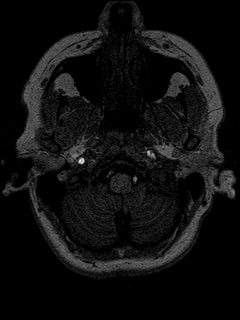
[im 24/144]
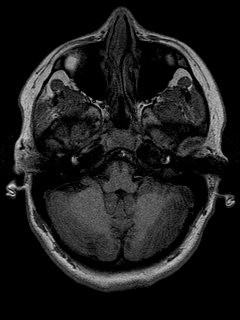
[im 36/144]
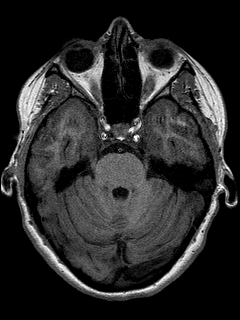
[im 48/144]
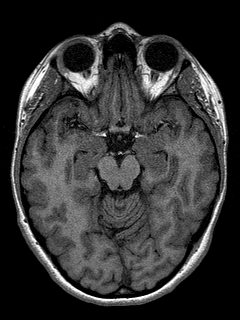
[im 60/144]
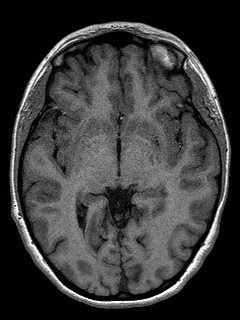
[im 72/144]
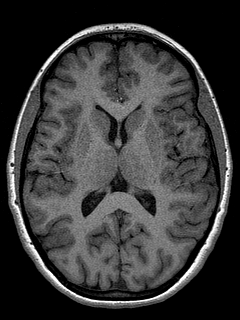
[im 84/144]
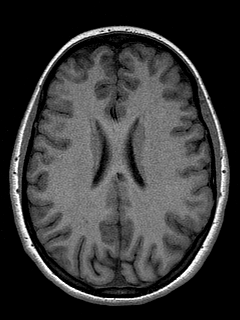
[im 96/144]
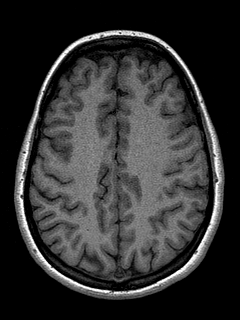
[im 108/144]
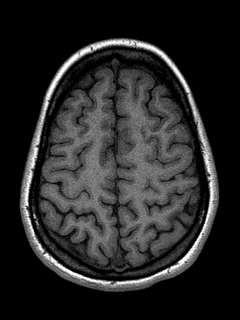
[im 120/144]
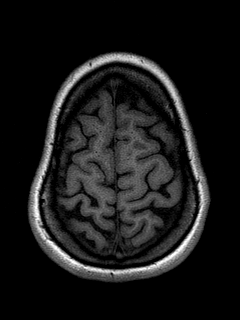
[im 132/144]
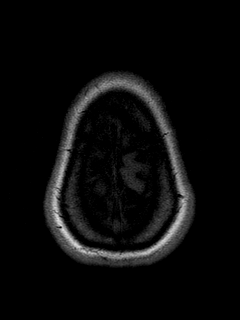
[im 144/144]
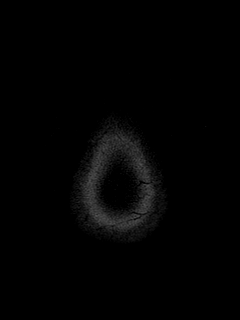

[Series 12: T2 · coronal · 5.0mm · 0.45mm/px · 3 of 30 slices shown (2 of 2)]
[im 1/30]
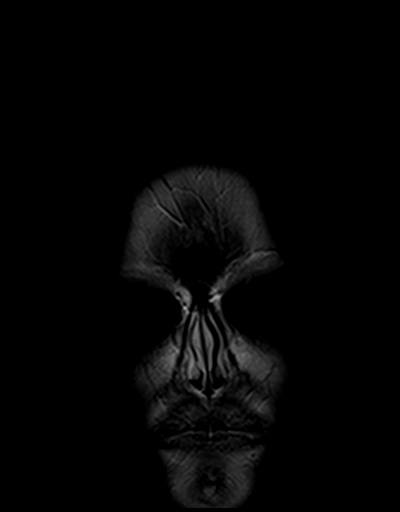
[im 15/30]
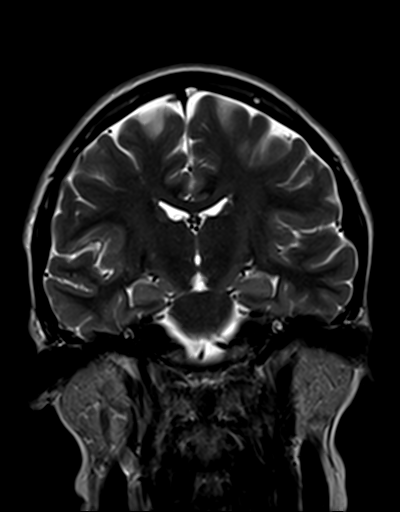
[im 30/30]
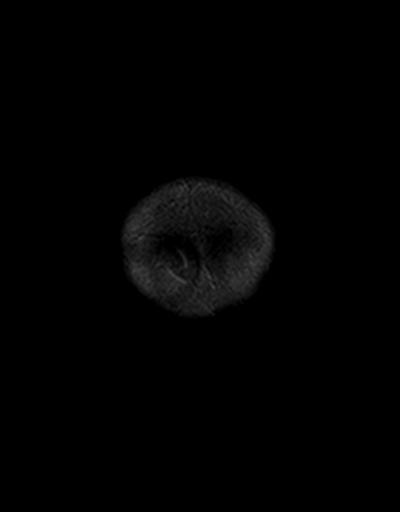

[48 of 48 positions shown; findings below may reference images not displayed]

FINDINGS: Brain:

Cerebral volume is normal.

No focal parenchymal signal abnormality is identified.

There is no acute infarct.

No evidence of intracranial mass.

No chronic intracranial blood products.

No extra-axial fluid collection.

No midline shift.

Vascular: Expected proximal arterial flow voids.

Skull and upper cervical spine: No focal marrow lesion.

Sinuses/Orbits: Visualized orbits show no acute finding. Small left
maxillary sinus mucous retention cyst.

Other: Trace fluid within the right mastoid air cells.
IMPRESSION: Unremarkable non-contrast MRI appearance of the brain. No evidence
of acute intracranial abnormality.

Small left maxillary sinus mucous retention cyst.

Trace fluid within the right mastoid air cells.

## 2022-05-02 ENCOUNTER — Emergency Department (HOSPITAL_BASED_OUTPATIENT_CLINIC_OR_DEPARTMENT_OTHER)
Admission: EM | Admit: 2022-05-02 | Discharge: 2022-05-02 | Disposition: A | Discharge status: Home / Self Care | Payer: Commercial Managed Care - PPO | Attending: Emergency Medicine | Admitting: Emergency Medicine

## 2022-05-02 ENCOUNTER — Emergency Department (HOSPITAL_BASED_OUTPATIENT_CLINIC_OR_DEPARTMENT_OTHER): Payer: Commercial Managed Care - PPO

## 2022-05-02 ENCOUNTER — Other Ambulatory Visit: Payer: Self-pay

## 2022-05-02 DIAGNOSIS — R0789 Other chest pain: Secondary | ICD-10-CM | POA: Insufficient documentation

## 2022-05-02 DIAGNOSIS — R079 Chest pain, unspecified: Secondary | ICD-10-CM | POA: Diagnosis present

## 2022-05-02 NOTE — ED Notes (Signed)
Patient verbalizes understanding of discharge instructions. Opportunity for questioning and answers were provided. Patient discharged from ED.  °

## 2022-05-02 NOTE — ED Triage Notes (Signed)
Pt arrived POV, caox4, NAD. Pt reports she was at work when she had a sudden onset sharp CP in the center of the chest approx 30 min ago with SOB, and tingling in both arms. Pain on onset was 7/10 but states it has decreased to a dull 4/10 at present. Denies cardiac, pulmonary hx, no hx dvt/PE.

## 2022-05-02 NOTE — ED Provider Notes (Signed)
[facility] Provider Note   CSN: [id number] Arrival date & time: 05/02/22  1037     History  Chief Complaint  Patient presents with   Chest Pain    Kathryn Anderson is a 22 y.o. female.  Patient here after episode of palpitations and chest discomfort and shortness of breath.  Now resolved.  She was at work when she had the sudden symptoms.  No recent surgery or travel.  No cardiac history.  No asthma history.  No viral illness symptoms.  She has been under a lot of stress.  Denies any weakness, numbness, chills.  The history is provided by the patient.       Home Medications Prior to Admission medications   Medication Sig Start Date End Date Taking? Authorizing Provider  cholecalciferol (VITAMIN D3) 25 MCG (1000 UNIT) tablet Take 1,000 Units by mouth daily.    [provider]  docusate sodium (COLACE) 100 MG capsule Take 1 capsule (100 mg total) by mouth 2 (two) times daily as needed for mild constipation. Patient not taking: Reported on 11/06/2020 10/14/20   Meredith Pel, MD  fluticasone (FLOVENT HFA) 44 MCG/ACT inhaler 2 Puffs BID x 1 month, then 1 Puff BID x 1 month Patient not taking: Reported on 11/06/2020 09/22/20   Hilts, Legrand Como, MD  HYDROcodone-acetaminophen (NORCO/VICODIN) 5-325 MG tablet Take every 12 to 24 hours as needed for severe pain. Patient not taking: Reported on 11/06/2020 10/21/20   Magnant, Gerrianne Scale, PA-C  ibuprofen (ADVIL) 200 MG tablet Take 200 mg by mouth every 6 (six) hours as needed for headache or mild pain.    [provider]  Iron-Vitamin C (IRON 100/C PO) Take by mouth.    [provider]  magnesium 30 MG tablet Take 30 mg by mouth at bedtime.    [provider]  methocarbamol (ROBAXIN) 500 MG tablet Take 1 tablet (500 mg total) by mouth every 8 (eight) hours as needed for muscle spasms. Patient not taking: Reported on 11/06/2020 10/05/20   Meredith Pel, MD   methocarbamol (ROBAXIN) 500 MG tablet Take 1 tablet (500 mg total) by mouth 3 (three) times daily. Patient not taking: Reported on 11/06/2020 10/14/20   Meredith Pel, MD  montelukast (SINGULAIR) 10 MG tablet Take 0.5-1 tablets (5-10 mg total) by mouth at bedtime as needed. Patient not taking: Reported on 11/06/2020 05/28/20   Hilts, Legrand Como, MD  promethazine (PHENERGAN) 12.5 MG tablet Take 0.5-1 tablets (6.25-12.5 mg total) by mouth every 6 (six) hours as needed for nausea or vomiting. Patient not taking: Reported on 11/06/2020 10/06/20   Meredith Pel, MD  vitamin C (ASCORBIC ACID) 500 MG tablet Take 500 mg by mouth daily.    [provider]      Allergies    Vancomycin    Review of Systems   Review of Systems  Physical Exam Updated Vital Signs BP (!) 129/99 (BP Location: Right Arm)   Pulse 99   Temp 98 F (36.7 C) (Oral)   Resp (!) 22   SpO2 100%  Physical Exam Vitals and nursing note reviewed.  Constitutional:      General: She is not in acute distress.    Appearance: She is well-developed. She is not ill-appearing.  HENT:     Head: Normocephalic and atraumatic.     Mouth/Throat:     Mouth: Mucous membranes are moist.  Eyes:     Extraocular Movements: Extraocular movements intact.  Conjunctiva/sclera: Conjunctivae normal.     Pupils: Pupils are equal, round, and reactive to light.  Cardiovascular:     Rate and Rhythm: Normal rate and regular rhythm.     Pulses: Normal pulses.     Heart sounds: Normal heart sounds. No murmur heard. Pulmonary:     Effort: Pulmonary effort is normal. No respiratory distress.     Breath sounds: Normal breath sounds. No decreased breath sounds, wheezing or rhonchi.  Abdominal:     Palpations: Abdomen is soft.     Tenderness: There is no abdominal tenderness.  Musculoskeletal:        General: No swelling.     Cervical back: Neck supple.  Skin:    General: Skin is warm and dry.     Capillary Refill: Capillary refill  takes less than 2 seconds.  Neurological:     General: No focal deficit present.     Mental Status: She is alert.  Psychiatric:        Mood and Affect: Mood normal.     ED Results / Procedures / Treatments   Labs (all labs ordered are listed, but only abnormal results are displayed) Labs Reviewed - No data to display   EKG EKG Interpretation  Date/Time:  Monday May 02 2022 10:44:03 EST Ventricular Rate:  99 PR Interval:  145 QRS Duration: 92 QT Interval:  335 QTC Calculation: 430 R Axis:   79 Text Interpretation: Sinus rhythm Confirmed by Lennice Sites ([id number]) on 05/02/2022 11:03:27 AM  Radiology DG Chest Portable 1 View  Result Date: 05/02/2022 CLINICAL DATA:  Shortness of breath and chest pain.  Nausea. EXAM: PORTABLE CHEST 1 VIEW COMPARISON:  05/20/2021 FINDINGS: Heart size and mediastinal contours are unremarkable. No pleural effusion or edema. No airspace opacities identified. Visualized osseous structures are unremarkable. IMPRESSION: No active disease. Electronically Signed   By: Kerby Moors M.D.   On: 05/02/2022 10:59    Procedures Procedures    Medications Ordered in ED Medications - No data to display  ED Course/ Medical Decision Making/ A&P                             Medical Decision Making Amount and/or Complexity of Data Reviewed Radiology: ordered.   Kathryn Anderson is here after episode of palpitations and chest pain and shortness of breath.  Now resolved.  Normal vitals.  No fever.  She looks mildly anxious on exam.  She does endorse some anxiety.  She has no CAD risk factors.  Heart score 0.  EKG per my review interpretation shows sinus rhythm.  No ischemic changes.  Chest x-ray negative for pneumonia or pneumothorax.  I have no concern for PE.  She has no PE risk factors.  She felt better after some rest in the ED.  Recommend rest and hydration and self-care at home.  She understands return precautions.  Discharged in good condition.  This  chart was dictated using voice recognition software.  Despite best efforts to proofread,  errors can occur which can change the documentation meaning.         Final Clinical Impression(s) / ED Diagnoses Final diagnoses:  Atypical chest pain    Rx / DC Orders ED Discharge Orders     None         Lennice Sites, DO 05/02/22 1115

## 2023-02-20 ENCOUNTER — Other Ambulatory Visit: Payer: Self-pay | Admitting: Family Medicine

## 2023-02-20 ENCOUNTER — Ambulatory Visit
Admission: RE | Admit: 2023-02-20 | Discharge: 2023-02-20 | Disposition: A | Discharge status: Home / Self Care | Payer: BC Managed Care – PPO | Source: Ambulatory Visit | Attending: Family Medicine | Admitting: Family Medicine

## 2023-02-20 DIAGNOSIS — R051 Acute cough: Secondary | ICD-10-CM
